# Patient Record
Sex: Male | Born: 1950 | Race: Black or African American | Hispanic: No | Marital: Single | Smoking: Never smoker
Health system: Southern US, Community
[De-identification: ages and names within clinical notes are randomized; demographics above are authoritative.]

## PROBLEM LIST (undated history)

## (undated) DIAGNOSIS — I10 Essential (primary) hypertension: Secondary | ICD-10-CM

## (undated) DIAGNOSIS — E669 Obesity, unspecified: Secondary | ICD-10-CM

## (undated) DIAGNOSIS — I82409 Acute embolism and thrombosis of unspecified deep veins of unspecified lower extremity: Secondary | ICD-10-CM

## (undated) DIAGNOSIS — E785 Hyperlipidemia, unspecified: Secondary | ICD-10-CM

## (undated) DIAGNOSIS — F0390 Unspecified dementia without behavioral disturbance: Secondary | ICD-10-CM

## (undated) DIAGNOSIS — F79 Unspecified intellectual disabilities: Secondary | ICD-10-CM

## (undated) HISTORY — DX: Acute embolism and thrombosis of unspecified deep veins of unspecified lower extremity: I82.409

## (undated) HISTORY — DX: Obesity, unspecified: E66.9

## (undated) HISTORY — DX: Unspecified intellectual disabilities: F79

## (undated) HISTORY — DX: Hyperlipidemia, unspecified: E78.5

## (undated) HISTORY — DX: Essential (primary) hypertension: I10

---

## 2007-07-10 ENCOUNTER — Ambulatory Visit: Payer: Self-pay | Admitting: Physician Assistant

## 2007-07-12 ENCOUNTER — Ambulatory Visit: Payer: Self-pay | Admitting: Cardiology

## 2007-07-17 ENCOUNTER — Encounter: Admission: RE | Admit: 2007-07-17 | Discharge: 2007-07-17 | Payer: Self-pay | Admitting: Internal Medicine

## 2007-07-28 ENCOUNTER — Ambulatory Visit: Payer: Self-pay | Admitting: Cardiology

## 2011-02-23 NOTE — Assessment & Plan Note (Signed)
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  The patient  referred for cardiac evaluation.  The patient was seen by Lorin Picket  recently.  The patient was found to have some ankle edema.  An  echocardiogram was ordered.  The echocardiograph study, however, was  within normal limits and there was no evidence of significant pulmonary  hypertension.  The ejection fraction was 55 to 60%.  The patient reports  to me that he has no substernal chest pain, he has no shortness of  breath, he has no orthopnea or PND.  He states that he is active in the  group home.  He plays occasional basketball and states that he does not  get short of breath.   MEDICATIONS:  1. Carvedilol 6.25 mg p.o. daily.  2. Lisinopril 20 mg p.o. daily.  3. Furosemide 40 mg p.o. daily.  4. Amlodipine 5 mg p.o. daily.  5. Aspirin 81 mg p.o. daily.  6. Iron 150 mg p.o. daily.   PHYSICAL EXAMINATION:  VITAL SIGNS:  Blood pressure is 113/79, heart  rate is 62 bpm, weight is 227 pounds.  NECK EXAM:  Normal carotid upstroke, no carotid bruits.  LUNGS:  Clear breath sounds bilaterally.  HEART:  Regular rate and rhythm, normal S1 and S2.  No murmurs, rubs or  gallops.  ABDOMEN:  Soft.  EXTREMITY EXAM:  No cyanosis or clubbing.  There is edema of the lower  extremities but this appears to be nonpitting.   PROBLEM LIST:  1. Nonpitting edema of the lower extremities, rule out venous      insufficiency.  2. Hypertension, controlled.  3. Normal left ventricular systolic function.   PLAN:  1. I may not change the patient's medical regimen at this point.  I do      not  think he has any major cardiac issues.  2. The patient's lower extremity edema appears to be nonpitting and      does not appear to be cardiac in nature.     Learta Codding, MD,FACC  Electronically Signed    GED/MedQ  DD: 07/28/2007  DT: 07/30/2007  Job #: [id number]   cc:   Prescott Parma

## 2011-02-23 NOTE — Assessment & Plan Note (Signed)
[facility]                          [facility] NOTE   NAME:Codrington, Nazaire                        MRN:          [id number]  DATE:07/10/2007                            DOB:          27-Aug-1951    REFERRING PHYSICIAN:  Prescott Parma   REFERRING PHYSICIAN:  Prescott Parma, M.D.   REASON FOR REFERRAL:  Cardiac evaluation.   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  Unfortunately, we do not know much about  his history.  He was recently staying with his mother and has been  transferred to [facility] in [place], [place].  He is  on multiple cardiac medications and does have peripheral edema.  The  group home is working on trying to get records and further information  on his past medical history.   The patient is a poor historian.  He answers no to most questions.  When questioned about chest pain, he denies.  He denies much shortness  of breath.  He does tell me that he walks quite a bit and also plays  basketball without any limitations.  He denies syncope or near syncope.  He denies PND or orthopnea.   PAST MEDICAL HISTORY:  Essentially unknown.   MEDICATIONS:  1. Carvedilol 6.25 mg daily.  2. Lisinopril 20 mg daily.  3. Furosemide 40 mg daily.  4. Amlodipine 5 mg daily.  5. Back lotion apply p.r.n.  6. Aspirin 81 mg daily.   ALLERGIES:  NO KNOWN DRUG ALLERGIES.   SOCIAL HISTORY:  Patient denies tobacco or alcohol abuse.   FAMILY HISTORY:  Essentially unknown.   REVIEW OF SYSTEMS:  Please see HPI.  Again, review of systems are  difficult to obtain.  He does deny fevers, chills, blood in his stools,  blood in his urine.  He does note that he does urinate quite frequently.  The rest of the review of systems are negative.   PHYSICAL EXAMINATION:  VITAL SIGNS:  Blood pressure 110/72,  pulse 83,  weight was not taken.  GENERAL APPEARANCE:  He is a well-nourished, well-developed man in no  acute distress.  HEENT:  Normal.  NECK:  Without JVD at 45 degrees.  LUNGS:  Clear to auscultation bilaterally without wheezing, rhonchi or  rales.  CARDIOVASCULAR:  Normal S1 and S2, regular rate and rhythm without  murmur.  ABDOMEN:  Soft and nontender with normal active bowel sounds, no  organomegaly.  EXTREMITIES:  There is 1 to 2+ ankle edema bilaterally.  Calves are soft  and nontender.  SKIN:  Warm and dry.  NEUROLOGIC:  He is alert and oriented x3.  Cranial nerves II-XII grossly  intact.  VASCULAR:  Carotid pulses without bruits bilaterally.   Electrocardiogram revealed sinus rhythm with a heart rate of 75, normal  axis, first degree AV block with a PR interval of 210 msec, no ischemic  changes.   IMPRESSION:  The patient is a 60 year old male patient with moderate to  severe mental retardation who has an unknown cardiac history but is on  multiple cardiac medications.  He does have peripheral edema on exam.  His lungs sound fairly clear.  There are no Q-waves on his  electrocardiogram.  At this point in time, records are still pending.   PLAN:  At this point in time we plan to try to obtain records on his  past medical history.  We will also send him over for an echocardiogram  to assess his left ventricular function and to rule out wall motion  abnormalities as well as valvular abnormalities.  Will try to obtain  labs from Dr. Rollen Sox office which were apparently drawn recently.  Will make sure that the patient's carvedilol is taken twice daily at his  group home.  I discussed the above assessment and plan today with Dr.  Andee Lineman who agreed.  I will bring him back in close follow-up with Dr.  Andee Lineman in the next couple of weeks.   At this point in time, I do not believe that the patient would be able  to tolerate stress testing or cardiac catheterization given his  mental  deficits.  He essentially is asymptomatic at this point in time.  We  will await the results of his echocardiogram  to see what his cardiac  status is before we make further decisions.      Tereso Newcomer, PA-C  Electronically Signed      Learta Codding, MD,FACC  Electronically Signed   SW/MedQ  DD: 07/10/2007  DT: 07/10/2007  Job #: [id number]

## 2011-07-09 ENCOUNTER — Ambulatory Visit (INDEPENDENT_AMBULATORY_CARE_PROVIDER_SITE_OTHER): Payer: Medicare Other | Admitting: Cardiovascular Disease

## 2011-07-09 ENCOUNTER — Other Ambulatory Visit: Payer: Self-pay | Admitting: Cardiovascular Disease

## 2011-07-09 ENCOUNTER — Encounter: Payer: Self-pay | Admitting: *Deleted

## 2011-07-09 VITALS — BP 90/72 | HR 94 | Ht 72.0 in | Wt 201.0 lb

## 2011-07-09 DIAGNOSIS — R609 Edema, unspecified: Secondary | ICD-10-CM

## 2011-07-09 DIAGNOSIS — I10 Essential (primary) hypertension: Secondary | ICD-10-CM | POA: Insufficient documentation

## 2011-07-09 DIAGNOSIS — R600 Localized edema: Secondary | ICD-10-CM | POA: Insufficient documentation

## 2011-07-09 NOTE — Progress Notes (Signed)
HPI  This is a 60 year old male who is referred for evaluation of lower extremity edema. The patient had significant mental retardation and dementia. He was seen in the past by Dr. Andee Lineman in 2008 for the same problem. He had an echocardiogram done at that time which showed normal LV systolic function and no evidence of pulmonary hypertension. The patient is not able to provide any history. According to the caregiver, his dementia has actually worsened and he is in the process to be moved to an institution instead of a group home. The patient had a swelling in both legs for many years but worse on the right side. It's continuous and slightly worse at the end of the day. There's reported previous history of right leg DVT without much details. The patient himself denies any chest pain or dyspnea.  No Known Allergies   No current outpatient prescriptions on file prior to visit.     Past Medical History  Diagnosis Date  . Mental retardation   . DVT (deep venous thrombosis)     right leg  . Dyslipidemia   . Obesity   . Hypertension      History reviewed. No pertinent past surgical history.   Family History  Problem Relation Age of Onset  . Hypertension       History   Social History  . Marital Status: Single    Spouse Name: N/A    Number of Children: N/A  . Years of Education: N/A   Occupational History  . Not on file.   Social History Main Topics  . Smoking status: Never Smoker   . Smokeless tobacco: Never Used  . Alcohol Use: Not on file  . Drug Use: Not on file  . Sexually Active: Not on file   Other Topics Concern  . Not on file   Social History Narrative  . No narrative on file     ROS Unable to obtain due to his dementia.  PHYSICAL EXAM   BP 90/72  Pulse 94  Ht 6' (1.829 m)  Wt 201 lb (91.173 kg)  BMI 27.26 kg/m2  SpO2 95%  Constitutional: He is oriented to person, place, and time. He appears well-developed and well-nourished. No distress.  HENT:  No nasal discharge.  Head: Normocephalic and atraumatic.  Eyes: Pupils are equal, round, and reactive to light. Right eye exhibits no discharge. Left eye exhibits no discharge.  Neck: Normal range of motion. Neck supple. No JVD present. No thyromegaly present.  Cardiovascular: Normal rate, regular rhythm, normal heart sounds and intact distal pulses. Exam reveals no gallop and no friction rub.  No murmur heard.  Pulmonary/Chest: Effort normal and breath sounds normal. No stridor. No respiratory distress. He has no wheezes. He has no rales. He exhibits no tenderness.  Abdominal: Soft. Bowel sounds are normal. He exhibits no distension. There is no tenderness. There is no rebound and no guarding.  Musculoskeletal: Normal range of motion. There is +2 edema with pitting and nonpitting component worse on the right side and associated with significant stasis dermatitis. No significant tenderness.  Neurological: He is alert and oriented to person, place, and time. Coordination normal.  Skin: Skin is warm and dry. No rash noted. He is not diaphoretic. No erythema. No pallor.  Psychiatric: He has a normal mood and affect. His behavior is normal. Judgment and thought content normal.      EKG: Normal sinus rhythm with no significant ST or T wave changes.   ASSESSMENT AND PLAN

## 2011-07-09 NOTE — Patient Instructions (Addendum)
Your follow up will be based on your test results.  Your physician recommends that you continue on your current medications as directed. Please refer to the Current Medication list given to you today. Your physician has requested that you have an echocardiogram. Echocardiography is a painless test that uses sound waves to create images of your heart. It provides your doctor with information about the size and shape of your heart and how well your heart's chambers and valves are working. This procedure takes approximately one hour. There are no restrictions for this procedure. Your physician has requested that you have a lower or upper extremity venous duplex. This test is an ultrasound of the veins in the legs or arms. It looks at venous blood flow that carries blood from the heart to the legs or arms. Allow one hour for a Lower Venous exam. Allow thirty minutes for an Upper Venous exam. There are no restrictions or special instructions. Support stockings-Do not wear these until after ultrasound of legs is complete. If the results of your test are normal or stable, you will receive a letter. If they are abnormal, the nurse will contact you by phone.

## 2011-07-09 NOTE — Assessment & Plan Note (Signed)
The patient has bilateral leg edema worse on the right side than the left with significant stasis dermatitis. I suspect chronic venous insufficiency. However, due to his limited mobility I think we need to rule out chronic DVTs. Thus, I recommend bilateral venous ultrasound Doppler for further evaluation. I will also obtain an echocardiogram to evaluate his LV systolic function and pulmonary pressure. If these 2 tests do not show significant abnormalities, I recommend knee-high support stockings with frequent leg elevation during the day. The patient does not report any other cardiac symptoms and there is no indication for ischemic cardiac evaluation.

## 2011-07-09 NOTE — Assessment & Plan Note (Signed)
His blood pressure is somewhat low but he does not report any symptoms.

## 2011-07-22 ENCOUNTER — Other Ambulatory Visit (INDEPENDENT_AMBULATORY_CARE_PROVIDER_SITE_OTHER): Payer: Medicare Other | Admitting: *Deleted

## 2011-07-22 DIAGNOSIS — R609 Edema, unspecified: Secondary | ICD-10-CM

## 2011-07-27 ENCOUNTER — Telehealth: Payer: Self-pay | Admitting: *Deleted

## 2011-07-27 NOTE — Telephone Encounter (Signed)
Message copied by Arlyss Gandy on Tue Jul 27, 2011  3:40 PM ------      Message from: Lorine Bears A      Created: Mon Jul 26, 2011 11:23 AM       echo was fine but he should come for a follow up due to abnormal venous ultrasound doppler

## 2011-07-27 NOTE — Telephone Encounter (Signed)
Emma at [facility] notified and given appt info for f/u of venous study.

## 2011-08-03 ENCOUNTER — Encounter: Payer: Self-pay | Admitting: Cardiovascular Disease

## 2011-08-03 ENCOUNTER — Ambulatory Visit (INDEPENDENT_AMBULATORY_CARE_PROVIDER_SITE_OTHER): Payer: Medicare Other | Admitting: Cardiovascular Disease

## 2011-08-03 VITALS — BP 114/81 | HR 70 | Ht 72.0 in | Wt 200.0 lb

## 2011-08-03 DIAGNOSIS — R609 Edema, unspecified: Secondary | ICD-10-CM

## 2011-08-03 DIAGNOSIS — R600 Localized edema: Secondary | ICD-10-CM

## 2011-08-03 NOTE — Assessment & Plan Note (Signed)
This seems to be do to chronic venous insufficiency as well as partial venous obstruction due to previous DVT. The patient was treated in the past with warfarin. I recommend aspirin 325 mg once daily. I also recommend leg elevation for at least 20 minutes 3 times a day. Due to residual nonobstructive thrombus in the right lower extremity, I don't recommend support stockings. The patient can followup as needed.

## 2011-08-03 NOTE — Patient Instructions (Signed)
   Follow up as needed. Your physician recommends that you continue on your current medications as directed. Please refer to the Current Medication list given to you today. 

## 2011-08-03 NOTE — Progress Notes (Signed)
HPI  This is a 60 year old gentleman who is here today for followup visit. He was seen recently for lower extremity edema which was worse on the right side. The patient had DVT in his right leg in the past and was treated with warfarin. He underwent evaluation with an echocardiogram which showed normal LV systolic function without significant valvular abnormalities and no evidence of pulmonary hypertension. 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 This by itself would not require any specific intervention other than daily aspirin. He had bilateral lower extremity venous ultrasound Doppler which was normal on the left side. On the right side, there was extensive nonocclusive old thrombus in the femoral and popliteal veins. This was consistent with his previous DVT. His lower extremity edema is better today.  No Known Allergies   Current Outpatient Prescriptions on File Prior to Visit  Medication Sig Dispense Refill  . ammonium lactate (LAC-HYDRIN) 12 % lotion Apply 1 application topically as needed.        . ARIPiprazole (ABILIFY) 5 MG tablet Take 5 mg by mouth 3 (three) times daily.        . Cholecalciferol (VITAMIN D3) 1000 UNITS CAPS Take 1 capsule by mouth 2 (two) times daily.        Marland Kitchen donepezil (ARICEPT) 10 MG tablet Take 10 mg by mouth at bedtime.       . ferrous sulfate 325 (65 FE) MG tablet Take 325 mg by mouth daily with breakfast.        . furosemide (LASIX) 20 MG tablet Take 20 mg by mouth daily.        Marland Kitchen gabapentin (NEURONTIN) 100 MG capsule Take 100 mg by mouth at bedtime.        Marland Kitchen lisinopril (PRINIVIL,ZESTRIL) 10 MG tablet Take 10 mg by mouth daily.        . memantine (NAMENDA) 10 MG tablet Take 10 mg by mouth 2 (two) times daily.        . Omega-3 Fatty Acids (FISH OIL) 1200 MG CAPS Take 1 capsule by mouth daily.        . potassium chloride (KLOR-CON) 10 MEQ CR tablet Take 10 mEq by mouth daily.           Past Medical History  Diagnosis Date  . Mental  retardation   . DVT (deep venous thrombosis)     right leg  . Dyslipidemia   . Obesity   . Hypertension      History reviewed. No pertinent past surgical history.   Family History  Problem Relation Age of Onset  . Hypertension       History   Social History  . Marital Status: Single    Spouse Name: N/A    Number of Children: N/A  . Years of Education: N/A   Occupational History  . Not on file.   Social History Main Topics  . Smoking status: Never Smoker   . Smokeless tobacco: Never Used  . Alcohol Use: Not on file  . Drug Use: Not on file  . Sexually Active: Not on file   Other Topics Concern  . Not on file   Social History Narrative  . No narrative on file     PHYSICAL EXAM   BP 114/81  Pulse 70  Ht 6' (1.829 m)  Wt 200 lb (90.719 kg)  BMI 27.12 kg/m2  SpO2 97%  HENT: No nasal discharge.  Head: Normocephalic and atraumatic.  Eyes: Pupils are equal, round, and  reactive to light. Right eye exhibits no discharge. Left eye exhibits no discharge.  Neck: Normal range of motion. Neck supple. No JVD present. No thyromegaly present.  Cardiovascular: Normal rate, regular rhythm, normal heart sounds . Exam reveals no gallop and no friction rub.  No murmur heard.  Pulmonary/Chest: Effort normal and breath sounds normal. No stridor. No respiratory distress. He has no wheezes. He has no rales. He exhibits no tenderness.  Abdominal: Soft. Bowel sounds are normal. He exhibits no distension. There is no tenderness. There is no rebound and no guarding.  Musculoskeletal: Normal range of motion. He exhibits +2 edema mostly nonpitting and worse on the right side and no tenderness.  Neurological: He is alert and oriented to person, place, and time. Coordination normal.  Skin: Skin is warm and dry. Stasis dermatitis in the lower extremities.Marland Kitchen He is not diaphoretic. No erythema. No pallor.         ASSESSMENT AND PLAN

## 2011-12-30 ENCOUNTER — Other Ambulatory Visit: Payer: Self-pay

## 2011-12-30 ENCOUNTER — Emergency Department (HOSPITAL_COMMUNITY)
Admission: EM | Admit: 2011-12-30 | Discharge: 2011-12-31 | Disposition: A | Discharge status: Home / Self Care | Payer: Medicare Other | Attending: Emergency Medicine | Admitting: Emergency Medicine

## 2011-12-30 ENCOUNTER — Emergency Department (HOSPITAL_COMMUNITY): Payer: Medicare Other

## 2011-12-30 ENCOUNTER — Encounter (HOSPITAL_COMMUNITY): Payer: Self-pay | Admitting: Emergency Medicine

## 2011-12-30 DIAGNOSIS — F79 Unspecified intellectual disabilities: Secondary | ICD-10-CM | POA: Insufficient documentation

## 2011-12-30 DIAGNOSIS — I10 Essential (primary) hypertension: Secondary | ICD-10-CM | POA: Insufficient documentation

## 2011-12-30 DIAGNOSIS — R4182 Altered mental status, unspecified: Secondary | ICD-10-CM | POA: Insufficient documentation

## 2011-12-30 DIAGNOSIS — Q619 Cystic kidney disease, unspecified: Secondary | ICD-10-CM | POA: Insufficient documentation

## 2011-12-30 DIAGNOSIS — F603 Borderline personality disorder: Secondary | ICD-10-CM | POA: Insufficient documentation

## 2011-12-30 DIAGNOSIS — D649 Anemia, unspecified: Secondary | ICD-10-CM | POA: Insufficient documentation

## 2011-12-30 DIAGNOSIS — E785 Hyperlipidemia, unspecified: Secondary | ICD-10-CM | POA: Insufficient documentation

## 2011-12-30 DIAGNOSIS — N390 Urinary tract infection, site not specified: Secondary | ICD-10-CM

## 2011-12-30 DIAGNOSIS — Z86718 Personal history of other venous thrombosis and embolism: Secondary | ICD-10-CM | POA: Insufficient documentation

## 2011-12-30 DIAGNOSIS — IMO0002 Reserved for concepts with insufficient information to code with codable children: Secondary | ICD-10-CM | POA: Insufficient documentation

## 2011-12-30 LAB — URINE MICROSCOPIC-ADD ON

## 2011-12-30 LAB — URINALYSIS, ROUTINE W REFLEX MICROSCOPIC
Leukocytes, UA: NEGATIVE
Nitrite: NEGATIVE
Specific Gravity, Urine: 1.025 (ref 1.005–1.030)
pH: 6.5 (ref 5.0–8.0)

## 2011-12-30 LAB — DIFFERENTIAL
Eosinophils Absolute: 0.1 10*3/uL (ref 0.0–0.7)
Eosinophils Relative: 1 % (ref 0–5)
Lymphs Abs: 1.8 10*3/uL (ref 0.7–4.0)

## 2011-12-30 LAB — COMPREHENSIVE METABOLIC PANEL
ALT: 10 U/L (ref 0–53)
BUN: 19 mg/dL (ref 6–23)
Calcium: 9.5 mg/dL (ref 8.4–10.5)
GFR calc Af Amer: 80 mL/min — ABNORMAL LOW (ref >90)
Glucose, Bld: 82 mg/dL (ref 70–99)
Sodium: 135 mEq/L (ref 135–145)
Total Protein: 7.1 g/dL (ref 6.0–8.3)

## 2011-12-30 LAB — CBC
MCH: 29.7 pg (ref 26.0–34.0)
MCV: 89 fL (ref 78.0–100.0)
Platelets: 233 10*3/uL (ref 150–400)
RBC: 3.91 MIL/uL — ABNORMAL LOW (ref 4.22–5.81)
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
## 2011-12-30 NOTE — ED Notes (Signed)
Patient transported to CT 

## 2011-12-30 NOTE — ED Notes (Signed)
BJY:[id number]<FA> Expected date:<BR> Expected time: 6:02 PM<BR> Means of arrival:<BR> Comments:<BR> ROCK6 - 61yoM Psych eval, uncooperative with EMS
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
## 2011-12-31 MED ORDER — CEPHALEXIN 500 MG PO CAPS: 500.0000 mg | ORAL_CAPSULE | Freq: Four times a day (QID) | ORAL | Status: AC

## 2011-12-31 MED ORDER — DEXTROSE 5 % IV SOLN
1.0000 g | Freq: Once | INTRAVENOUS | Status: AC
  Administered 2011-12-31: 1 g via INTRAVENOUS
  Filled 2011-12-31: qty 10

## 2011-12-31 NOTE — Discharge Instructions (Signed)

## 2011-12-31 NOTE — ED Notes (Signed)
Waiting on Rocephin IV to complete before discharging patient.
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
## 2012-01-01 LAB — URINE CULTURE

## 2014-06-18 ENCOUNTER — Emergency Department (HOSPITAL_COMMUNITY): Payer: Medicare Other

## 2014-06-18 ENCOUNTER — Inpatient Hospital Stay (HOSPITAL_COMMUNITY)
Admission: EM | Admit: 2014-06-18 | Discharge: 2014-06-21 | DRG: 391 | Disposition: A | Discharge status: Nursing Home (Includes ICF) | Payer: Medicare Other | Attending: Internal Medicine | Admitting: Internal Medicine

## 2014-06-18 ENCOUNTER — Encounter (HOSPITAL_COMMUNITY): Payer: Self-pay | Admitting: Radiology

## 2014-06-18 DIAGNOSIS — Z79899 Other long term (current) drug therapy: Secondary | ICD-10-CM | POA: Diagnosis not present

## 2014-06-18 DIAGNOSIS — R1110 Vomiting, unspecified: Secondary | ICD-10-CM | POA: Diagnosis present

## 2014-06-18 DIAGNOSIS — F79 Unspecified intellectual disabilities: Secondary | ICD-10-CM | POA: Diagnosis present

## 2014-06-18 DIAGNOSIS — K5289 Other specified noninfective gastroenteritis and colitis: Secondary | ICD-10-CM | POA: Diagnosis present

## 2014-06-18 DIAGNOSIS — F0390 Unspecified dementia without behavioral disturbance: Secondary | ICD-10-CM | POA: Diagnosis present

## 2014-06-18 DIAGNOSIS — R1115 Cyclical vomiting syndrome unrelated to migraine: Secondary | ICD-10-CM

## 2014-06-18 DIAGNOSIS — R109 Unspecified abdominal pain: Secondary | ICD-10-CM | POA: Diagnosis not present

## 2014-06-18 DIAGNOSIS — I10 Essential (primary) hypertension: Secondary | ICD-10-CM | POA: Diagnosis present

## 2014-06-18 DIAGNOSIS — G9340 Encephalopathy, unspecified: Secondary | ICD-10-CM | POA: Diagnosis present

## 2014-06-18 DIAGNOSIS — K529 Noninfective gastroenteritis and colitis, unspecified: Secondary | ICD-10-CM | POA: Diagnosis present

## 2014-06-18 DIAGNOSIS — D638 Anemia in other chronic diseases classified elsewhere: Secondary | ICD-10-CM | POA: Diagnosis present

## 2014-06-18 DIAGNOSIS — E785 Hyperlipidemia, unspecified: Secondary | ICD-10-CM | POA: Diagnosis present

## 2014-06-18 DIAGNOSIS — R112 Nausea with vomiting, unspecified: Secondary | ICD-10-CM

## 2014-06-18 DIAGNOSIS — Z86718 Personal history of other venous thrombosis and embolism: Secondary | ICD-10-CM

## 2014-06-18 DIAGNOSIS — D649 Anemia, unspecified: Secondary | ICD-10-CM

## 2014-06-18 HISTORY — DX: Unspecified dementia without behavioral disturbance: F03.90

## 2014-06-18 HISTORY — DX: Hyperlipidemia, unspecified: E78.5

## 2014-06-18 LAB — COMPREHENSIVE METABOLIC PANEL
ALBUMIN: 3.7 g/dL (ref 3.5–5.2)
ALK PHOS: 69 U/L (ref 39–117)
ALT: 8 U/L (ref 0–53)
AST: 16 U/L (ref 0–37)
Anion gap: 15 (ref 5–15)
BUN: 24 mg/dL — ABNORMAL HIGH (ref 6–23)
CALCIUM: 9.5 mg/dL (ref 8.4–10.5)
CO2: 23 mEq/L (ref 19–32)
Chloride: 100 mEq/L (ref 96–112)
Creatinine, Ser: 1.22 mg/dL (ref 0.50–1.35)
GFR calc non Af Amer: 62 mL/min — ABNORMAL LOW (ref >90)
GFR, EST AFRICAN AMERICAN: 72 mL/min — AB (ref >90)
Glucose, Bld: 81 mg/dL (ref 70–99)
POTASSIUM: 4.7 meq/L (ref 3.7–5.3)
SODIUM: 138 meq/L (ref 137–147)
TOTAL PROTEIN: 7.2 g/dL (ref 6.0–8.3)
Total Bilirubin: 0.3 mg/dL (ref 0.3–1.2)

## 2014-06-18 LAB — CBC WITH DIFFERENTIAL/PLATELET
BASOS ABS: 0 10*3/uL (ref 0.0–0.1)
BASOS PCT: 0 % (ref 0–1)
EOS ABS: 0 10*3/uL (ref 0.0–0.7)
EOS PCT: 1 % (ref 0–5)
HCT: 37.8 % — ABNORMAL LOW (ref 39.0–52.0)
Hemoglobin: 12.3 g/dL — ABNORMAL LOW (ref 13.0–17.0)
Lymphocytes Relative: 26 % (ref 12–46)
Lymphs Abs: 2.1 10*3/uL (ref 0.7–4.0)
MCH: 29.6 pg (ref 26.0–34.0)
MCHC: 32.5 g/dL (ref 30.0–36.0)
MCV: 91.1 fL (ref 78.0–100.0)
Monocytes Absolute: 1 10*3/uL (ref 0.1–1.0)
Monocytes Relative: 12 % (ref 3–12)
NEUTROS PCT: 61 % (ref 43–77)
Neutro Abs: 5.2 10*3/uL (ref 1.7–7.7)
PLATELETS: 148 10*3/uL — AB (ref 150–400)
RBC: 4.15 MIL/uL — AB (ref 4.22–5.81)
RDW: 15.2 % (ref 11.5–15.5)
WBC: 8.3 10*3/uL (ref 4.0–10.5)

## 2014-06-18 LAB — URINALYSIS, ROUTINE W REFLEX MICROSCOPIC
BILIRUBIN URINE: NEGATIVE
Glucose, UA: NEGATIVE mg/dL
Ketones, ur: NEGATIVE mg/dL
Leukocytes, UA: NEGATIVE
NITRITE: NEGATIVE
PH: 5.5 (ref 5.0–8.0)
Protein, ur: NEGATIVE mg/dL
SPECIFIC GRAVITY, URINE: 1.013 (ref 1.005–1.030)
UROBILINOGEN UA: 0.2 mg/dL (ref 0.0–1.0)

## 2014-06-18 LAB — URINE MICROSCOPIC-ADD ON

## 2014-06-18 LAB — TSH: TSH: 1.59 u[IU]/mL (ref 0.350–4.500)

## 2014-06-18 MED ORDER — IOHEXOL 300 MG/ML  SOLN
100.0000 mL | Freq: Once | INTRAMUSCULAR | Status: AC | PRN
Start: 2014-06-18 — End: 2014-06-18
  Administered 2014-06-18: 100 mL via INTRAVENOUS

## 2014-06-18 MED ORDER — SODIUM CHLORIDE 0.9 % IV SOLN
INTRAVENOUS | Status: DC
  Administered 2014-06-18 – 2014-06-20 (×4): via INTRAVENOUS

## 2014-06-18 MED ORDER — ONDANSETRON HCL 4 MG PO TABS: 4.0000 mg | ORAL_TABLET | Freq: Four times a day (QID) | ORAL | Status: DC | PRN

## 2014-06-18 MED ORDER — ACETAMINOPHEN 650 MG RE SUPP: 650.0000 mg | Freq: Four times a day (QID) | RECTAL | Status: DC | PRN

## 2014-06-18 MED ORDER — ONDANSETRON HCL 4 MG/2ML IJ SOLN: 4.0000 mg | Freq: Four times a day (QID) | INTRAMUSCULAR | Status: DC | PRN

## 2014-06-18 MED ORDER — SODIUM CHLORIDE 0.9 % IV SOLN
INTRAVENOUS | Status: AC
  Administered 2014-06-18: 19:00:00 via INTRAVENOUS

## 2014-06-18 MED ORDER — HYDROCERIN EX CREA
1.0000 "application " | TOPICAL_CREAM | Freq: Every day | CUTANEOUS | Status: DC
  Administered 2014-06-19 – 2014-06-21 (×3): 1 via TOPICAL
  Filled 2014-06-18: qty 113

## 2014-06-18 MED ORDER — RISPERIDONE 1 MG PO TABS
1.0000 mg | ORAL_TABLET | Freq: Every day | ORAL | Status: DC
  Administered 2014-06-19 – 2014-06-20 (×2): 1 mg via ORAL
  Filled 2014-06-18 (×4): qty 1

## 2014-06-18 MED ORDER — MEMANTINE HCL 10 MG PO TABS
10.0000 mg | ORAL_TABLET | Freq: Two times a day (BID) | ORAL | Status: DC
  Administered 2014-06-19 – 2014-06-21 (×5): 10 mg via ORAL
  Filled 2014-06-18 (×7): qty 1

## 2014-06-18 MED ORDER — OMEGA-3-ACID ETHYL ESTERS 1 G PO CAPS
1.0000 g | ORAL_CAPSULE | Freq: Every day | ORAL | Status: DC
  Administered 2014-06-19 – 2014-06-21 (×3): 1 g via ORAL
  Filled 2014-06-18 (×4): qty 1

## 2014-06-18 MED ORDER — FUROSEMIDE 40 MG PO TABS
40.0000 mg | ORAL_TABLET | Freq: Every day | ORAL | Status: DC
  Administered 2014-06-19 – 2014-06-21 (×3): 40 mg via ORAL
  Filled 2014-06-18 (×3): qty 1

## 2014-06-18 MED ORDER — DONEPEZIL HCL 10 MG PO TABS
10.0000 mg | ORAL_TABLET | Freq: Every day | ORAL | Status: DC
  Administered 2014-06-19 – 2014-06-20 (×2): 10 mg via ORAL
  Filled 2014-06-18 (×4): qty 1

## 2014-06-18 MED ORDER — LISINOPRIL 10 MG PO TABS
10.0000 mg | ORAL_TABLET | Freq: Every day | ORAL | Status: DC
  Administered 2014-06-19 – 2014-06-21 (×3): 10 mg via ORAL
  Filled 2014-06-18 (×3): qty 1

## 2014-06-18 MED ORDER — VITAMIN D 1000 UNITS PO TABS
1000.0000 [IU] | ORAL_TABLET | Freq: Two times a day (BID) | ORAL | Status: DC
  Administered 2014-06-19 – 2014-06-21 (×5): 1000 [IU] via ORAL
  Filled 2014-06-18 (×7): qty 1

## 2014-06-18 MED ORDER — SODIUM CHLORIDE 0.9 % IV SOLN
INTRAVENOUS | Status: DC
Start: 2014-06-18 — End: 2014-06-18

## 2014-06-18 MED ORDER — GABAPENTIN 100 MG PO CAPS
100.0000 mg | ORAL_CAPSULE | Freq: Every day | ORAL | Status: DC
  Administered 2014-06-19 – 2014-06-20 (×2): 100 mg via ORAL
  Filled 2014-06-18 (×4): qty 1

## 2014-06-18 MED ORDER — LORAZEPAM 0.5 MG PO TABS: 0.5000 mg | ORAL_TABLET | ORAL | Status: DC | PRN

## 2014-06-18 MED ORDER — ONDANSETRON HCL 4 MG/2ML IJ SOLN: 4.0000 mg | Freq: Three times a day (TID) | INTRAMUSCULAR | Status: DC | PRN

## 2014-06-18 MED ORDER — IOHEXOL 300 MG/ML  SOLN
50.0000 mL | Freq: Once | INTRAMUSCULAR | Status: AC | PRN
  Administered 2014-06-18: 50 mL via ORAL

## 2014-06-18 MED ORDER — SODIUM CHLORIDE 0.9 % IV BOLUS (SEPSIS)
1000.0000 mL | Freq: Once | INTRAVENOUS | Status: AC
  Administered 2014-06-18: 1000 mL via INTRAVENOUS

## 2014-06-18 MED ORDER — RISPERIDONE 2 MG PO TABS
2.0000 mg | ORAL_TABLET | Freq: Every day | ORAL | Status: DC
  Administered 2014-06-19 – 2014-06-21 (×3): 2 mg via ORAL
  Filled 2014-06-18 (×3): qty 1

## 2014-06-18 MED ORDER — ONDANSETRON HCL 4 MG/2ML IJ SOLN
4.0000 mg | Freq: Once | INTRAMUSCULAR | Status: AC
  Administered 2014-06-18: 4 mg via INTRAVENOUS
  Filled 2014-06-18: qty 2

## 2014-06-18 MED ORDER — FERROUS SULFATE 325 (65 FE) MG PO TABS
325.0000 mg | ORAL_TABLET | Freq: Every day | ORAL | Status: DC
  Administered 2014-06-20 – 2014-06-21 (×2): 325 mg via ORAL
  Filled 2014-06-18 (×5): qty 1

## 2014-06-18 MED ORDER — ACETAMINOPHEN 325 MG PO TABS: 650.0000 mg | ORAL_TABLET | Freq: Four times a day (QID) | ORAL | Status: DC | PRN

## 2014-06-18 MED ORDER — ARIPIPRAZOLE 15 MG PO TABS
30.0000 mg | ORAL_TABLET | Freq: Every day | ORAL | Status: DC
  Administered 2014-06-19 – 2014-06-21 (×3): 30 mg via ORAL
  Filled 2014-06-18 (×3): qty 2

## 2014-06-18 NOTE — ED Notes (Signed)
Pt denies any complaints or pain, pt resting in bed at this time

## 2014-06-18 NOTE — ED Notes (Signed)
Bed: WA17 Expected date:  Expected time:  Means of arrival:  Comments: ems- elderly, MR, "lethargic" but responding appropriately to ems

## 2014-06-18 NOTE — ED Provider Notes (Signed)
CSN: [id number]     Arrival date & time 06/18/14  1048 History   First MD Initiated Contact with Patient 06/18/14 1105     Chief Complaint  Patient presents with  . sent for eval   . Emesis   Level V caveat for mental retardation  (Consider location/radiation/quality/duration/timing/severity/associated sxs/prior Treatment) HPI Patient was sent to the ED by his nursing facility after having an episode of vomiting this morning. Per them patient is not acting like his usual self. Patient is noted to be drooling. He is verbal however his speech is hard to understand. He basically likes to answer yes or no questions but it's hard to tell his yes from his no. He does indicate he is nauseated and possibly has pain in his abdomen.  PCP Dr Virgina Organ  Past Medical History  Diagnosis Date  . Mental retardation   . DVT (deep venous thrombosis)     right leg  . Dyslipidemia   . Obesity   . Hypertension    No past surgical history on file. Family History  Problem Relation Age of Onset  . Hypertension     History  Substance Use Topics  . Smoking status: Never Smoker   . Smokeless tobacco: Never Used  . Alcohol Use: Not on file  lives in nursing facility  Review of Systems  All other systems reviewed and are negative.     Allergies  Review of patient's allergies indicates no known allergies.  Home Medications   Prior to Admission medications   Medication Sig Start Date End Date Taking? Authorizing Provider  ARIPiprazole (ABILIFY) 30 MG tablet Take 30 mg by mouth daily.   Yes Historical Provider, MD  Cholecalciferol (VITAMIN D3) 1000 UNITS CAPS Take 1 capsule by mouth 2 (two) times daily.     Yes Historical Provider, MD  donepezil (ARICEPT) 10 MG tablet Take 10 mg by mouth at bedtime.    Yes Historical Provider, MD  ferrous sulfate 325 (65 FE) MG tablet Take 325 mg by mouth daily with breakfast.     Yes Historical Provider, MD  furosemide (LASIX) 40 MG tablet Take 40 mg by mouth  daily.   Yes Historical Provider, MD  gabapentin (NEURONTIN) 100 MG capsule Take 100 mg by mouth at bedtime.     Yes Historical Provider, MD  lisinopril (PRINIVIL,ZESTRIL) 10 MG tablet Take 10 mg by mouth daily.     Yes Historical Provider, MD  LORazepam (ATIVAN) 0.5 MG tablet Take 0.5 mg by mouth every 4 (four) hours as needed (for agitation).   Yes Historical Provider, MD  memantine (NAMENDA) 10 MG tablet Take 10 mg by mouth 2 (two) times daily.     Yes Historical Provider, MD  Omega-3 Fatty Acids (FISH OIL) 1200 MG CAPS Take 1 capsule by mouth daily.     Yes Historical Provider, MD  potassium chloride (KLOR-CON) 10 MEQ CR tablet Take 10 mEq by mouth daily.     Yes Historical Provider, MD  risperiDONE (RISPERDAL) 1 MG tablet Take 1 mg by mouth at bedtime.   Yes Historical Provider, MD  risperiDONE (RISPERDAL) 2 MG tablet Take 2 mg by mouth daily with breakfast.   Yes Historical Provider, MD  Skin Protectants, Misc. (EUCERIN) cream Apply 1 application topically daily.   Yes Historical Provider, MD   BP 123/80  Pulse 92  Temp(Src) 98.3 F (36.8 C) (Oral)  Resp 19  SpO2 100%  Vital signs normal   Physical Exam  Nursing note and vitals  reviewed. Constitutional: He appears well-developed and well-nourished.  Non-toxic appearance. He does not appear ill. No distress.  Pt appears mentally slow, his speech is hard to understand  HENT:  Head: Normocephalic and atraumatic.  Right Ear: External ear normal.  Left Ear: External ear normal.  Nose: Nose normal. No mucosal edema or rhinorrhea.  Mouth/Throat: Oropharynx is clear and moist and mucous membranes are normal. No dental abscesses or uvula swelling.  drooling  Eyes: Conjunctivae and EOM are normal. Pupils are equal, round, and reactive to light.  Neck: Normal range of motion and full passive range of motion without pain. Neck supple.  Cardiovascular: Normal rate, regular rhythm and normal heart sounds.  Exam reveals no gallop and no  friction rub.   No murmur heard. Pulmonary/Chest: Effort normal and breath sounds normal. No respiratory distress. He has no wheezes. He has no rhonchi. He has no rales. He exhibits no tenderness and no crepitus.  Abdominal: Soft. Normal appearance and bowel sounds are normal. He exhibits no distension. There is tenderness. There is no rebound and no guarding.  Pt has some tenderness diffusely  Musculoskeletal: Normal range of motion. He exhibits no edema and no tenderness.  Moves all extremities well.   Neurological: He is alert. He has normal strength. No cranial nerve deficit.  Skin: Skin is warm, dry and intact. No rash noted. No erythema. No pallor.  Psychiatric: He has a normal mood and affect. His speech is normal and behavior is normal. His mood appears not anxious.    ED Course  Procedures (including critical care time)  Medications  0.9 %  sodium chloride infusion ( Intravenous New Bag/Given 06/18/14 1331)  sodium chloride 0.9 % bolus 1,000 mL (0 mLs Intravenous Stopped 06/18/14 1330)  ondansetron (ZOFRAN) injection 4 mg (4 mg Intravenous Given 06/18/14 1159)  iohexol (OMNIPAQUE) 300 MG/ML solution 50 mL (50 mLs Oral Contrast Given 06/18/14 1317)  iohexol (OMNIPAQUE) 300 MG/ML solution 100 mL (100 mLs Intravenous Contrast Given 06/18/14 1431)   No family has been in with this patient.   15:45 Dr Johna Sheriff has reviewed his CT scan, he does not feel the patient has a surgical appendicitis. He doesn't need to see the patient unless his condition gets worse.  16:34 Dr Elisabeth Pigeon admit to observation, team 8    Patient presents with changing his usual behavior. Patient has baseline mental retardation and dementia. He has had vomiting and noticeable drooling light in the ED. He appears to have some abdominal pain however it is unclear where he is hurting. Patient has a CT scan that has enlarged but not inflamed appendix. Surgery does not feel like he has appendicitis (normal white count, nonfocal  exam), patient being admitted for observation overnight to make sure his condition does not get worse.  Labs Review   Imaging Review Ct Abdomen Pelvis W Contrast  06/18/2014   CLINICAL DATA:  Vomiting episode this morning. Depressed mental status.  EXAM: CT ABDOMEN AND PELVIS WITH CONTRAST  TECHNIQUE: Multidetector CT imaging of the abdomen and pelvis was performed using the standard protocol following bolus administration of intravenous contrast.  CONTRAST:  50mL OMNIPAQUE IOHEXOL 300 MG/ML SOLN, OMNIPAQUE IOHEXOL 300 MG/ML SOLN  COMPARISON:  12/30/2011  FINDINGS: In the left mid abdomen there are loops of small bowel which show mild wall thickening. Minor stranding is seen in the adjacent mesenteric. There is no wall air or extraluminal air. There is no evidence of obstruction. The remainder of the small bowel is unremarkable.  Colon is unremarkable. The appendix is visualized. It measures mildly thickened, just under 8 mm, but the wall appears normal thickness and areas appendiceal air. This is most likely a normal variant for this patient.  Lung bases show a minimal right effusion and dependent coarse reticular type opacity most consistent with atelectasis, scarring or a combination. Calcified plaque is noted along the right diaphragmatic surface. Heart is normal in size.  Liver shows a stable low-density lesion and dome consistent with a cyst. Liver otherwise unremarkable.  Spleen, pancreas and gallbladder are unremarkable. No bile duct dilation no adrenal masses.  Multiple low-density renal lesions are noted consistent with cysts. There are single nonobstructing stones in the upper poles of each kidney. There is no hydronephrosis. Normal ureters. Bladder is unremarkable.  No pathologically enlarged lymph nodes. There is no ascites. Vena cava filter has its superior margin just below the right renal vein.  Prominent varicosities are noted in the subcutaneous soft tissues of the anterior, inferior  abdominal wall.  There are advanced arthropathic changes of the right hip with significant, but less prominent, arthropathic changes of the left hip. Mild degenerative changes are noted along the visualized spine.  IMPRESSION: 1. As suggested on standard radiographs, there are loops small bowel in the left mid abdomen which showed wall thickening. This is nonspecific consistent with an infectious or inflammatory enteritis. No abscess or extraluminal air. 2. Appendix measures mildly dilated common just under 8 mm, but the appendix is otherwise unremarkable with no wall thickening and with luminal air. Unless there are clinical findings suspicious for appendicitis, this should be considered a normal variant for this patient. 3. Lung base atelectasis and/ scarring. 4. Multiple renal low-density lesions, most likely all cysts. Small nonobstructing stones in each kidney. Small presumed liver cyst. These findings are stable. 5. Other chronic findings as detailed.   Electronically Signed   By: Amie Portland M.D.   On: 06/18/2014 15:04   Dg Abd Acute W/chest  06/18/2014   CLINICAL DATA:  Abdominal pain.  Emesis.  EXAM: ACUTE ABDOMEN SERIES (ABDOMEN 2 VIEW & CHEST 1 VIEW)  COMPARISON:  CT, 12/30/2011.  FINDINGS: There is no significant bowel dilation to suggest obstruction. There are prominent loops of small bowel in the left central abdomen to left upper quadrant with some questionable fold thickening. No free air is seen. There are few nonspecific air-fluid levels in the left abdomen on the decubitus view.  A vena cava filter is stable from the prior CT. Soft tissues are otherwise unremarkable.  Frontal chest radiograph shows no lung consolidation or edema. Cardiac silhouette is normal in size. No mediastinal or hilar masses.  There are degenerative changes noted along the spine with more significant arthropathic changes involving the right hip, with milder changes involving the left hip.  IMPRESSION: 1. No obstruction  or free air. 2. Prominent small bowel in the left central to left upper quadrant region of the abdomen with a few nonspecific air-fluid levels. There is questionable fold thickening of these loops. This may reflect an inflammatory or infectious enteritis. Followup abdomen and pelvis CT with contrast should be considered.   Electronically Signed   By: Amie Portland M.D.   On: 06/18/2014 12:34     EKG Interpretation None      MDM   Final diagnoses:  Nausea and vomiting, vomiting of unspecified type  Abdominal pain, unspecified abdominal location   Plan admission   Devoria Albe, MD, FACEP      Mishawn Didion L  Lynelle Doctor, MD 06/18/14 [phone]

## 2014-06-18 NOTE — H&P (Signed)
[facility] History and Physical  [name] UJW:[id number] DOB: [birth year] DOA: 06/18/2014  Referring physician: ER physician PCP: Colon Branch, MD   Chief Complaint: vomiting   HPI:  63 year old male with past medical history of dementia, hypertension, dyslipidemia who presented to [facility] ED [facility]/18/2014 from SNF with bilious emesis for past 24 hours prior to this admission as reported by SNF staff. In addition, SNF staff reported his mental status was little different, he was not acting himself. No reports of fevers, respiratory distress. Pt is not a good historian due to dementia.  In ED,  Pt was hemodynamically stable. BP was 123/80, HR 92, RR 16, T max 98.3 F. Blood work showed mild anemia of 12.3, platelets of 148 otherwise unremarkable. Ct abdomen showed possible enteritis and possible appendicitis. Surgery has reviewed the CT abdomen and did not feel pt has surgical appendicitis. Abdominal X ray showed no obstruction.   Assessment & Plan    Active Problems:   Vomiting  Unclear etiology at this time. CT abdomen with possible enteritis. No fever and WBC count WNL so would defer treatment with abx for now.  Needs SLP evaluation. Order placed for NPO except for meds but needs bedside swallow eval and if does not pass then will hold off on PO meds.  Supportive care with IV fluids, antiemetics.   Aspiration precaution  Dementia / Acute encephalopathy  Restarted namenda and aricept but may need to be held if unable to swallow  May continue Risperdal and abilify as well if passes SLP Hypertension  Restarted lisinopril and lasix. Normal renal function.  Dyslipidemia  Continue omega 3 supplementation  DVT prophylaxis:   SCD's bilaterally while pt is in hospital   Radiological Exams on Admission:  Ct Abdomen Pelvis W Contrast 06/18/2014  1. As suggested on standard radiographs, there are loops small bowel in the left mid abdomen which showed wall thickening. This is  nonspecific consistent with an infectious or inflammatory enteritis. No abscess or extraluminal air. 2. Appendix measures mildly dilated common just under 8 mm, but the appendix is otherwise unremarkable with no wall thickening and with luminal air. Unless there are clinical findings suspicious for appendicitis, this should be considered a normal variant for this patient. 3. Lung base atelectasis and/ scarring. 4. Multiple renal low-density lesions, most likely all cysts. Small nonobstructing stones in each kidney. Small presumed liver cyst. These findings are stable. 5. Other chronic findings as detailed.   Dg Abd Acute W/chest 06/18/2014   1. No obstruction or free air. 2. Prominent small bowel in the left central to left upper quadrant region of the abdomen with a few nonspecific air-fluid levels. There is questionable fold thickening of these loops. This may reflect an inflammatory or infectious enteritis. Followup abdomen and pelvis CT with contrast should be considered.      Code Status: Full Family Communication: Family not at the bedside  Disposition Plan: Admit for further evaluation  [name] Passey, MD  [facility] Pager [phone]  Review of Systems:  Unable to obtain due to mental status, dementia   Past Medical History  Diagnosis Date  . Mental retardation   . DVT (deep venous thrombosis)     right leg  . Dyslipidemia   . Obesity   . Hypertension    History reviewed. No pertinent past surgical history. Social History:  reports that he has never smoked. He has never used smokeless tobacco. His alcohol and drug histories are not on file.  No Known  Allergies  Family History:  Family History  Problem Relation Age of Onset  . Hypertension       Prior to Admission medications   Medication Sig Start Date End Date Taking? Authorizing Provider  ARIPiprazole (ABILIFY) 30 MG tablet Take 30 mg by mouth daily.   Yes Historical Provider, MD  Cholecalciferol (VITAMIN D3) 1000  UNITS CAPS Take 1 capsule by mouth 2 (two) times daily.     Yes Historical Provider, MD  donepezil (ARICEPT) 10 MG tablet Take 10 mg by mouth at bedtime.    Yes Historical Provider, MD  ferrous sulfate 325 (65 FE) MG tablet Take 325 mg by mouth daily with breakfast.     Yes Historical Provider, MD  furosemide (LASIX) 40 MG tablet Take 40 mg by mouth daily.   Yes Historical Provider, MD  gabapentin (NEURONTIN) 100 MG capsule Take 100 mg by mouth at bedtime.     Yes Historical Provider, MD  lisinopril (PRINIVIL,ZESTRIL) 10 MG tablet Take 10 mg by mouth daily.     Yes Historical Provider, MD  LORazepam (ATIVAN) 0.5 MG tablet Take 0.5 mg by mouth every 4 (four) hours as needed (for agitation).   Yes Historical Provider, MD  memantine (NAMENDA) 10 MG tablet Take 10 mg by mouth 2 (two) times daily.     Yes Historical Provider, MD  Omega-3 Fatty Acids (FISH OIL) 1200 MG CAPS Take 1 capsule by mouth daily.     Yes Historical Provider, MD  potassium chloride (KLOR-CON) 10 MEQ CR tablet Take 10 mEq by mouth daily.     Yes Historical Provider, MD  risperiDONE (RISPERDAL) 1 MG tablet Take 1 mg by mouth at bedtime.   Yes Historical Provider, MD  risperiDONE (RISPERDAL) 2 MG tablet Take 2 mg by mouth daily with breakfast.   Yes Historical Provider, MD  Skin Protectants, Misc. (EUCERIN) cream Apply 1 application topically daily.   Yes Historical Provider, MD   Physical Exam: Filed Vitals:   06/18/14 1049 06/18/14 1050 06/18/14 1330 06/18/14 1627  BP: 123/80  145/95 127/80  Pulse: 92  96 93  Temp: 98.3 F (36.8 C)     TempSrc: Oral     Resp: SpO2: 99% 100% 97% 99%    Physical Exam  Constitutional: Appears in no distress, alert but not able to provide any history  HENT: Normocephalic. No tonsillar erythema or exudates Eyes: Conjunctivae are normal. PERRLA, no scleral icterus.  Neck: Normal ROM. Neck supple. No JVD. No tracheal deviation CVS: RRR, S1/S2 appreciated  Pulmonary: diminished  breath sounds bilaterally, no wheezing  Abdominal: Soft. BS +,  no distension, tenderness, rebound or guarding.  Musculoskeletal: Normal range of motion. No tenderness.  Lymphadenopathy: No lymphadenopathy noted, cervical, inguinal. Neuro: Alert. No focal neurologic deficits  Skin: Skin is warm and dry. No rash noted.   Psychiatric: unable to assess due to poor mental status   Labs on Admission:  Basic Metabolic Panel:  Recent Labs Lab 06/18/14 1153  NA 138  K 4.7  CL 100  CO2 23  GLUCOSE 81  BUN 24*  CREATININE 1.22  CALCIUM 9.5   Liver Function Tests:  Recent Labs Lab 06/18/14 1153  AST 16  ALT 8  ALKPHOS 69  BILITOT 0.3  PROT 7.2  ALBUMIN 3.7   No results found for this basename: LIPASE, AMYLASE,  in the last 168 hours No results found for this basename: AMMONIA,  in the last 168 hours CBC:  Recent  Labs Lab 06/18/14 1153  WBC 8.3  NEUTROABS 5.2  HGB 12.3*  HCT 37.8*  MCV 91.1  PLT 148*   Cardiac Enzymes: No results found for this basename: CKTOTAL, CKMB, CKMBINDEX, TROPONINI,  in the last 168 hours BNP: No components found with this basename: POCBNP,  CBG: No results found for this basename: GLUCAP,  in the last 168 hours  If 7PM-7AM, please contact night-coverage [email or web address] Password [email or web address] 06/18/2014, 5:11 PM

## 2014-06-18 NOTE — ED Notes (Signed)
In and Out done by April and I

## 2014-06-18 NOTE — ED Notes (Signed)
Per ems pt is from [facility], pt had 1 episode of vomiting this morning, yellow bile, staff at facility reports "pt was not acting his baseline". Pt has MR and dementia, and responding appropriately, interacts and follows directions. Denies pain.

## 2014-06-18 NOTE — ED Notes (Signed)
Patient transported to X-ray 

## 2014-06-19 ENCOUNTER — Encounter (HOSPITAL_COMMUNITY): Payer: Self-pay | Admitting: Internal Medicine

## 2014-06-19 DIAGNOSIS — E785 Hyperlipidemia, unspecified: Secondary | ICD-10-CM

## 2014-06-19 DIAGNOSIS — G9340 Encephalopathy, unspecified: Secondary | ICD-10-CM | POA: Diagnosis present

## 2014-06-19 DIAGNOSIS — D649 Anemia, unspecified: Secondary | ICD-10-CM

## 2014-06-19 DIAGNOSIS — I10 Essential (primary) hypertension: Secondary | ICD-10-CM

## 2014-06-19 DIAGNOSIS — F0390 Unspecified dementia without behavioral disturbance: Secondary | ICD-10-CM

## 2014-06-19 DIAGNOSIS — R112 Nausea with vomiting, unspecified: Secondary | ICD-10-CM

## 2014-06-19 HISTORY — DX: Unspecified dementia, unspecified severity, without behavioral disturbance, psychotic disturbance, mood disturbance, and anxiety: F03.90

## 2014-06-19 HISTORY — DX: Hyperlipidemia, unspecified: E78.5

## 2014-06-19 LAB — COMPREHENSIVE METABOLIC PANEL
ALK PHOS: 60 U/L (ref 39–117)
ALT: 7 U/L (ref 0–53)
AST: 12 U/L (ref 0–37)
Albumin: 3.2 g/dL — ABNORMAL LOW (ref 3.5–5.2)
Anion gap: 9 (ref 5–15)
BUN: 18 mg/dL (ref 6–23)
CO2: 27 mEq/L (ref 19–32)
Calcium: 9 mg/dL (ref 8.4–10.5)
Chloride: 102 mEq/L (ref 96–112)
Creatinine, Ser: 1.28 mg/dL (ref 0.50–1.35)
GFR calc non Af Amer: 59 mL/min — ABNORMAL LOW (ref >90)
GFR, EST AFRICAN AMERICAN: 68 mL/min — AB (ref >90)
GLUCOSE: 80 mg/dL (ref 70–99)
POTASSIUM: 4.6 meq/L (ref 3.7–5.3)
Sodium: 138 mEq/L (ref 137–147)
TOTAL PROTEIN: 6.2 g/dL (ref 6.0–8.3)
Total Bilirubin: 0.4 mg/dL (ref 0.3–1.2)

## 2014-06-19 LAB — GLUCOSE, CAPILLARY
Glucose-Capillary: 68 mg/dL — ABNORMAL LOW (ref 70–99)
Glucose-Capillary: 92 mg/dL (ref 70–99)

## 2014-06-19 LAB — CBC
HCT: 30.6 % — ABNORMAL LOW (ref 39.0–52.0)
Hemoglobin: 9.9 g/dL — ABNORMAL LOW (ref 13.0–17.0)
MCH: 29.5 pg (ref 26.0–34.0)
MCHC: 32.4 g/dL (ref 30.0–36.0)
MCV: 91.1 fL (ref 78.0–100.0)
Platelets: 207 10*3/uL (ref 150–400)
RBC: 3.36 MIL/uL — ABNORMAL LOW (ref 4.22–5.81)
RDW: 15.2 % (ref 11.5–15.5)
WBC: 5.7 10*3/uL (ref 4.0–10.5)

## 2014-06-19 LAB — FOLATE

## 2014-06-19 LAB — VITAMIN B12: Vitamin B-12: 376 pg/mL (ref 211–911)

## 2014-06-19 LAB — IRON AND TIBC
Iron: 50 ug/dL (ref 42–135)
Saturation Ratios: 24 % (ref 20–55)
TIBC: 206 ug/dL — ABNORMAL LOW (ref 215–435)
UIBC: 156 ug/dL (ref 125–400)

## 2014-06-19 LAB — FERRITIN: Ferritin: 341 ng/mL — ABNORMAL HIGH (ref 22–322)

## 2014-06-19 MED ORDER — DEXTROSE 50 % IV SOLN
25.0000 mL | Freq: Once | INTRAVENOUS | Status: AC | PRN
  Administered 2014-06-19: 25 mL via INTRAVENOUS
  Filled 2014-06-19: qty 50

## 2014-06-19 NOTE — Progress Notes (Signed)
[facility] PROGRESS NOTE  [name] ZOX:[id number] DOB: [birth year] DOA: 06/18/2014 PCP: Colon Branch, MD  Assessment/Plan: #1 emesis/probable gastroenteritis CT of the abdomen and pelvis with no significant abnormalities however consistent with a possible enteritis. The admitting physician Gen. surgery was consulted to look at films and felt patient did not have appendicitis. Patient with clinical improvement. No further emesis. WIll place on a diet. Supportive care. IV fluids. Follow.  #2 anemia Likely dilutional component. Patient with no overt bleeding. Anemia panel consistent with anemia of chronic disease. Follow H&H. Transfusion threshold hemoglobin less than 7. Will likely need outpatient followup.  #3 dementia/acute encephalopathy Namenda and Aricept have been resumed. Continue respiratory and Abilify.  #4 hypertension Stable. Continue home regimen of lisinopril and Lasix.  #5 hyperlipidemia Continue omega-3.  #6 prophylaxis SCDs for DVT prophylaxis.  Code Status: Full Family Communication: Updated patient no family at bedside. Disposition Plan: Back to her nursing facility when medically stable.   Consultants:  None  Procedures:  CT abdomen and pelvis 06/18/2014  Acute abdominal series 06/18/2014  Antibiotics:  None  HPI/Subjective: Patient denies any further emesis. No complaints.  Objective: Filed Vitals:   06/19/14 0616  BP: 106/76  Pulse: 81  Temp: 97.8 F (36.6 C)  Resp: 16    Intake/Output Summary (Last 24 hours) at 06/19/14 1146 Last data filed at 06/19/14 0703  Gross per 24 hour  Intake      0 ml  Output    725 ml  Net   -725 ml   Filed Weights   06/19/14 0700  Weight: 90 kg (198 lb 6.6 oz)    Exam:   General:  NAD  Cardiovascular: RRR  Respiratory: CTAB in the anterior lung fields  Abdomen: Soft, nontender, nondistended, positive bowel sounds.  Musculoskeletal: No clubbing cyanosis or edema  Data  Reviewed: Basic Metabolic Panel:  Recent Labs Lab 06/18/14 1153 06/19/14 0513  NA 138 138  K 4.7 4.6  CL 100 102  CO2 23 27  GLUCOSE 81 80  BUN 24* 18  CREATININE 1.22 1.28  CALCIUM 9.5 9.0   Liver Function Tests:  Recent Labs Lab 06/18/14 1153 06/19/14 0513  AST 16 12  ALT 8 7  ALKPHOS 69 60  BILITOT 0.3 0.4  PROT 7.2 6.2  ALBUMIN 3.7 3.2*   No results found for this basename: LIPASE, AMYLASE,  in the last 168 hours No results found for this basename: AMMONIA,  in the last 168 hours CBC:  Recent Labs Lab 06/18/14 1153 06/19/14 0513  WBC 8.3 5.7  NEUTROABS 5.2  --   HGB 12.3* 9.9*  HCT 37.8* 30.6*  MCV 91.1 91.1  PLT 148* 207   Cardiac Enzymes: No results found for this basename: CKTOTAL, CKMB, CKMBINDEX, TROPONINI,  in the last 168 hours BNP (last 3 results) No results found for this basename: PROBNP,  in the last 8760 hours CBG:  Recent Labs Lab 06/19/14 0727 06/19/14 0821  GLUCAP 68* 92    No results found for this or any previous visit (from the past 240 hour(s)).   Studies: Ct Abdomen Pelvis W Contrast  06/18/2014   CLINICAL DATA:  Vomiting episode this morning. Depressed mental status.  EXAM: CT ABDOMEN AND PELVIS WITH CONTRAST  TECHNIQUE: Multidetector CT imaging of the abdomen and pelvis was performed using the standard protocol following bolus administration of intravenous contrast.  CONTRAST:  50mL OMNIPAQUE IOHEXOL 300 MG/ML SOLN, OMNIPAQUE IOHEXOL 300 MG/ML SOLN  COMPARISON:  12/30/2011  FINDINGS:  In the left mid abdomen there are loops of small bowel which show mild wall thickening. Minor stranding is seen in the adjacent mesenteric. There is no wall air or extraluminal air. There is no evidence of obstruction. The remainder of the small bowel is unremarkable. Colon is unremarkable. The appendix is visualized. It measures mildly thickened, just under 8 mm, but the wall appears normal thickness and areas appendiceal air. This is most  likely a normal variant for this patient.  Lung bases show a minimal right effusion and dependent coarse reticular type opacity most consistent with atelectasis, scarring or a combination. Calcified plaque is noted along the right diaphragmatic surface. Heart is normal in size.  Liver shows a stable low-density lesion and dome consistent with a cyst. Liver otherwise unremarkable.  Spleen, pancreas and gallbladder are unremarkable. No bile duct dilation no adrenal masses.  Multiple low-density renal lesions are noted consistent with cysts. There are single nonobstructing stones in the upper poles of each kidney. There is no hydronephrosis. Normal ureters. Bladder is unremarkable.  No pathologically enlarged lymph nodes. There is no ascites. Vena cava filter has its superior margin just below the right renal vein.  Prominent varicosities are noted in the subcutaneous soft tissues of the anterior, inferior abdominal wall.  There are advanced arthropathic changes of the right hip with significant, but less prominent, arthropathic changes of the left hip. Mild degenerative changes are noted along the visualized spine.  IMPRESSION: 1. As suggested on standard radiographs, there are loops small bowel in the left mid abdomen which showed wall thickening. This is nonspecific consistent with an infectious or inflammatory enteritis. No abscess or extraluminal air. 2. Appendix measures mildly dilated common just under 8 mm, but the appendix is otherwise unremarkable with no wall thickening and with luminal air. Unless there are clinical findings suspicious for appendicitis, this should be considered a normal variant for this patient. 3. Lung base atelectasis and/ scarring. 4. Multiple renal low-density lesions, most likely all cysts. Small nonobstructing stones in each kidney. Small presumed liver cyst. These findings are stable. 5. Other chronic findings as detailed.   Electronically Signed   By: Amie Portland M.D.   On:  06/18/2014 15:04   Dg Abd Acute W/chest  06/18/2014   CLINICAL DATA:  Abdominal pain.  Emesis.  EXAM: ACUTE ABDOMEN SERIES (ABDOMEN 2 VIEW & CHEST 1 VIEW)  COMPARISON:  CT, 12/30/2011.  FINDINGS: There is no significant bowel dilation to suggest obstruction. There are prominent loops of small bowel in the left central abdomen to left upper quadrant with some questionable fold thickening. No free air is seen. There are few nonspecific air-fluid levels in the left abdomen on the decubitus view.  A vena cava filter is stable from the prior CT. Soft tissues are otherwise unremarkable.  Frontal chest radiograph shows no lung consolidation or edema. Cardiac silhouette is normal in size. No mediastinal or hilar masses.  There are degenerative changes noted along the spine with more significant arthropathic changes involving the right hip, with milder changes involving the left hip.  IMPRESSION: 1. No obstruction or free air. 2. Prominent small bowel in the left central to left upper quadrant region of the abdomen with a few nonspecific air-fluid levels. There is questionable fold thickening of these loops. This may reflect an inflammatory or infectious enteritis. Followup abdomen and pelvis CT with contrast should be considered.   Electronically Signed   By: Amie Portland M.D.   On: 06/18/2014 12:34  Scheduled Meds: . ARIPiprazole  30 mg Oral Daily  . cholecalciferol  1,000 Units Oral BID  . donepezil  10 mg Oral QHS  . ferrous sulfate  325 mg Oral QPC breakfast  . furosemide  40 mg Oral Daily  . gabapentin  100 mg Oral QHS  . hydrocerin  1 application Topical Daily  . lisinopril  10 mg Oral Daily  . memantine  10 mg Oral BID  . omega-3 acid ethyl esters  1 g Oral Daily  . risperiDONE  1 mg Oral QHS  . risperiDONE  2 mg Oral Daily   Continuous Infusions: . sodium chloride 100 mL/hr at 06/19/14 9147    Principal Problem:   Vomiting Active Problems:   Hypertension   Anemia   Dementia   Acute  encephalopathy   Other and unspecified hyperlipidemia    Time spent: 35 mins    [facility] MD [facility] Pager [phone]. If 7PM-7AM, please contact night-coverage at [email or web address], password [facility] 06/19/2014, 11:46 AM  LOS: 1 day

## 2014-06-19 NOTE — Progress Notes (Signed)
Clinical Social Work  Per chart review, PT recommending SNF placement. CSW has attempted to reach guardian several times today but still unable to talk with guardian. CSW will continue to follow.  [place], [place] [phone]

## 2014-06-19 NOTE — Progress Notes (Signed)
Pt CBG 68 this am. 25ml D50 given. CBG rechecked 92. Pt is currently resting comfortably in bed without signs/symptoms of distress.

## 2014-06-19 NOTE — Progress Notes (Signed)
OT Cancellation Note  Patient Details Name: Kenechukwu Eckstein MRN: [id number] DOB: Jan 20, 1951   Cancelled Treatment:    Reason Eval/Treat Not Completed: Other (comment) Noted pt from ALF and returning to ALF. Will defer OT to eval to ALF when pt is in a familiar environment. Thanks,  Alba Cory 06/19/2014, 11:51 AM

## 2014-06-19 NOTE — Evaluation (Signed)
Clinical/Bedside Swallow Evaluation Patient Details  Name: [name] MRN: [id number] Date of Birth: 19-Oct-1950  Today's Date: 06/19/2014 Time: 1005-1038 SLP Time Calculation (min): 33 min  Past Medical History:  Past Medical History  Diagnosis Date  . Mental retardation   . DVT (deep venous thrombosis)     right leg  . Dyslipidemia   . Obesity   . Hypertension    Past Surgical History: History reviewed. No pertinent past surgical history. HPI:  63 year old male admitted 06/18/14 due to vomiting. PMH significant for dementia, mental retardation.   Assessment / Plan / Recommendation Clinical Impression  Oral motor strength and function appear adequate. Pt exhibits no overt s/s aspiration with any consistency tested. Voice quality is clear, and swallow appears timely. Likely baseline function, pt noted to grind his teeth when chewing, and tends to be impulsive with bite size. For this reason, will begin soft diet with chopped meats and supervision to monitor safety and cue for small bites/sips at slow rate. No further ST intervention recommended at this time, however, available if needs arise.     Aspiration Risk  Mild    Diet Recommendation Dysphagia 3 (Mechanical Soft);Thin liquid (chop meats)   Liquid Administration via: Straw Medication Administration: Whole meds with liquid Supervision: Patient able to self feed;Intermittent supervision to cue for compensatory strategies Compensations: Slow rate;Small sips/bites Postural Changes and/or Swallow Maneuvers: Seated upright 90 degrees    Other  Recommendations Oral Care Recommendations: Oral care BID Other Recommendations: Clarify dietary restrictions   Follow Up Recommendations  24 hour supervision/assistance    Frequency and Duration        Pertinent Vitals/Pain VSS, no pain reported    SLP Swallow Goals  n/a   Swallow Study Prior Functional Status   No history of swallowing difficulty. Pt tolerating regular  diet/thin liquids prior to admit.    General Date of Onset: 06/18/14 HPI: 63 year old male admitted 06/18/14 due to vomiting. PMH significant for dementia, mental retardation. Type of Study: Bedside swallow evaluation Previous Swallow Assessment: none found Diet Prior to this Study: NPO Temperature Spikes Noted: No History of Recent Intubation: No Behavior/Cognition: Decreased sustained attention;Alert;Cooperative;Pleasant mood;Distractible Oral Cavity - Dentition: Adequate natural dentition Self-Feeding Abilities: Able to feed self Patient Positioning: Upright in bed Baseline Vocal Quality: Clear Volitional Cough: Cognitively unable to elicit Volitional Swallow: Unable to elicit    Oral/Motor/Sensory Function Overall Oral Motor/Sensory Function: Appears within functional limits for tasks assessed   Ice Chips Ice chips: Not tested   Thin Liquid Thin Liquid: Within functional limits Presentation: Straw    Nectar Thick Nectar Thick Liquid: Not tested   Honey Thick Honey Thick Liquid: Not tested   Puree Puree: Within functional limits Presentation: Self Fed;Spoon   Solid   GO   Pranav Lince B. Murvin [facility], CCC-SLP [phone] Solid: Within functional limits Presentation: Self Fed       Murvin Natal, Ruffin Pyo 06/19/2014,10:38 AM

## 2014-06-19 NOTE — Progress Notes (Signed)
Clinical Social Work Department BRIEF PSYCHOSOCIAL ASSESSMENT 06/19/2014  Patient:  [name], [name]     Account Number:  [id number]     Admit date:  06/18/2014  Clinical Social Worker:  Dennison Bulla  Date/Time:  06/19/2014 10:40 AM  Referred by:  Physician  Date Referred:  06/19/2014 Referred for  ALF Placement   Other Referral:   Interview type:  Other - See comment Other interview type:   ALF    PSYCHOSOCIAL DATA Living Status:  FACILITY Admitted from facility:  [facility] Level of care:  Assisted Living Primary support name:  [name] Primary support relationship to patient:  NONE Degree of support available:   ALF reports that [name] is patient's guardian.    CURRENT CONCERNS Current Concerns  Post-Acute Placement   Other Concerns:    SOCIAL WORK ASSESSMENT / PLAN CSW received referral due to patient being admitted from ALF. CSW reviewed chart and attempted to meet with patient at bedside. Patient unable to participate in assessment due to confusion.    CSW called and spoke with [facility] who confirmed that patient is a resident. ALF is agreeable for patient to return at DC once stable. ALF reports that patient has a guardian and reports [name]'s # is [phone]. CSW attempted to call guardian several times but phone continues to ring and unable to leave a voicemail. CSW will continue to try and contact guardian.    Per chart review, MD has ordered PT/OT evaluations so CSW will continue to follow to assist with DC planning.   Assessment/plan status:  Psychosocial Support/Ongoing Assessment of Needs Other assessment/ plan:   Information/referral to community resources:   Will return to ALF?    PATIENT'S/FAMILY'S RESPONSE TO PLAN OF CARE: Patient unable to participate in assessment. ALF agreeable to help as needed and provided CSW with guardian contact information but CSW unable to reach guardian at this time. ALF agreeable to accept patient at DC.        [place], [place] [phone]

## 2014-06-19 NOTE — Progress Notes (Signed)
Utilization review completed.  

## 2014-06-19 NOTE — Progress Notes (Signed)
INITIAL NUTRITION ASSESSMENT  DOCUMENTATION CODES Per approved criteria  -Not Applicable   INTERVENTION: - Diet advancement per MD (was on regular/thin liquid diet PTA) - RD to continue to monitor   NUTRITION DIAGNOSIS: Inadequate oral intake related to inability to eat as evidenced by NPO.   Goal: Advance diet as tolerated to regular diet  Monitor:  Weights, labs, diet advancement  Reason for Assessment: Malnutrition screening tool, consult for assessment   63 y.o. male  Admitting Dx: Vomiting  ASSESSMENT: Pt with past medical history of dementia, hypertension, dyslipidemia who presented to [facility] ED 06/18/2014 from SNF with bilious emesis for past 24 hours prior to this admission as reported by SNF staff. In addition, SNF staff reported his mental status was little different, he was not acting himself. No reports of fevers, respiratory distress. Pt is not a good historian due to dementia.  -Pt discussed during multidisciplinary rounds.  -Pt alone in room, yelling out for his girlfriend, which nurse tech reports he has been doing most of the morning -Per conversation with [employer], pt was on a regular diet with thin liquids and typically ate >90% of his meals  -Not on any nutritional supplements PTA -[facility] staff report pt didn't eat much for 1 day PTA due to nausea/vomiting   Height: Ht Readings from Last 1 Encounters:  06/18/14  (1.803 m)    Weight: Wt Readings from Last 1 Encounters:  06/19/14 198 lb 6.6 oz (90 kg)    Ideal Body Weight: 172 lbs   % Ideal Body Weight: 115%  Wt Readings from Last 10 Encounters:  06/19/14 198 lb 6.6 oz (90 kg)  08/03/11 200 lb (90.719 kg)  07/09/11 201 lb (91.173 kg)    Usual Body Weight: 198 lbs per staff  % Usual Body Weight: 100%  BMI:  Body mass index is 27.69 kg/(m^2).  Estimated Nutritional Needs: Kcal: 1900-2100 Protein: 90-110g Fluid: 1.9-2.1L/day   Skin: intact   Diet Order: NPO  EDUCATION  NEEDS: -No education needs identified at this time   Intake/Output Summary (Last 24 hours) at 06/19/14 0925 Last data filed at 06/19/14 0703  Gross per 24 hour  Intake      0 ml  Output    725 ml  Net   -725 ml    Last BM: PTA  Labs:   Recent Labs Lab 06/18/14 1153 06/19/14 0513  NA 138 138  K 4.7 4.6  CL 100 102  CO2 23 27  BUN 24* 18  CREATININE 1.22 1.28  CALCIUM 9.5 9.0  GLUCOSE 81 80    CBG (last 3)   Recent Labs  06/19/14 0727 06/19/14 0821  GLUCAP 68* 92    Scheduled Meds: . ARIPiprazole  30 mg Oral Daily  . cholecalciferol  1,000 Units Oral BID  . donepezil  10 mg Oral QHS  . ferrous sulfate  325 mg Oral QPC breakfast  . furosemide  40 mg Oral Daily  . gabapentin  100 mg Oral QHS  . hydrocerin  1 application Topical Daily  . lisinopril  10 mg Oral Daily  . memantine  10 mg Oral BID  . omega-3 acid ethyl esters  1 g Oral Daily  . risperiDONE  1 mg Oral QHS  . risperiDONE  2 mg Oral Daily    Continuous Infusions: . sodium chloride 100 mL/hr at 06/19/14 1610    Past Medical History  Diagnosis Date  . Mental retardation   . DVT (deep venous  thrombosis)     right leg  . Dyslipidemia   . Obesity   . Hypertension     History reviewed. No pertinent past surgical history.  Charlott Rakes MS, RD, LDN [phone] Pager [phone] Weekend/After Hours Pager

## 2014-06-19 NOTE — Evaluation (Signed)
Physical Therapy Evaluation Patient Details Name: [name] MRN: [id number] DOB: 08-31-1951 Today's Date: 06/19/2014   History of Present Illness  63 y.o. male with h/o mental retardation, dementia, HTN admitted from ALF with vomiting.   Clinical Impression  *Pt admitted with vomiting, AMS**. Pt currently with functional limitations due to the deficits listed below (see PT Problem List).  Pt will benefit from skilled PT to increase their independence and safety with mobility to allow discharge to the venue listed below.   Total assist for supine to sit, pt required min A for sitting balance. Pt refused getting OOB. Pt's bed saturated in urine, nurse tech notified of need for linen change. Per [facility], pt was ambulatory prior to admission. He received assistance for bathing/dressing.  PT will follow to maximize safety and independence with mobility.   **    Follow Up Recommendations SNF;Supervision/Assistance - 24 hour    Equipment Recommendations  Rolling walker with 5" wheels;Wheelchair (measurements PT)    Recommendations for Other Services       Precautions / Restrictions Precautions Precautions: Fall Restrictions Weight Bearing Restrictions: No      Mobility  Bed Mobility Overal bed mobility: Needs Assistance Bed Mobility: Supine to Sit     Supine to sit: Total assist     General bed mobility comments: assist to advance BLEs and to raise trunk  Transfers                 General transfer comment: Not assessed, pt requested to lie back down  Ambulation/Gait                Stairs            Wheelchair Mobility    Modified Rankin (Stroke Patients Only)       Balance Overall balance assessment: Needs assistance Sitting-balance support: Bilateral upper extremity supported;Feet supported Sitting balance-Leahy Scale: Poor Sitting balance - Comments: pt sat on EOB x 5 minutes and required intermittent min A for balance, he tended to lean R  and posteriorly Postural control: Right lateral lean;Posterior lean                                   Pertinent Vitals/Pain Pain Assessment: No/denies pain    Home Living Family/patient expects to be discharged to:: Assisted living [facility])                      Prior Function Level of Independence: Needs assistance   Gait / Transfers Assistance Needed: walked without assistive device per ALF  ADL's / Homemaking Assistance Needed: assist at ALF        Hand Dominance        Extremity/Trunk Assessment               Lower Extremity Assessment: LLE deficits/detail;RLE deficits/detail (difficult to assess strength as pt didn't follow directions) RLE Deficits / Details: PROM knee extension -15*, ankle DF 0*, pt keeps BLEs ADDUcted LLE Deficits / Details: PROM knee extension -15*, ankle DF 0*, pt keeps BLEs ADDUcted  Cervical / Trunk Assessment: Kyphotic  Communication   Communication: Expressive difficulties (slurred speech, difficult to understand)  Cognition Arousal/Alertness: Lethargic Behavior During Therapy: Flat affect Overall Cognitive Status: No family/caregiver present to determine baseline cognitive functioning (h/o mental retardation, unclear if he's at baseline)  General Comments      Exercises        Assessment/Plan    PT Assessment Patient needs continued PT services  PT Diagnosis Generalized weakness   PT Problem List Decreased range of motion;Decreased activity tolerance;Decreased balance;Decreased cognition;Decreased mobility;Decreased strength  PT Treatment Interventions Functional mobility training;Therapeutic activities;Patient/family education;Therapeutic exercise;Balance training   PT Goals (Current goals can be found in the Care Plan section) Acute Rehab PT Goals PT Goal Formulation: Patient unable to participate in goal setting Time For Goal Achievement: 07/03/14 Potential to Achieve  Goals: Fair    Frequency Min 3X/week   Barriers to discharge        Co-evaluation               End of Session   Activity Tolerance: Patient limited by lethargy Patient left: in bed;with call bell/phone within reach;with bed alarm set Nurse Communication: Mobility status (bed saturated in urine, condom catheter detached)         Time: 1610-9604 PT Time Calculation (min): 13 min   Charges:   PT Evaluation $Initial PT Evaluation Tier I: 1 Procedure PT Treatments $Therapeutic Activity: 8-22 mins   PT G Codes:          Tamala Ser 06/19/2014, 2:25 PM [phone]

## 2014-06-20 DIAGNOSIS — K529 Noninfective gastroenteritis and colitis, unspecified: Secondary | ICD-10-CM | POA: Diagnosis present

## 2014-06-20 DIAGNOSIS — K5289 Other specified noninfective gastroenteritis and colitis: Principal | ICD-10-CM

## 2014-06-20 DIAGNOSIS — F0390 Unspecified dementia without behavioral disturbance: Secondary | ICD-10-CM

## 2014-06-20 LAB — CBC
HEMATOCRIT: 29.8 % — AB (ref 39.0–52.0)
HEMOGLOBIN: 9.7 g/dL — AB (ref 13.0–17.0)
MCH: 29.6 pg (ref 26.0–34.0)
MCHC: 32.6 g/dL (ref 30.0–36.0)
MCV: 90.9 fL (ref 78.0–100.0)
Platelets: 222 10*3/uL (ref 150–400)
RBC: 3.28 MIL/uL — ABNORMAL LOW (ref 4.22–5.81)
RDW: 15.1 % (ref 11.5–15.5)
WBC: 6.2 10*3/uL (ref 4.0–10.5)

## 2014-06-20 LAB — BASIC METABOLIC PANEL
Anion gap: 8 (ref 5–15)
BUN: 15 mg/dL (ref 6–23)
CHLORIDE: 98 meq/L (ref 96–112)
CO2: 27 meq/L (ref 19–32)
Calcium: 9.2 mg/dL (ref 8.4–10.5)
Creatinine, Ser: 1.23 mg/dL (ref 0.50–1.35)
GFR calc Af Amer: 72 mL/min — ABNORMAL LOW (ref >90)
GFR calc non Af Amer: 62 mL/min — ABNORMAL LOW (ref >90)
Glucose, Bld: 83 mg/dL (ref 70–99)
Potassium: 4.1 mEq/L (ref 3.7–5.3)
Sodium: 133 mEq/L — ABNORMAL LOW (ref 137–147)

## 2014-06-20 LAB — GLUCOSE, CAPILLARY
GLUCOSE-CAPILLARY: 66 mg/dL — AB (ref 70–99)
Glucose-Capillary: 78 mg/dL (ref 70–99)
Glucose-Capillary: 85 mg/dL (ref 70–99)

## 2014-06-20 NOTE — Progress Notes (Signed)
[facility] PROGRESS NOTE  [name] ZOX:[id number] DOB: 1951/06/28 DOA: 06/18/2014 PCP: Colon Branch, MD  Assessment/Plan: #1 emesis/probable gastroenteritis CT of the abdomen and pelvis with no significant abnormalities however consistent with a possible enteritis. The admitting physician Gen. surgery was consulted to look at films and felt patient did not have appendicitis. Patient with clinical improvement. No further emesis. Tolerating current diet. NSL IVF. Follow.  #2 anemia Likely dilutional component. Patient with no overt bleeding. Anemia panel consistent with anemia of chronic disease. Follow H&H. Transfusion threshold hemoglobin less than 7. H/H stable.  #3 dementia/acute encephalopathy Namenda and Aricept have been resumed. Continue respiratory and Abilify.  #4 hypertension Stable. Continue home regimen of lisinopril and Lasix.  #5 hyperlipidemia Continue omega-3.  #6 prophylaxis SCDs for DVT prophylaxis.  Code Status: Full Family Communication: Updated patient no family at bedside. Disposition Plan: Back to her nursing facility tomorrow.   Consultants:  None  Procedures:  CT abdomen and pelvis 06/18/2014  Acute abdominal series 06/18/2014  Antibiotics:  None  HPI/Subjective: Patient denies any further emesis. No complaints.  Objective: Filed Vitals:   06/20/14 0601  BP: 132/89  Pulse: 63  Temp: 98.1 F (36.7 C)  Resp: 20    Intake/Output Summary (Last 24 hours) at 06/20/14 1504 Last data filed at 06/20/14 1000  Gross per 24 hour  Intake    240 ml  Output   2350 ml  Net  -2110 ml   Filed Weights   06/19/14 0700 06/20/14 0601  Weight: 90 kg (198 lb 6.6 oz) 90.8 kg (200 lb 2.8 oz)    Exam:   General:  NAD  Cardiovascular: RRR  Respiratory: CTAB in the anterior lung fields  Abdomen: Soft, nontender, nondistended, positive bowel sounds.  Musculoskeletal: No clubbing cyanosis or edema  Data Reviewed: Basic Metabolic  Panel:  Recent Labs Lab 06/18/14 1153 06/19/14 0513 06/20/14 0515  NA 138 138 133*  K 4.7 4.6 4.1  CL 100 102 98  CO2 GLUCOSE 81 80 83  BUN 24* 18 15  CREATININE 1.22 1.28 1.23  CALCIUM 9.5 9.0 9.2   Liver Function Tests:  Recent Labs Lab 06/18/14 1153 06/19/14 0513  AST 16 12  ALT 8 7  ALKPHOS 69 60  BILITOT 0.3 0.4  PROT 7.2 6.2  ALBUMIN 3.7 3.2*   No results found for this basename: LIPASE, AMYLASE,  in the last 168 hours No results found for this basename: AMMONIA,  in the last 168 hours CBC:  Recent Labs Lab 06/18/14 1153 06/19/14 0513 06/20/14 0515  WBC 8.3 5.7 6.2  NEUTROABS 5.2  --   --   HGB 12.3* 9.9* 9.7*  HCT 37.8* 30.6* 29.8*  MCV 91.1 91.1 90.9  PLT 148* 207 222   Cardiac Enzymes: No results found for this basename: CKTOTAL, CKMB, CKMBINDEX, TROPONINI,  in the last 168 hours BNP (last 3 results) No results found for this basename: PROBNP,  in the last 8760 hours CBG:  Recent Labs Lab 06/19/14 0727 06/19/14 0821 06/20/14 0550 06/20/14 0812 06/20/14 0953  GLUCAP 68* 92 78 66* 85    No results found for this or any previous visit (from the past 240 hour(s)).   Studies: No results found.  Scheduled Meds: . ARIPiprazole  30 mg Oral Daily  . cholecalciferol  1,000 Units Oral BID  . donepezil  10 mg Oral QHS  . ferrous sulfate  325 mg Oral QPC breakfast  . furosemide  40 mg Oral  Daily  . gabapentin  100 mg Oral QHS  . hydrocerin  1 application Topical Daily  . lisinopril  10 mg Oral Daily  . memantine  10 mg Oral BID  . omega-3 acid ethyl esters  1 g Oral Daily  . risperiDONE  1 mg Oral QHS  . risperiDONE  2 mg Oral Daily   Continuous Infusions:    Principal Problem:   Gastroenteritis: Probable Active Problems:   Vomiting   Hypertension   Anemia   Dementia   Acute encephalopathy   Other and unspecified hyperlipidemia    Time spent: 35 mins    [facility] MD [facility] Pager [phone]. If  7PM-7AM, please contact night-coverage at [email or web address], password [facility] 06/20/2014, 3:04 PM  LOS: 2 days

## 2014-06-20 NOTE — Progress Notes (Signed)
Clinical Social Work  CSW continues to try and reach guardian. Phone rings constantly and unable to leave a voicemail. CSW spoke with ALF who reports that is the only number for guardian but that guardian is not very involved and does not visit often. CSW will continue to try and reach guardian.  [place], [place] [phone]

## 2014-06-20 NOTE — Progress Notes (Signed)
Physical Therapy Treatment Patient Details Name: [name] MRN: [id number] DOB: August 15, 1951 Today's Date: 06-Jul-2014    History of Present Illness 63 y.o. male with h/o mental retardation, dementia, HTN admitted from ALF with vomiting.     PT Comments    Pt assisted with bed mobility today and required increased time and cues to process and perform tasks.  Follow Up Recommendations  SNF;Supervision/Assistance - 24 hour     Equipment Recommendations  Rolling walker with 5" wheels;Wheelchair (measurements PT)    Recommendations for Other Services       Precautions / Restrictions Precautions Precautions: Fall    Mobility  Bed Mobility Overal bed mobility: Needs Assistance;+2 for physical assistance Bed Mobility: Rolling;Sidelying to Sit;Sit to Sidelying Rolling: Max assist;+2 for physical assistance Sidelying to sit: Mod assist;+2 for physical assistance;HOB elevated Supine to sit: Mod assist;+2 for physical assistance;HOB elevated     General bed mobility comments: verbal cues for technique, increased time to process and initiate cues however pt attempting to assist, more assist required for lower body today, cues for using bed rails to assist  Transfers                    Ambulation/Gait                 Stairs            Wheelchair Mobility    Modified Rankin (Stroke Patients Only)       Balance Overall balance assessment: Needs assistance Sitting-balance support: Bilateral upper extremity supported;Feet supported Sitting balance-Leahy Scale: Poor Sitting balance - Comments: pt requires UE support to maintain trunk upright                            Cognition Arousal/Alertness: Awake/alert Behavior During Therapy: Flat affect Overall Cognitive Status: No family/caregiver present to determine baseline cognitive functioning (h/o mental retardation, unclear if he's at baseline)                      Exercises       General Comments        Pertinent Vitals/Pain      Home Living                      Prior Function            PT Goals (current goals can now be found in the care plan section) Progress towards PT goals: Progressing toward goals    Frequency  Min 2X/week    PT Plan Frequency needs to be updated    Co-evaluation             End of Session   Activity Tolerance: Patient limited by fatigue Patient left: in bed;with bed alarm set;with call bell/phone within reach     Time: 1319-1345 PT Time Calculation (min): 26 min  Charges:  $Therapeutic Activity: 23-37 mins                    G Codes:      Aylinn Rydberg,KATHrine E 2014/07/06, 2:26 PM Zenovia Jarred, PT, DPT 07/06/14 Pager: [phone]

## 2014-06-20 NOTE — Progress Notes (Signed)
Clinical Social Work  CSW continues to try and reach patient's guardian with no success. Per MD, patient should be ready to DC tomorrow. CSW spoke with ALF re: patient working with PT and his current needs. ALF aware of +2 assist and need for assistance with ADL. Rebecca at ALF reports that patient is at baseline and they can accept back when stable. CSW to fax information to [phone]. CSW will continue to follow and try to get in touch with guardian.  [place], [place] [phone]

## 2014-06-21 LAB — CBC
HCT: 32.7 % — ABNORMAL LOW (ref 39.0–52.0)
HEMOGLOBIN: 10.9 g/dL — AB (ref 13.0–17.0)
MCH: 29.5 pg (ref 26.0–34.0)
MCHC: 33.3 g/dL (ref 30.0–36.0)
MCV: 88.6 fL (ref 78.0–100.0)
Platelets: 228 10*3/uL (ref 150–400)
RBC: 3.69 MIL/uL — ABNORMAL LOW (ref 4.22–5.81)
RDW: 15 % (ref 11.5–15.5)
WBC: 6.4 10*3/uL (ref 4.0–10.5)

## 2014-06-21 LAB — GLUCOSE, CAPILLARY: GLUCOSE-CAPILLARY: 98 mg/dL (ref 70–99)

## 2014-06-21 MED ORDER — LORAZEPAM 0.5 MG PO TABS: 0.5000 mg | ORAL_TABLET | ORAL | Status: DC | PRN

## 2014-06-21 NOTE — Discharge Summary (Signed)
Physician Discharge Summary  Kaysin Brock ZOX:[id number] DOB: 09/14/1951 DOA: 06/18/2014  PCP: Colon Branch, MD  Admit date: 06/18/2014 Discharge date: 06/21/2014  Time spent: 65 minutes  Recommendations for Outpatient Follow-up:  1. Patient is to be discharged to assisted living facility were going to provide patient's PT needs. 2. Followup with M.D. at skilled nursing facility.  Discharge Diagnoses:  Principal Problem:   Gastroenteritis: Probable Active Problems:   Vomiting   Hypertension   Anemia   Dementia   Acute encephalopathy   Other and unspecified hyperlipidemia   Discharge Condition: Stable and improved  Diet recommendation: Dysphagia 3 diet.  Filed Weights   06/19/14 0700 06/20/14 0601  Weight: 90 kg (198 lb 6.6 oz) 90.8 kg (200 lb 2.8 oz)    History of present illness:  63 year old male with past medical history of dementia, hypertension, dyslipidemia who presented to [facility] ED 06/18/2014 from SNF with bilious emesis for past 24 hours prior to this admission as reported by SNF staff. In addition, SNF staff reported his mental status was little different, he was not acting himself. No reports of fevers, respiratory distress. Pt is not a good historian due to dementia.  In ED, Pt was hemodynamically stable. BP was 123/80, HR 92, RR 16, T max 98.3 F. Blood work showed mild anemia of 12.3, platelets of 148 otherwise unremarkable. Ct abdomen showed possible enteritis and possible appendicitis. Surgery has reviewed the CT abdomen and did not feel pt has surgical appendicitis. Abdominal X ray showed no obstruction.    Hospital Course:  #1 emesis/probable gastroenteritis  Patient had presented from the nursing facility with painless emesis about 24 hour period. Patient was admitted placed on the med surge floor. CT of the abdomen and pelvis which was done with no significant abnormalities however consistent with a possible enteritis. The admitting physician stated that Gen. surgery  was consulted to look at films and felt patient did not have appendicitis. Patient was admitted placed on IV fluids started on clear liquids and diet advanced. Patient did not have any further nausea or emesis for the rest of the hospitalization. Patient was tolerating a solid diet with no further emesis or nausea. Patient will be discharged in stable and improved condition.  #2 anemia  Likely dilutional component. Patient with no overt bleeding. Anemia panel consistent with anemia of chronic disease. Hemoglobin remained stable throughout the hospitalization. #3 dementia/acute encephalopathy  Namenda and Aricept, risperdal and abilify were resumed once patient was tolerating oral intake. Patient remained stable.  #4 hypertension  Stable. Continued on home regimen of lisinopril and Lasix.  #5 hyperlipidemia  Continued on home regimen of omega-3.   Procedures: CT abdomen and pelvis 06/18/2014  Acute abdominal series 06/18/2014     Consultations:  None  Discharge Exam: Filed Vitals:   06/21/14 0648  BP: 107/70  Pulse: 86  Temp: 98.2 F (36.8 C)  Resp: 18    General: NAD Cardiovascular: RRR Respiratory: CTAB anterior lung fields.  Discharge Instructions You were cared for by a hospitalist during your hospital stay. If you have any questions about your discharge medications or the care you received while you were in the hospital after you are discharged, you can call the unit and asked to speak with the hospitalist on call if the hospitalist that took care of you is not available. Once you are discharged, your primary care physician will handle any further medical issues. Please note that NO REFILLS for any discharge medications will be authorized  once you are discharged, as it is imperative that you return to your primary care physician (or establish a relationship with a primary care physician if you do not have one) for your aftercare needs so that they can reassess your need for  medications and monitor your lab values.  Discharge Instructions   Diet general    Complete by:  As directed   Dysphagia 3 diet.     Discharge instructions    Complete by:  As directed   Follow up with MD at SNF.     Increase activity slowly    Complete by:  As directed           Current Discharge Medication List    CONTINUE these medications which have CHANGED   Details  LORazepam (ATIVAN) 0.5 MG tablet Take 1 tablet (0.5 mg total) by mouth every 4 (four) hours as needed (for agitation). Qty: 20 tablet, Refills: 0      CONTINUE these medications which have NOT CHANGED   Details  ARIPiprazole (ABILIFY) 30 MG tablet Take 30 mg by mouth daily.    Cholecalciferol (VITAMIN D3) 1000 UNITS CAPS Take 1 capsule by mouth 2 (two) times daily.      donepezil (ARICEPT) 10 MG tablet Take 10 mg by mouth at bedtime.     ferrous sulfate 325 (65 FE) MG tablet Take 325 mg by mouth daily with breakfast.      furosemide (LASIX) 40 MG tablet Take 40 mg by mouth daily.    gabapentin (NEURONTIN) 100 MG capsule Take 100 mg by mouth at bedtime.      lisinopril (PRINIVIL,ZESTRIL) 10 MG tablet Take 10 mg by mouth daily.      memantine (NAMENDA) 10 MG tablet Take 10 mg by mouth 2 (two) times daily.      Omega-3 Fatty Acids (FISH OIL) 1200 MG CAPS Take 1 capsule by mouth daily.      potassium chloride (KLOR-CON) 10 MEQ CR tablet Take 10 mEq by mouth daily.      !! risperiDONE (RISPERDAL) 1 MG tablet Take 1 mg by mouth at bedtime.    !! risperiDONE (RISPERDAL) 2 MG tablet Take 2 mg by mouth daily with breakfast.    Skin Protectants, Misc. (EUCERIN) cream Apply 1 application topically daily.     !! - Potential duplicate medications found. Please discuss with provider.     No Known Allergies Follow-up Information   Please follow up. (f/u with MD at SNF)        The results of significant diagnostics from this hospitalization (including imaging, microbiology, ancillary and laboratory) are  listed below for reference.    Significant Diagnostic Studies: Ct Abdomen Pelvis W Contrast  06/18/2014   CLINICAL DATA:  Vomiting episode this morning. Depressed mental status.  EXAM: CT ABDOMEN AND PELVIS WITH CONTRAST  TECHNIQUE: Multidetector CT imaging of the abdomen and pelvis was performed using the standard protocol following bolus administration of intravenous contrast.  CONTRAST:  50mL OMNIPAQUE IOHEXOL 300 MG/ML SOLN, OMNIPAQUE IOHEXOL 300 MG/ML SOLN  COMPARISON:  12/30/2011  FINDINGS: In the left mid abdomen there are loops of small bowel which show mild wall thickening. Minor stranding is seen in the adjacent mesenteric. There is no wall air or extraluminal air. There is no evidence of obstruction. The remainder of the small bowel is unremarkable. Colon is unremarkable. The appendix is visualized. It measures mildly thickened, just under 8 mm, but the wall appears normal thickness and areas appendiceal air. This  is most likely a normal variant for this patient.  Lung bases show a minimal right effusion and dependent coarse reticular type opacity most consistent with atelectasis, scarring or a combination. Calcified plaque is noted along the right diaphragmatic surface. Heart is normal in size.  Liver shows a stable low-density lesion and dome consistent with a cyst. Liver otherwise unremarkable.  Spleen, pancreas and gallbladder are unremarkable. No bile duct dilation no adrenal masses.  Multiple low-density renal lesions are noted consistent with cysts. There are single nonobstructing stones in the upper poles of each kidney. There is no hydronephrosis. Normal ureters. Bladder is unremarkable.  No pathologically enlarged lymph nodes. There is no ascites. Vena cava filter has its superior margin just below the right renal vein.  Prominent varicosities are noted in the subcutaneous soft tissues of the anterior, inferior abdominal wall.  There are advanced arthropathic changes of the right hip  with significant, but less prominent, arthropathic changes of the left hip. Mild degenerative changes are noted along the visualized spine.  IMPRESSION: 1. As suggested on standard radiographs, there are loops small bowel in the left mid abdomen which showed wall thickening. This is nonspecific consistent with an infectious or inflammatory enteritis. No abscess or extraluminal air. 2. Appendix measures mildly dilated common just under 8 mm, but the appendix is otherwise unremarkable with no wall thickening and with luminal air. Unless there are clinical findings suspicious for appendicitis, this should be considered a normal variant for this patient. 3. Lung base atelectasis and/ scarring. 4. Multiple renal low-density lesions, most likely all cysts. Small nonobstructing stones in each kidney. Small presumed liver cyst. These findings are stable. 5. Other chronic findings as detailed.   Electronically Signed   By: Amie Portland M.D.   On: 06/18/2014 15:04   Dg Abd Acute W/chest  06/18/2014   CLINICAL DATA:  Abdominal pain.  Emesis.  EXAM: ACUTE ABDOMEN SERIES (ABDOMEN 2 VIEW & CHEST 1 VIEW)  COMPARISON:  CT, 12/30/2011.  FINDINGS: There is no significant bowel dilation to suggest obstruction. There are prominent loops of small bowel in the left central abdomen to left upper quadrant with some questionable fold thickening. No free air is seen. There are few nonspecific air-fluid levels in the left abdomen on the decubitus view.  A vena cava filter is stable from the prior CT. Soft tissues are otherwise unremarkable.  Frontal chest radiograph shows no lung consolidation or edema. Cardiac silhouette is normal in size. No mediastinal or hilar masses.  There are degenerative changes noted along the spine with more significant arthropathic changes involving the right hip, with milder changes involving the left hip.  IMPRESSION: 1. No obstruction or free air. 2. Prominent small bowel in the left central to left upper  quadrant region of the abdomen with a few nonspecific air-fluid levels. There is questionable fold thickening of these loops. This may reflect an inflammatory or infectious enteritis. Followup abdomen and pelvis CT with contrast should be considered.   Electronically Signed   By: Amie Portland M.D.   On: 06/18/2014 12:34    Microbiology: No results found for this or any previous visit (from the past 240 hour(s)).   Labs: Basic Metabolic Panel:  Recent Labs Lab 06/18/14 1153 06/19/14 0513 06/20/14 0515  NA 138 138 133*  K 4.7 4.6 4.1  CL 100 102 98  CO2 GLUCOSE 81 80 83  BUN 24* 18 15  CREATININE 1.22 1.28 1.23  CALCIUM 9.5 9.0 9.2  Liver Function Tests:  Recent Labs Lab 06/18/14 1153 06/19/14 0513  AST 16 12  ALT 8 7  ALKPHOS 69 60  BILITOT 0.3 0.4  PROT 7.2 6.2  ALBUMIN 3.7 3.2*   No results found for this basename: LIPASE, AMYLASE,  in the last 168 hours No results found for this basename: AMMONIA,  in the last 168 hours CBC:  Recent Labs Lab 06/18/14 1153 06/19/14 0513 06/20/14 0515 06/21/14 0443  WBC 8.3 5.7 6.2 6.4  NEUTROABS 5.2  --   --   --   HGB 12.3* 9.9* 9.7* 10.9*  HCT 37.8* 30.6* 29.8* 32.7*  MCV 91.1 91.1 90.9 88.6  PLT 148* 207 222 228   Cardiac Enzymes: No results found for this basename: CKTOTAL, CKMB, CKMBINDEX, TROPONINI,  in the last 168 hours BNP: BNP (last 3 results) No results found for this basename: PROBNP,  in the last 8760 hours CBG:  Recent Labs Lab 06/19/14 0821 06/20/14 0550 06/20/14 0812 06/20/14 0953 06/21/14 0723  GLUCAP 92 78 66* 85 98       Signed:  Shakerria Parran MD [facility] 06/21/2014, 11:44 AM

## 2014-06-21 NOTE — Progress Notes (Signed)
Patient discharge to ALF, alert and oriented, transferred by Medical Transportation, discharge package given for delivery to ALF, patient dressed in paper gown, IV discontinued, and hat placed on head, patient in stable condition at this time and ready for discharge, patient unable to sign orders due to illness

## 2014-06-21 NOTE — Progress Notes (Addendum)
Clinical Social Work  CSW faxed DC summary and FL2 to [facility] and spoke with Lurena Joiner who is agreeable to accept today. CSW prepared DC packet with FL2, DC summary and hard scripts included. CSW spoke with director [facility]) re: CSW inability to reach guardian to discuss DC plans. CSW attempted to call guardian 3 times today and sent a text to discuss DC plans. CSW unable to leave message on guardian's number. ALF reports no alternative numbers for guardian and reports they will update her on plans if they are able to reach her.   Patient confused but aware of DC plans and reports he is happy to be going back to ALF.  CSW arranged transportation via [facility] for 1 pm pick up and RN aware. [facility] #: [id number]  CSW is signing off but available if needed.  [place], [place] [phone]

## 2015-05-31 ENCOUNTER — Emergency Department (HOSPITAL_COMMUNITY)
Admission: EM | Admit: 2015-05-31 | Discharge: 2015-05-31 | Disposition: A | Discharge status: Home / Self Care | Payer: Medicare Other | Attending: Emergency Medicine | Admitting: Emergency Medicine

## 2015-05-31 ENCOUNTER — Emergency Department (HOSPITAL_BASED_OUTPATIENT_CLINIC_OR_DEPARTMENT_OTHER)
Admit: 2015-05-31 | Discharge: 2015-05-31 | Disposition: A | Discharge status: Home / Self Care | Payer: Medicare Other | Attending: Emergency Medicine | Admitting: Emergency Medicine

## 2015-05-31 ENCOUNTER — Emergency Department (HOSPITAL_COMMUNITY): Payer: Medicare Other

## 2015-05-31 ENCOUNTER — Encounter (HOSPITAL_COMMUNITY): Payer: Self-pay | Admitting: Emergency Medicine

## 2015-05-31 DIAGNOSIS — R2242 Localized swelling, mass and lump, left lower limb: Secondary | ICD-10-CM | POA: Diagnosis present

## 2015-05-31 DIAGNOSIS — F0390 Unspecified dementia without behavioral disturbance: Secondary | ICD-10-CM | POA: Insufficient documentation

## 2015-05-31 DIAGNOSIS — I82401 Acute embolism and thrombosis of unspecified deep veins of right lower extremity: Secondary | ICD-10-CM | POA: Insufficient documentation

## 2015-05-31 DIAGNOSIS — Z79899 Other long term (current) drug therapy: Secondary | ICD-10-CM | POA: Insufficient documentation

## 2015-05-31 DIAGNOSIS — I10 Essential (primary) hypertension: Secondary | ICD-10-CM | POA: Insufficient documentation

## 2015-05-31 DIAGNOSIS — M7989 Other specified soft tissue disorders: Secondary | ICD-10-CM

## 2015-05-31 DIAGNOSIS — I82402 Acute embolism and thrombosis of unspecified deep veins of left lower extremity: Secondary | ICD-10-CM

## 2015-05-31 DIAGNOSIS — T148 Other injury of unspecified body region: Secondary | ICD-10-CM | POA: Insufficient documentation

## 2015-05-31 DIAGNOSIS — E669 Obesity, unspecified: Secondary | ICD-10-CM | POA: Insufficient documentation

## 2015-05-31 LAB — CBC WITH DIFFERENTIAL/PLATELET
BASOS ABS: 0 10*3/uL (ref 0.0–0.1)
BASOS PCT: 0 % (ref 0–1)
EOS ABS: 0.3 10*3/uL (ref 0.0–0.7)
Eosinophils Relative: 4 % (ref 0–5)
HEMATOCRIT: 33.5 % — AB (ref 39.0–52.0)
HEMOGLOBIN: 10.9 g/dL — AB (ref 13.0–17.0)
Lymphocytes Relative: 28 % (ref 12–46)
Lymphs Abs: 2 10*3/uL (ref 0.7–4.0)
MCH: 29.4 pg (ref 26.0–34.0)
MCHC: 32.5 g/dL (ref 30.0–36.0)
MCV: 90.3 fL (ref 78.0–100.0)
Monocytes Absolute: 0.9 10*3/uL (ref 0.1–1.0)
Monocytes Relative: 12 % (ref 3–12)
NEUTROS ABS: 4.1 10*3/uL (ref 1.7–7.7)
NEUTROS PCT: 56 % (ref 43–77)
Platelets: 202 10*3/uL (ref 150–400)
RBC: 3.71 MIL/uL — AB (ref 4.22–5.81)
RDW: 15.3 % (ref 11.5–15.5)
WBC: 7.2 10*3/uL (ref 4.0–10.5)

## 2015-05-31 LAB — COMPREHENSIVE METABOLIC PANEL
ALBUMIN: 3.7 g/dL (ref 3.5–5.0)
ALK PHOS: 70 U/L (ref 38–126)
ALT: 9 U/L — ABNORMAL LOW (ref 17–63)
ANION GAP: 10 (ref 5–15)
AST: 16 U/L (ref 15–41)
BILIRUBIN TOTAL: 0.5 mg/dL (ref 0.3–1.2)
BUN: 19 mg/dL (ref 6–20)
CALCIUM: 9.5 mg/dL (ref 8.9–10.3)
CO2: 27 mmol/L (ref 22–32)
Chloride: 101 mmol/L (ref 101–111)
Creatinine, Ser: 1.47 mg/dL — ABNORMAL HIGH (ref 0.61–1.24)
GFR calc Af Amer: 57 mL/min — ABNORMAL LOW (ref >60)
GFR calc non Af Amer: 49 mL/min — ABNORMAL LOW (ref >60)
GLUCOSE: 81 mg/dL (ref 65–99)
POTASSIUM: 4.2 mmol/L (ref 3.5–5.1)
SODIUM: 138 mmol/L (ref 135–145)
TOTAL PROTEIN: 7 g/dL (ref 6.5–8.1)

## 2015-05-31 MED ORDER — ENOXAPARIN SODIUM 150 MG/ML ~~LOC~~ SOLN
150.0000 mg | Freq: Once | SUBCUTANEOUS | Status: AC
  Administered 2015-05-31: 150 mg via SUBCUTANEOUS
  Filled 2015-05-31: qty 1

## 2015-05-31 MED ORDER — ENOXAPARIN SODIUM 100 MG/ML ~~LOC~~ SOLN: 150.0000 mg | SUBCUTANEOUS | Status: DC

## 2015-05-31 NOTE — ED Notes (Signed)
[facility] requested for transport 

## 2015-05-31 NOTE — ED Notes (Addendum)
From [facility]; sent for evaluation of right leg swelling greater than left. Skin tear to right leg from rapid swelling. Noticed by staff today. Patient alert and oriented x1,smiles/laughs, advanced dementia and MR (usual posture to lean and drool per facility).

## 2015-05-31 NOTE — Discharge Instructions (Signed)
Take lovenox 150 mg daily.   You likely have a chronic blood clot. There may be a small acute blood clot.   You need to see your doctor next week to discuss long term options.   Use lovenox SQ daily until you talk to your doctor.   Please elevate his legs.   Return to ER if he has worse leg swelling, trouble breathing, shortness of breath, chest pain.

## 2015-05-31 NOTE — Progress Notes (Signed)
*  PRELIMINARY RESULTS* Vascular Ultrasound Right lower extremity venous duplex has been completed.  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  Study from 06/2011 demonstrates chronic thrombosis in these areas as well. Most likely unchanged. Poor visualization of the calf veins.    Farrel Demark, RDMS, RVT  05/31/2015, 3:39 PM

## 2015-05-31 NOTE — ED Notes (Signed)
Patient returned from X-ray. Vascular tech at bedside to do DVT study.

## 2015-05-31 NOTE — ED Provider Notes (Addendum)
CSN: [id number]     Arrival date & time 05/31/15  1256 History   First MD Initiated Contact with Patient 05/31/15 1329     Chief Complaint  Patient presents with  . Leg Swelling     (Consider location/radiation/quality/duration/timing/severity/associated sxs/prior Treatment) The history is provided by the EMS personnel.  [name] is a 64 y.o. male hx of mental retardation, DVT not on anticoagulation, dementia from nursing home, here with right leg swelling. Has chronic bilateral leg swelling but right leg was noted to be more swollen today. Do not know how long the right leg has been more swollen. Nursing home denies any worsening trouble breathing, patient drools at baseline. Patient has no complaints. Also was noted to have an abrasion on the right foot but there is no reported injury.    Level V caveat- mental retardation, dementia  Past Medical History  Diagnosis Date  . Mental retardation   . DVT (deep venous thrombosis)     right leg  . Dyslipidemia   . Obesity   . Hypertension   . Dementia 06/19/2014  . Other and unspecified hyperlipidemia 06/19/2014   History reviewed. No pertinent past surgical history. Family History  Problem Relation Age of Onset  . Hypertension     Social History  Substance Use Topics  . Smoking status: Never Smoker   . Smokeless tobacco: Never Used  . Alcohol Use: No    Review of Systems  Cardiovascular: Positive for leg swelling.  All other systems reviewed and are negative.     Allergies  Review of patient's allergies indicates no known allergies.  Home Medications   Prior to Admission medications   Medication Sig Start Date End Date Taking? Authorizing Provider  ARIPiprazole (ABILIFY) 30 MG tablet Take 30 mg by mouth daily.    Historical Provider, MD  Cholecalciferol (VITAMIN D3) 1000 UNITS CAPS Take 1 capsule by mouth 2 (two) times daily.      Historical Provider, MD  donepezil (ARICEPT) 10 MG tablet Take 10 mg by mouth at  bedtime.     Historical Provider, MD  ferrous sulfate 325 (65 FE) MG tablet Take 325 mg by mouth daily with breakfast.      Historical Provider, MD  furosemide (LASIX) 40 MG tablet Take 40 mg by mouth daily.    Historical Provider, MD  gabapentin (NEURONTIN) 100 MG capsule Take 100 mg by mouth at bedtime.      Historical Provider, MD  lisinopril (PRINIVIL,ZESTRIL) 10 MG tablet Take 10 mg by mouth daily.      Historical Provider, MD  LORazepam (ATIVAN) 0.5 MG tablet Take 1 tablet (0.5 mg total) by mouth every 4 (four) hours as needed (for agitation). 06/21/14   Rodolph Bong, MD  memantine (NAMENDA) 10 MG tablet Take 10 mg by mouth 2 (two) times daily.      Historical Provider, MD  Omega-3 Fatty Acids (FISH OIL) 1200 MG CAPS Take 1 capsule by mouth daily.      Historical Provider, MD  potassium chloride (KLOR-CON) 10 MEQ CR tablet Take 10 mEq by mouth daily.      Historical Provider, MD  risperiDONE (RISPERDAL) 1 MG tablet Take 1 mg by mouth at bedtime.    Historical Provider, MD  risperiDONE (RISPERDAL) 2 MG tablet Take 2 mg by mouth daily with breakfast.    Historical Provider, MD  Skin Protectants, Misc. (EUCERIN) cream Apply 1 application topically daily.    Historical Provider, MD   BP 127/81 mmHg  Pulse 79  Temp(Src) 97.8 F (36.6 C) (Oral)  Resp 13  Ht 5\' 11"  (1.803 m)  SpO2 100% Physical Exam  Constitutional:  Demented   HENT:  Head: Normocephalic.  Mouth/Throat: Oropharynx is clear and moist.  Eyes: Conjunctivae are normal. Pupils are equal, round, and reactive to light.  Neck: Normal range of motion. Neck supple.  Cardiovascular: Normal rate, regular rhythm and normal heart sounds.   Pulmonary/Chest: Effort normal and breath sounds normal. No respiratory distress. He has no wheezes. He has no rales.  Abdominal: Soft. Bowel sounds are normal. He exhibits no distension. There is no tenderness. There is no rebound.  Musculoskeletal:  R leg swollen with minimal calf  tenderness. Skin tear dorsum R foot. No obvious bony deformity   Neurological: He is alert.  Skin: Skin is warm and dry.  Psychiatric:  Unable   Nursing note and vitals reviewed.   ED Course  Procedures (including critical care time) Labs Review Labs Reviewed  CBC WITH DIFFERENTIAL/PLATELET - Abnormal; Notable for the following:    RBC 3.71 (*)    Hemoglobin 10.9 (*)    HCT 33.5 (*)    All other components within normal limits  COMPREHENSIVE METABOLIC PANEL - Abnormal; Notable for the following:    Creatinine, Ser 1.47 (*)    ALT 9 (*)    GFR calc non Af Amer 49 (*)    GFR calc Af Amer 57 (*)    All other components within normal limits    Imaging Review Dg Tibia/fibula Right  05/31/2015   CLINICAL DATA:  Leg swelling, known known injury, initial encounter  EXAM: RIGHT TIBIA AND FIBULA - 2 VIEW  COMPARISON:  None.  FINDINGS: Degenerative changes are noted with some spurring from the patella. No acute fracture or dislocation is noted. Degenerative changes in the tarsal bones are noted as well. No gross soft tissue abnormality is seen.  IMPRESSION: Degenerative change without acute abnormality.   Electronically Signed   By: Alcide Clever M.D.   On: 05/31/2015 15:42   Dg Foot 2 Views Right  05/31/2015   CLINICAL DATA:  64 year old male with a history of leg edema.  EXAM: RIGHT FOOT - 2 VIEW  COMPARISON:  None.  FINDINGS: No acute fracture. Soft tissue swelling at the foot and distal ankle. Skin disruption at the instep of the ankle on the lateral view.  Degenerative changes of the forefoot, hindfoot, and midfoot.  IMPRESSION: Negative for acute bony abnormality.  Soft tissue swelling of the foot and visualized ankle.  Soft tissue defect on the instep of the ankle, amenable to direct inspection.  Signed,  Yvone Neu. Loreta Ave, DO  [facility]  [facility]   Electronically Signed   By: Gilmer Mor D.O.   On: 05/31/2015 15:43   I have personally  reviewed and evaluated these images and lab results as part of my medical decision-making.   EKG Interpretation None      MDM   Final diagnoses:  Leg swelling   [name] is a 64 y.o. male here with R leg swelling. Hx of DVT not on anticoagulation. Will get DVT study. No reported SOB and not tachy or hypoxic and I doubt PE.   4:38 PM xrays unremarkable. WBC nl. No signs of cellulitis. Cr 1.5, baseline 1.2. No signs of PE. US showed likely chronic DVT not sure if there is a small acute DVT. Of note patient was on Coumadin until 2012. Patient was on  full dose aspirin since and now on nothing due to fall risk. Discussed case with hematology Dr. Candise Che. He states that patient's GFR is around 50 and can still get xarelto if the benefit outweighs the risks. Also can do lovenox and have him see PCP for discussion for long term anticoagulation. Discussed with pharmacy at length and given GFR 50, can still get lovenox 1.5 mg/kg daily dose until he follows up with PCP for discussion about anticoagulation.      Richardean Canal, MD 05/31/15 1641  Richardean Canal, MD 05/31/15 [phone]

## 2015-05-31 NOTE — ED Notes (Signed)
Contacted [facility] to tx patient back to [facility]

## 2015-05-31 NOTE — ED Notes (Signed)
Patient transported to X-ray 

## 2015-06-10 ENCOUNTER — Inpatient Hospital Stay (HOSPITAL_COMMUNITY)
Admission: EM | Admit: 2015-06-10 | Discharge: 2015-06-13 | DRG: 300 | Disposition: A | Discharge status: Skilled Nursing Facility | Payer: Medicare Other | Attending: Family Medicine | Admitting: Family Medicine

## 2015-06-10 ENCOUNTER — Encounter (HOSPITAL_COMMUNITY): Payer: Self-pay | Admitting: Emergency Medicine

## 2015-06-10 DIAGNOSIS — I10 Essential (primary) hypertension: Secondary | ICD-10-CM | POA: Diagnosis present

## 2015-06-10 DIAGNOSIS — N179 Acute kidney failure, unspecified: Secondary | ICD-10-CM | POA: Diagnosis present

## 2015-06-10 DIAGNOSIS — F0390 Unspecified dementia without behavioral disturbance: Secondary | ICD-10-CM | POA: Diagnosis present

## 2015-06-10 DIAGNOSIS — Z7901 Long term (current) use of anticoagulants: Secondary | ICD-10-CM

## 2015-06-10 DIAGNOSIS — E785 Hyperlipidemia, unspecified: Secondary | ICD-10-CM | POA: Diagnosis present

## 2015-06-10 DIAGNOSIS — I82409 Acute embolism and thrombosis of unspecified deep veins of unspecified lower extremity: Secondary | ICD-10-CM | POA: Diagnosis present

## 2015-06-10 DIAGNOSIS — R600 Localized edema: Secondary | ICD-10-CM | POA: Diagnosis present

## 2015-06-10 DIAGNOSIS — I82401 Acute embolism and thrombosis of unspecified deep veins of right lower extremity: Secondary | ICD-10-CM | POA: Diagnosis not present

## 2015-06-10 DIAGNOSIS — Z79899 Other long term (current) drug therapy: Secondary | ICD-10-CM

## 2015-06-10 DIAGNOSIS — M7989 Other specified soft tissue disorders: Secondary | ICD-10-CM | POA: Diagnosis present

## 2015-06-10 DIAGNOSIS — N183 Chronic kidney disease, stage 3 unspecified: Secondary | ICD-10-CM

## 2015-06-10 DIAGNOSIS — I129 Hypertensive chronic kidney disease with stage 1 through stage 4 chronic kidney disease, or unspecified chronic kidney disease: Secondary | ICD-10-CM | POA: Diagnosis present

## 2015-06-10 DIAGNOSIS — F79 Unspecified intellectual disabilities: Secondary | ICD-10-CM | POA: Diagnosis present

## 2015-06-10 DIAGNOSIS — Z6830 Body mass index (BMI) 30.0-30.9, adult: Secondary | ICD-10-CM

## 2015-06-10 DIAGNOSIS — K529 Noninfective gastroenteritis and colitis, unspecified: Secondary | ICD-10-CM | POA: Diagnosis present

## 2015-06-10 DIAGNOSIS — Z8249 Family history of ischemic heart disease and other diseases of the circulatory system: Secondary | ICD-10-CM

## 2015-06-10 DIAGNOSIS — E669 Obesity, unspecified: Secondary | ICD-10-CM | POA: Diagnosis present

## 2015-06-10 LAB — CBC WITH DIFFERENTIAL/PLATELET
Basophils Absolute: 0 10*3/uL (ref 0.0–0.1)
Basophils Relative: 0 % (ref 0–1)
EOS ABS: 0.2 10*3/uL (ref 0.0–0.7)
EOS PCT: 3 % (ref 0–5)
HCT: 32 % — ABNORMAL LOW (ref 39.0–52.0)
Hemoglobin: 10.3 g/dL — ABNORMAL LOW (ref 13.0–17.0)
LYMPHS ABS: 1.8 10*3/uL (ref 0.7–4.0)
Lymphocytes Relative: 28 % (ref 12–46)
MCH: 29.5 pg (ref 26.0–34.0)
MCHC: 32.2 g/dL (ref 30.0–36.0)
MCV: 91.7 fL (ref 78.0–100.0)
MONOS PCT: 11 % (ref 3–12)
Monocytes Absolute: 0.7 10*3/uL (ref 0.1–1.0)
Neutro Abs: 3.7 10*3/uL (ref 1.7–7.7)
Neutrophils Relative %: 58 % (ref 43–77)
PLATELETS: 208 10*3/uL (ref 150–400)
RBC: 3.49 MIL/uL — ABNORMAL LOW (ref 4.22–5.81)
RDW: 15.1 % (ref 11.5–15.5)
WBC: 6.4 10*3/uL (ref 4.0–10.5)

## 2015-06-10 LAB — I-STAT CG4 LACTIC ACID, ED: LACTIC ACID, VENOUS: 0.78 mmol/L (ref 0.5–2.0)

## 2015-06-10 MED ORDER — SULFAMETHOXAZOLE-TRIMETHOPRIM 800-160 MG PO TABS
1.0000 | ORAL_TABLET | ORAL | Status: AC
  Administered 2015-06-11: 1 via ORAL
  Filled 2015-06-10: qty 1

## 2015-06-10 NOTE — ED Provider Notes (Signed)
CSN: [id number]     Arrival date & time 06/10/15  2157 History   First MD Initiated Contact with Patient 06/10/15 2214     Chief Complaint  Patient presents with  . Leg Swelling  . Extremity Laceration     (Consider location/radiation/quality/duration/timing/severity/associated sxs/prior Treatment) The history is provided by the patient. No language interpreter was used.  [name] is a 64 year old male with a history of mental retardation, DVT, dyslipidemia, and hypertension who presents by EMS from [facility] facility for increased right leg swelling and right leg laceration. He has chronic bilateral leg edema but was noted to have more swelling today. Patient has no complaints except that he is tired.  Level V caveat - mental retardation and dementia Past Medical History  Diagnosis Date  . Mental retardation   . DVT (deep venous thrombosis)     right leg  . Dyslipidemia   . Obesity   . Hypertension   . Dementia 06/19/2014  . Other and unspecified hyperlipidemia 06/19/2014   History reviewed. No pertinent past surgical history. Family History  Problem Relation Age of Onset  . Hypertension     Social History  Substance Use Topics  . Smoking status: Never Smoker   . Smokeless tobacco: Never Used  . Alcohol Use: No    Review of Systems  Unable to perform ROS Cardiovascular: Positive for leg swelling.      Allergies  Review of patient's allergies indicates no known allergies.  Home Medications   Prior to Admission medications   Medication Sig Start Date End Date Taking? Authorizing Provider  ARIPiprazole (ABILIFY) 30 MG tablet Take 30 mg by mouth daily.   Yes Historical Provider, MD  Cholecalciferol (VITAMIN D3) 1000 UNITS CAPS Take 1 capsule by mouth 2 (two) times daily.     Yes Historical Provider, MD  donepezil (ARICEPT) 10 MG tablet Take 10 mg by mouth at bedtime.    Yes Historical Provider, MD  ferrous sulfate 325 (65 FE) MG tablet Take 325 mg by mouth daily  with breakfast.     Yes Historical Provider, MD  furosemide (LASIX) 40 MG tablet Take 40 mg by mouth daily.   Yes Historical Provider, MD  gabapentin (NEURONTIN) 100 MG capsule Take 100 mg by mouth at bedtime.     Yes Historical Provider, MD  lisinopril (PRINIVIL,ZESTRIL) 10 MG tablet Take 10 mg by mouth daily.     Yes Historical Provider, MD  LORazepam (ATIVAN) 0.5 MG tablet Take 1 tablet (0.5 mg total) by mouth every 4 (four) hours as needed (for agitation). 06/21/14  Yes Rodolph Bong, MD  memantine (NAMENDA) 10 MG tablet Take 10 mg by mouth 2 (two) times daily.     Yes Historical Provider, MD  Omega-3 Fatty Acids (FISH OIL) 1200 MG CAPS Take 1 capsule by mouth daily.     Yes Historical Provider, MD  potassium chloride (KLOR-CON) 10 MEQ CR tablet Take 10 mEq by mouth daily.     Yes Historical Provider, MD  risperiDONE (RISPERDAL) 1 MG tablet Take 1 mg by mouth at bedtime.   Yes Historical Provider, MD  risperiDONE (RISPERDAL) 2 MG tablet Take 2 mg by mouth daily with breakfast.   Yes Historical Provider, MD  Rivaroxaban (XARELTO) 15 MG TABS tablet Take 15 mg by mouth 2 (two) times daily with a meal.   Yes Historical Provider, MD  enoxaparin (LOVENOX) 100 MG/ML injection Inject 1.5 mLs (150 mg total) into the skin daily. Patient not taking: Reported  on 06/10/2015 05/31/15   Richardean Canal, MD   BP 142/78 mmHg  Pulse 88  Temp(Src) 98.1 F (36.7 C) (Oral)  Resp 16  Ht 5\' 10"  (1.778 m)  Wt 212 lb (96.163 kg)  BMI 30.42 kg/m2  SpO2 96% Physical Exam  Constitutional: He appears well-developed and well-nourished. No distress.  HENT:  Head: Normocephalic and atraumatic.  Sleeping on exam but easily arousable.  Eyes: Conjunctivae are normal.  Neck: Normal range of motion. Neck supple.  Cardiovascular: Normal rate, regular rhythm and normal heart sounds.   Pulmonary/Chest: Effort normal and breath sounds normal.  Abdominal: Soft. There is no tenderness.  Musculoskeletal: He exhibits edema.   Significant edema of the right leg compared to the left from the groin to the foot.  There is a 1cm skin tear along the anterior right ankle most likely due to edema. No active bleeding. Good DP pulses bilaterally. The leg is soft to palpation.   Dry, cracked skin of bilateral feet and legs  Neurological: He is alert.  Skin: Skin is warm and dry.  Nursing note and vitals reviewed.   ED Course  Procedures (including critical care time) Labs Review Labs Reviewed  CBC WITH DIFFERENTIAL/PLATELET - Abnormal; Notable for the following:    RBC 3.49 (*)    Hemoglobin 10.3 (*)    HCT 32.0 (*)    All other components within normal limits  BASIC METABOLIC PANEL - Abnormal; Notable for the following:    BUN 23 (*)    Creatinine, Ser 1.44 (*)    GFR calc non Af Amer 51 (*)    GFR calc Af Amer 59 (*)    All other components within normal limits  PROTIME-INR - Abnormal; Notable for the following:    Prothrombin Time 20.0 (*)    INR 1.70 (*)    All other components within normal limits  HEPARIN LEVEL (UNFRACTIONATED) - Abnormal; Notable for the following:    Heparin Unfractionated 1.02 (*)    All other components within normal limits  CBC - Abnormal; Notable for the following:    RBC 3.44 (*)    Hemoglobin 9.8 (*)    HCT 31.1 (*)    All other components within normal limits  BASIC METABOLIC PANEL - Abnormal; Notable for the following:    BUN 21 (*)    Creatinine, Ser 1.33 (*)    GFR calc non Af Amer 56 (*)    All other components within normal limits  APTT - Abnormal; Notable for the following:    aPTT 55 (*)    All other components within normal limits  HEPARIN LEVEL (UNFRACTIONATED) - Abnormal; Notable for the following:    Heparin Unfractionated 0.94 (*)    All other components within normal limits  APTT - Abnormal; Notable for the following:    aPTT 101 (*)    All other components within normal limits  CBC  HEPARIN LEVEL (UNFRACTIONATED)  I-STAT CG4 LACTIC ACID, ED  I-STAT  CG4 LACTIC ACID, ED    Imaging Review No results found. I have personally reviewed and evaluated these lab results as part of my medical decision-making.   EKG Interpretation None      MDM   Final diagnoses:  Acute kidney injury  DVT of leg (deep venous thrombosis), right  Patient presents for right leg edema and skin tear to the right ankle. He had a right femoral and popliteal vein thrombosis diagnosed 10 days ago and was started on xarelto. His  labs show AKI but otherwise not concerning.  He was started on antibiotics heparin.  I spoke to the hospitalist to admit for AKI and DVT.     Catha Gosselin, PA-C 06/12/15 1610  Derwood Kaplan, MD 06/13/15 1714

## 2015-06-10 NOTE — ED Notes (Signed)
PER EMS: pt comes from [facility] facility - staff was changing pt's socks and noticed laceration on R upper foot that began bleeding.  Staff also reports R leg swelling x 1 day.  Per EMS, pt was seen here about a week ago for known DVT in same leg - pt was placed on Xarelto and sent home.  Pt hx heart disease, MR, dementia, on Lasix.  Pt A&O x 1 (pt baseline).  EMS VS: 142/86, HR 104, RR 16, CBG 186.  Pt c/o no pain, states he is "fine."  Bleeding controlled.

## 2015-06-11 ENCOUNTER — Encounter (HOSPITAL_COMMUNITY): Payer: Self-pay | Admitting: General Practice

## 2015-06-11 ENCOUNTER — Inpatient Hospital Stay (HOSPITAL_COMMUNITY): Payer: Medicare Other

## 2015-06-11 DIAGNOSIS — F79 Unspecified intellectual disabilities: Secondary | ICD-10-CM

## 2015-06-11 DIAGNOSIS — Z8249 Family history of ischemic heart disease and other diseases of the circulatory system: Secondary | ICD-10-CM | POA: Diagnosis not present

## 2015-06-11 DIAGNOSIS — I10 Essential (primary) hypertension: Secondary | ICD-10-CM | POA: Diagnosis not present

## 2015-06-11 DIAGNOSIS — N183 Chronic kidney disease, stage 3 unspecified: Secondary | ICD-10-CM

## 2015-06-11 DIAGNOSIS — I82409 Acute embolism and thrombosis of unspecified deep veins of unspecified lower extremity: Secondary | ICD-10-CM | POA: Diagnosis not present

## 2015-06-11 DIAGNOSIS — M7989 Other specified soft tissue disorders: Secondary | ICD-10-CM

## 2015-06-11 DIAGNOSIS — Z6830 Body mass index (BMI) 30.0-30.9, adult: Secondary | ICD-10-CM | POA: Diagnosis not present

## 2015-06-11 DIAGNOSIS — E785 Hyperlipidemia, unspecified: Secondary | ICD-10-CM | POA: Diagnosis present

## 2015-06-11 DIAGNOSIS — Z7901 Long term (current) use of anticoagulants: Secondary | ICD-10-CM | POA: Diagnosis not present

## 2015-06-11 DIAGNOSIS — N179 Acute kidney failure, unspecified: Secondary | ICD-10-CM | POA: Diagnosis present

## 2015-06-11 DIAGNOSIS — I82401 Acute embolism and thrombosis of unspecified deep veins of right lower extremity: Secondary | ICD-10-CM | POA: Diagnosis present

## 2015-06-11 DIAGNOSIS — F0390 Unspecified dementia without behavioral disturbance: Secondary | ICD-10-CM | POA: Diagnosis not present

## 2015-06-11 DIAGNOSIS — I129 Hypertensive chronic kidney disease with stage 1 through stage 4 chronic kidney disease, or unspecified chronic kidney disease: Secondary | ICD-10-CM | POA: Diagnosis present

## 2015-06-11 DIAGNOSIS — R600 Localized edema: Secondary | ICD-10-CM | POA: Diagnosis present

## 2015-06-11 DIAGNOSIS — E669 Obesity, unspecified: Secondary | ICD-10-CM | POA: Diagnosis present

## 2015-06-11 DIAGNOSIS — Z79899 Other long term (current) drug therapy: Secondary | ICD-10-CM | POA: Diagnosis not present

## 2015-06-11 LAB — BASIC METABOLIC PANEL
ANION GAP: 6 (ref 5–15)
ANION GAP: 8 (ref 5–15)
BUN: 21 mg/dL — ABNORMAL HIGH (ref 6–20)
BUN: 23 mg/dL — ABNORMAL HIGH (ref 6–20)
CALCIUM: 9 mg/dL (ref 8.9–10.3)
CALCIUM: 9 mg/dL (ref 8.9–10.3)
CHLORIDE: 102 mmol/L (ref 101–111)
CO2: 24 mmol/L (ref 22–32)
CO2: 28 mmol/L (ref 22–32)
CREATININE: 1.33 mg/dL — AB (ref 0.61–1.24)
CREATININE: 1.44 mg/dL — AB (ref 0.61–1.24)
Chloride: 104 mmol/L (ref 101–111)
GFR calc non Af Amer: 51 mL/min — ABNORMAL LOW (ref >60)
GFR, EST AFRICAN AMERICAN: 59 mL/min — AB (ref >60)
GFR, EST NON AFRICAN AMERICAN: 56 mL/min — AB (ref >60)
Glucose, Bld: 76 mg/dL (ref 65–99)
Glucose, Bld: 82 mg/dL (ref 65–99)
Potassium: 4.2 mmol/L (ref 3.5–5.1)
Potassium: 4.3 mmol/L (ref 3.5–5.1)
SODIUM: 136 mmol/L (ref 135–145)
SODIUM: 136 mmol/L (ref 135–145)

## 2015-06-11 LAB — APTT
APTT: 55 s — AB (ref 24–37)
APTT: 101 s — AB (ref 24–37)

## 2015-06-11 LAB — CBC
HCT: 31.1 % — ABNORMAL LOW (ref 39.0–52.0)
Hemoglobin: 9.8 g/dL — ABNORMAL LOW (ref 13.0–17.0)
MCH: 28.5 pg (ref 26.0–34.0)
MCHC: 31.5 g/dL (ref 30.0–36.0)
MCV: 90.4 fL (ref 78.0–100.0)
Platelets: 234 10*3/uL (ref 150–400)
RBC: 3.44 MIL/uL — ABNORMAL LOW (ref 4.22–5.81)
RDW: 15.3 % (ref 11.5–15.5)
WBC: 5.4 10*3/uL (ref 4.0–10.5)

## 2015-06-11 LAB — HEPARIN LEVEL (UNFRACTIONATED)
HEPARIN UNFRACTIONATED: 1.02 [IU]/mL — AB (ref 0.30–0.70)
HEPARIN UNFRACTIONATED: 0.94 [IU]/mL — AB (ref 0.30–0.70)

## 2015-06-11 LAB — PROTIME-INR
INR: 1.7 — AB (ref 0.00–1.49)
PROTHROMBIN TIME: 20 s — AB (ref 11.6–15.2)

## 2015-06-11 MED ORDER — LORAZEPAM 0.5 MG PO TABS: 0.5000 mg | ORAL_TABLET | ORAL | Status: DC | PRN

## 2015-06-11 MED ORDER — DONEPEZIL HCL 10 MG PO TABS
10.0000 mg | ORAL_TABLET | Freq: Every day | ORAL | Status: DC
  Administered 2015-06-11 – 2015-06-12 (×2): 10 mg via ORAL
  Filled 2015-06-11 (×2): qty 1

## 2015-06-11 MED ORDER — HYDROCODONE-ACETAMINOPHEN 5-325 MG PO TABS: 1.0000 | ORAL_TABLET | ORAL | Status: DC | PRN

## 2015-06-11 MED ORDER — SODIUM CHLORIDE 0.9 % IV SOLN: 250.0000 mL | INTRAVENOUS | Status: DC | PRN

## 2015-06-11 MED ORDER — FUROSEMIDE 40 MG PO TABS
40.0000 mg | ORAL_TABLET | Freq: Every day | ORAL | Status: DC
  Administered 2015-06-11 – 2015-06-13 (×3): 40 mg via ORAL
  Filled 2015-06-11 (×3): qty 1

## 2015-06-11 MED ORDER — SODIUM CHLORIDE 0.9 % IV BOLUS (SEPSIS)
500.0000 mL | Freq: Once | INTRAVENOUS | Status: AC
  Administered 2015-06-11: 500 mL via INTRAVENOUS

## 2015-06-11 MED ORDER — LISINOPRIL 10 MG PO TABS
10.0000 mg | ORAL_TABLET | Freq: Every day | ORAL | Status: DC
  Administered 2015-06-11 – 2015-06-13 (×3): 10 mg via ORAL
  Filled 2015-06-11 (×3): qty 1

## 2015-06-11 MED ORDER — SORBITOL 70 % SOLN: 30.0000 mL | Freq: Every day | Status: DC | PRN

## 2015-06-11 MED ORDER — SODIUM CHLORIDE 0.9 % IJ SOLN: 3.0000 mL | INTRAMUSCULAR | Status: DC | PRN

## 2015-06-11 MED ORDER — GABAPENTIN 100 MG PO CAPS
100.0000 mg | ORAL_CAPSULE | Freq: Every day | ORAL | Status: DC
  Administered 2015-06-11 – 2015-06-12 (×2): 100 mg via ORAL
  Filled 2015-06-11 (×2): qty 1

## 2015-06-11 MED ORDER — MEMANTINE HCL 10 MG PO TABS
10.0000 mg | ORAL_TABLET | Freq: Two times a day (BID) | ORAL | Status: DC
  Administered 2015-06-11 – 2015-06-13 (×5): 10 mg via ORAL
  Filled 2015-06-11 (×8): qty 1

## 2015-06-11 MED ORDER — RISPERIDONE 0.5 MG PO TABS
2.0000 mg | ORAL_TABLET | Freq: Every day | ORAL | Status: DC
  Administered 2015-06-11 – 2015-06-13 (×3): 2 mg via ORAL
  Filled 2015-06-11 (×3): qty 4

## 2015-06-11 MED ORDER — RISPERIDONE 0.5 MG PO TABS
1.0000 mg | ORAL_TABLET | Freq: Every day | ORAL | Status: DC
  Administered 2015-06-11 – 2015-06-12 (×2): 1 mg via ORAL
  Filled 2015-06-11 (×2): qty 2

## 2015-06-11 MED ORDER — POTASSIUM CHLORIDE CRYS ER 20 MEQ PO TBCR
10.0000 meq | EXTENDED_RELEASE_TABLET | Freq: Every day | ORAL | Status: DC
  Administered 2015-06-11 – 2015-06-12 (×2): 10 meq via ORAL
  Filled 2015-06-11 (×4): qty 1

## 2015-06-11 MED ORDER — HEPARIN (PORCINE) IN NACL 100-0.45 UNIT/ML-% IJ SOLN
1350.0000 [IU]/h | INTRAMUSCULAR | Status: DC
  Administered 2015-06-11: 1200 [IU]/h via INTRAVENOUS
  Administered 2015-06-12: 1350 [IU]/h via INTRAVENOUS
  Filled 2015-06-11 (×4): qty 250

## 2015-06-11 MED ORDER — FERROUS SULFATE 325 (65 FE) MG PO TABS
325.0000 mg | ORAL_TABLET | Freq: Every day | ORAL | Status: DC
  Administered 2015-06-11 – 2015-06-13 (×3): 325 mg via ORAL
  Filled 2015-06-11 (×3): qty 1

## 2015-06-11 MED ORDER — SODIUM CHLORIDE 0.9 % IJ SOLN
3.0000 mL | Freq: Two times a day (BID) | INTRAMUSCULAR | Status: DC
  Administered 2015-06-11 – 2015-06-13 (×3): 3 mL via INTRAVENOUS

## 2015-06-11 MED ORDER — ARIPIPRAZOLE 5 MG PO TABS
30.0000 mg | ORAL_TABLET | Freq: Every day | ORAL | Status: DC
  Administered 2015-06-11 – 2015-06-13 (×3): 30 mg via ORAL
  Filled 2015-06-11 (×3): qty 6

## 2015-06-11 MED ORDER — ACETAMINOPHEN 650 MG RE SUPP: 650.0000 mg | Freq: Four times a day (QID) | RECTAL | Status: DC | PRN

## 2015-06-11 MED ORDER — ACETAMINOPHEN 325 MG PO TABS: 650.0000 mg | ORAL_TABLET | Freq: Four times a day (QID) | ORAL | Status: DC | PRN

## 2015-06-11 MED ORDER — VITAMIN D3 25 MCG (1000 UNIT) PO TABS
1000.0000 [IU] | ORAL_TABLET | Freq: Two times a day (BID) | ORAL | Status: DC
Start: 2015-06-11 — End: 2015-06-13
  Administered 2015-06-11 – 2015-06-13 (×5): 1000 [IU] via ORAL
  Filled 2015-06-11 (×11): qty 1

## 2015-06-11 NOTE — Clinical Social Work Note (Signed)
Clinical Social Work Assessment  Patient Details  Name: [name] MRN: [id number] Date of Birth: [birth year]  Date of referral:  06/11/15               Reason for consult:  Facility Placement, Discharge Planning                Permission sought to share information with:  Facility Medical sales representative, Family Supports Permission granted to share information::  Yes, Verbal Permission Granted  Name::     Ernestine  Agency::  [facility]  Relationship::  Patient's sister and Armed forces technical officer Information:  number provided [phone]) currently a non-working number  Housing/Transportation Living arrangements for the past 2 months:  Assisted Living Facility ([facility]) Source of Information:  Facility Patient Interpreter Needed:  None Criminal Activity/Legal Involvement Pertinent to Current Situation/Hospitalization:  No - Comment as needed Significant Relationships:  Siblings, Other(Comment) Engineer, building services) Lives with:  Facility Resident ([facility]) Do you feel safe going back to the place where you live?  Yes Need for family participation in patient care:  Yes (Comment) (Patient with MR, patient's sister is HCPOA.)  Care giving concerns:  Patient with MR, unable to participate in assessment.   Social Worker assessment / plan:  CSW following patient regarding discharge disposition planning. Per chart review, patient admitted from [facility]. CSW contacted facility regarding patient's return. Per ALF admissions liaison, patient has been a resident at [facility] for three years. CSW attempted to contact patient's sister / HCPOA, however, patient's sister phone is currently a non-working number. CSW to continue to follow and assist with discharge planning needs.  Employment status:  Disabled (Comment on whether or not currently receiving Disability) Insurance information:  Medicare PT Recommendations:  Not assessed at this time Information / Referral to community resources:  Other  (Comment Required) (Patient to return to ALF.)  Patient/Family's Response to care:  Pending CSW contact with patient's sister / HCPOA.  Patient/Family's Understanding of and Emotional Response to Diagnosis, Current Treatment, and Prognosis:  Pending CSW contact with patient's sister / HCPOA.  Emotional Assessment Appearance:  Other (Comment Required (CSW spoke with [facility] as patient with MR and patient's sister with non-working number.) Attitude/Demeanor/Rapport:  Other (CSW spoke with [facility] as patient with MR and patient's sister with non-working number.) Affect (typically observed):  Other (CSW spoke with [facility] as patient with MR and patient's sister with non-working number.) Orientation:  Oriented to Self Alcohol / Substance use:  Not Applicable Psych involvement (Current and /or in the community):  No (Comment) (Not appropriate on this admission.)  Discharge Needs  Concerns to be addressed:  No discharge needs identified Readmission within the last 30 days:  No Current discharge risk:  None Barriers to Discharge:  No Barriers Identified   Rod Mae, LCSW 06/11/2015, 3:48 PM [phone]

## 2015-06-11 NOTE — Progress Notes (Signed)
ANTICOAGULATION CONSULT NOTE  Pharmacy Consult for Heparin (holding/?stopping Xarelto) Indication: DVT  No Known Allergies  Labs:  Recent Labs  06/10/15 2346 06/11/15 0520 06/11/15 1030  HGB 10.3* 9.8*  --   HCT 32.0* 31.1*  --   PLT 208 234  --   APTT  --   --  55*  LABPROT 20.0*  --   --   INR 1.70*  --   --   HEPARINUNFRC  --   --  1.02*  CREATININE 1.44* 1.33*  --     Estimated Creatinine Clearance: 67 mL/min (by C-G formula based on Cr of 1.33).   Medical History: Past Medical History  Diagnosis Date  . Mental retardation   . DVT (deep venous thrombosis)     right leg  . Dyslipidemia   . Obesity   . Hypertension   . Dementia 06/19/2014  . Other and unspecified hyperlipidemia 06/19/2014   Assessment: 64 y/o M from [facility] with increased leg swelling and R-upper foot laceration. Pt was recent diagnosed with DVT in leg and sent home on Xarelto 15 mg BID.  Admitted for ? Worsening DVT and placed on heparin.  Initial heparin level = 1.02, PTT = 55 seconds  Goal of Therapy:  Heparin level 0.3-0.7 units/ml Monitor platelets by anticoagulation protocol: Yes  PTT = 66 to 102 seconds   Plan:  Increase heparin to 1400 units / hr 6 hour PTT and heparin level  Follow up long term plan for anti-coag?  Thank  You Okey Regal, PharmD [phone]  06/11/2015,12:51 PM

## 2015-06-11 NOTE — Progress Notes (Signed)
Patient seen and evaluated earlier this AM. Please refer to [facility] H[facility] for details regarding assessment and plan. Currently awaiting Doppler study results. Pending results we'll plan on consulting hematologist for further recommendations regarding anticoagulation.  Patient seen and evaluated Gen. patient in no acute distress alert and awake Cardiovascular normal S1 and S2 Pulmonary no increased work of breathing, no wheezes  Plan on reassessing next a.m.  Tonishia Steffy, [employer]

## 2015-06-11 NOTE — Progress Notes (Signed)
ANTICOAGULATION CONSULT NOTE - Initial Consult  Pharmacy Consult for Heparin (holding/?stopping Xarelto) Indication: DVT  No Known Allergies  Patient Measurements: Height:  (177.8 cm) Weight: 212 lb (96.163 kg) IBW/kg (Calculated) : 73  Vital Signs: Temp: 97.5 F (36.4 C) (08/31 0202) Temp Source: Oral (08/31 0202) BP: 129/76 mmHg (08/31 0202) Pulse Rate: 49 (08/31 0202)  Labs:  Recent Labs  06/10/15 2346  HGB 10.3*  HCT 32.0*  PLT 208  LABPROT 20.0*  INR 1.70*  CREATININE 1.44*    Estimated Creatinine Clearance: 61.9 mL/min (by C-G formula based on Cr of 1.44).   Medical History: Past Medical History  Diagnosis Date  . Mental retardation   . DVT (deep venous thrombosis)     right leg  . Dyslipidemia   . Obesity   . Hypertension   . Dementia 06/19/2014  . Other and unspecified hyperlipidemia 06/19/2014   Assessment: 64 [facility] with increased leg swelling and R-upper foot laceration. Pt was recent diagnosed with DVT in leg and sent home on Xarelto 15 mg BID. Admitting MD thinks leg is getting worse and wants to start heparin now, last dose of Xarelto around 2000 on 8/30. Hgb 10.3. Mild renal dysfunction.   Goal of Therapy:  Heparin level 0.3-0.7 units/ml Monitor platelets by anticoagulation protocol: Yes   Plan:  -Start heparin drip at 1200 units/hr -Baseline labs were ordered, MD wants to start now, will go ahead and use aPTT to dose -1000 aPTT/HL -Daily CBC/aPTT/HL -Monitor for bleeding  Abran Duke 06/11/2015,2:33 AM

## 2015-06-11 NOTE — Progress Notes (Signed)
Preliminary results by tech - Right Lower Ext. Venous Completed. Chronic appearing deep vein thrombosis involving the  right femoral vein, popliteal vein and posterior tibial veins. The peroneal veins were not clearly visualized. These results are unchanged from prior study done on 05/31/15. Results given to patient's nurse. Marilynne Halsted, BS, RDMS, RVT

## 2015-06-11 NOTE — H&P (Signed)
[facility] History and Physical  [name] KDT:[id number] DOB: June 29, 1951 DOA: 06/10/2015  Referring physician: Ottie Glazier PA PCP: Cleda Mccreedy, MD   Chief Complaint: Increased RLE swelling, recently dx'd with DVT  HPI: [name] is a 64 y.o. male with hist of mental retardation, HL, HTN.  He was here on Aug 20th with RLE swelling.  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  Study from 06/2011 showed chronic thrombosis in the same areas as well.  He had been on coumadin, stopped in 2012.  Decision was made w help of hematology to xarelto 15 mg bid. DC'd to living facility.    He returns today from [facility] with increasing swelling in the RLE. Patient has MR does not provide detailed answers to questions. Denies any pain, says he is "fine".  He says he doesn't walk and uses the wheelchair where he lives.   Where does patient live - [place] Can patient participate in ADLs? no  Past Medical History  Past Medical History  Diagnosis Date  . Mental retardation   . DVT (deep venous thrombosis)     right leg  . Dyslipidemia   . Obesity   . Hypertension   . Dementia 06/19/2014  . Other and unspecified hyperlipidemia 06/19/2014   Past Surgical History History reviewed. No pertinent past surgical history. Family History  Family History  Problem Relation Age of Onset  . Hypertension      Social History  reports that he has never smoked. He has never used smokeless tobacco. He reports that he does not drink alcohol or use illicit drugs. Allergies No Known Allergies Home medications Prior to Admission medications   Medication Sig Start Date End Date Taking? Authorizing Provider  ARIPiprazole (ABILIFY) 30 MG tablet Take 30 mg by mouth daily.   Yes Historical Provider, MD  Cholecalciferol (VITAMIN D3) 1000 UNITS CAPS Take 1 capsule by mouth 2 (two) times daily.     Yes Historical Provider, MD  donepezil (ARICEPT) 10 MG tablet Take 10 mg by mouth at  bedtime.    Yes Historical Provider, MD  ferrous sulfate 325 (65 FE) MG tablet Take 325 mg by mouth daily with breakfast.     Yes Historical Provider, MD  furosemide (LASIX) 40 MG tablet Take 40 mg by mouth daily.   Yes Historical Provider, MD  gabapentin (NEURONTIN) 100 MG capsule Take 100 mg by mouth at bedtime.     Yes Historical Provider, MD  lisinopril (PRINIVIL,ZESTRIL) 10 MG tablet Take 10 mg by mouth daily.     Yes Historical Provider, MD  LORazepam (ATIVAN) 0.5 MG tablet Take 1 tablet (0.5 mg total) by mouth every 4 (four) hours as needed (for agitation). 06/21/14  Yes Eugenie Filler, MD  memantine (NAMENDA) 10 MG tablet Take 10 mg by mouth 2 (two) times daily.     Yes Historical Provider, MD  Omega-3 Fatty Acids (FISH OIL) 1200 MG CAPS Take 1 capsule by mouth daily.     Yes Historical Provider, MD  potassium chloride (KLOR-CON) 10 MEQ CR tablet Take 10 mEq by mouth daily.     Yes Historical Provider, MD  risperiDONE (RISPERDAL) 1 MG tablet Take 1 mg by mouth at bedtime.   Yes Historical Provider, MD  risperiDONE (RISPERDAL) 2 MG tablet Take 2 mg by mouth daily with breakfast.   Yes Historical Provider, MD  Rivaroxaban (XARELTO) 15 MG TABS tablet Take 15 mg by mouth 2 (two) times daily with a  meal.   Yes Historical Provider, MD  enoxaparin (LOVENOX) 100 MG/ML injection Inject 1.5 mLs (150 mg total) into the skin daily. Patient not taking: Reported on 06/10/2015 05/31/15   Wandra Arthurs, MD   Liver Function Tests No results for input(s): AST, ALT, ALKPHOS, BILITOT, PROT, ALBUMIN in the last 168 hours. No results for input(s): LIPASE, AMYLASE in the last 168 hours. CBC  Recent Labs Lab 06/10/15 2346  WBC 6.4  NEUTROABS 3.7  HGB 10.3*  HCT 32.0*  MCV 91.7  PLT 943   Basic Metabolic Panel  Recent Labs Lab 06/10/15 2346  NA 136  K 4.2  CL 102  CO2 28  GLUCOSE 82  BUN 23*  CREATININE 1.44*  CALCIUM 9.0     Filed Vitals:   06/10/15 2315 06/10/15 2330 06/11/15 0100  06/11/15 0114  BP: 113/74 116/83 114/83 114/83  Pulse: 103 83 59 62  Temp:      TempSrc:      Resp:    16  Height:      Weight:      SpO2: 98% 98% 100% 100%   Exam: Recurring tic of face/ head, responds to some simple questions with one-word answers No rash, cyanosis or gangrene Sclera anicteric, throat clear Eyelids w exudate Neck no jvd Chest clear bilat RRR no MRG ABd obese, soft ntnd no mass or ascites GU normal  RLE marked edema , mild erythema lower leg, tender to touch LLE 1+ edema, no erythema Neuro grips bilat 4-/5, LE strength minimal 0/5, knows his name and "hospital", not sure year or pres     Na 136  K 4.2  Cl 102  CO2 28  BUn23  Cr 1.44  Ca 9.0, eGFR 51 WBC 6k  Hb 10.3/ Hct 32%  plt 208  INR 1.70  Glu 82  Doppler LE on 05/31/15 > Summary:  Findings consistent with chronic deep vein thrombosis involving  the right femoral vein and right popliteal vein. This is most likely unchanged from previous study 06/2011  Foot xray/ tib-fib xray > no fractures   Assessment: 1. RLE DVT - leg is worse despite Xarelto since 8/20.  Plan is for IV heparin for now, repeat Doppler in am.  2. Mental retardation 3. CKD - creat at baseline ~ 1.5 4. Psych/ dementia - cont meds 5. HTN - cont lisinopril, lasix/ KCl 6. EOL - consider pall care consult for [place] - as above   DVT Prophylaxis IV heparin  Code Status: full  Family Communication: none  Disposition Plan: back to facility when stable    [place] D [facility] Pager [phone]  If 7PM-7AM, please contact night-coverage [email or web address] Password [email or web address] 06/11/2015, 1:57 AM

## 2015-06-11 NOTE — Progress Notes (Signed)
ANTICOAGULATION CONSULT NOTE  Pharmacy Consult for Heparin (holding/?stopping Xarelto) Indication: DVT  No Known Allergies  Labs:  Recent Labs  06/10/15 2346 06/11/15 0520 06/11/15 1030 06/11/15 1847  HGB 10.3* 9.8*  --   --   HCT 32.0* 31.1*  --   --   PLT 208 234  --   --   APTT  --   --  55* 101*  LABPROT 20.0*  --   --   --   INR 1.70*  --   --   --   HEPARINUNFRC  --   --  1.02* 0.94*  CREATININE 1.44* 1.33*  --   --     Estimated Creatinine Clearance: 67 mL/min (by C-G formula based on Cr of 1.33).   Medical History: Past Medical History  Diagnosis Date  . Mental retardation   . DVT (deep venous thrombosis)     right leg  . Dyslipidemia   . Obesity   . Hypertension   . Dementia 06/19/2014  . Other and unspecified hyperlipidemia 06/19/2014   Assessment: 64 [facility] with increased leg swelling and R-upper foot laceration. Pt has recent diagnosed with DVT in leg and sent home on Xarelto 15 mg BID.  Admitted for ? worsening DVT and placed on heparin.  Heparin level = 0.94, trending down as expected as Xarelto clears from system. PTT = 101 seconds on 1400 units/hr  Goal of Therapy:  Heparin level 0.3-0.7 units/ml Monitor platelets by anticoagulation protocol: Yes  PTT = 66 to 102 seconds   Plan:  Decrease heparin to 1350 units / hr since at upper end of goal range Next labs with AM lab draw  Follow up long term plan for anti-coag?  [employer], Pharm.D., BCPS Clinical Pharmacist Pager [phone] 06/11/2015 8:33 PM

## 2015-06-12 DIAGNOSIS — I10 Essential (primary) hypertension: Secondary | ICD-10-CM

## 2015-06-12 DIAGNOSIS — F0390 Unspecified dementia without behavioral disturbance: Secondary | ICD-10-CM

## 2015-06-12 LAB — CBC
HCT: 32 % — ABNORMAL LOW (ref 39.0–52.0)
HEMOGLOBIN: 10.6 g/dL — AB (ref 13.0–17.0)
MCH: 30.1 pg (ref 26.0–34.0)
MCHC: 33.1 g/dL (ref 30.0–36.0)
MCV: 90.9 fL (ref 78.0–100.0)
PLATELETS: 228 10*3/uL (ref 150–400)
RBC: 3.52 MIL/uL — ABNORMAL LOW (ref 4.22–5.81)
RDW: 15.1 % (ref 11.5–15.5)
WBC: 7 10*3/uL (ref 4.0–10.5)

## 2015-06-12 LAB — APTT: aPTT: 100 seconds — ABNORMAL HIGH (ref 24–37)

## 2015-06-12 LAB — HEPARIN LEVEL (UNFRACTIONATED): HEPARIN UNFRACTIONATED: 0.75 [IU]/mL — AB (ref 0.30–0.70)

## 2015-06-12 MED ORDER — RIVAROXABAN 20 MG PO TABS: 20.0000 mg | ORAL_TABLET | Freq: Every day | ORAL | Status: DC

## 2015-06-12 MED ORDER — RIVAROXABAN 15 MG PO TABS
15.0000 mg | ORAL_TABLET | Freq: Two times a day (BID) | ORAL | Status: DC
  Administered 2015-06-12 – 2015-06-13 (×2): 15 mg via ORAL
  Filled 2015-06-12 (×4): qty 1

## 2015-06-12 NOTE — Progress Notes (Signed)
ANTICOAGULATION CONSULT NOTE  Pharmacy Consult for Heparin back to Xarelto Indication: DVT  No Known Allergies  Labs:  Recent Labs  06/10/15 2346 06/11/15 0520 06/11/15 1030 06/11/15 1847 06/12/15 0549  HGB 10.3* 9.8*  --   --  10.6*  HCT 32.0* 31.1*  --   --  32.0*  PLT 208 234  --   --  228  APTT  --   --  55* 101* 100*  LABPROT 20.0*  --   --   --   --   INR 1.70*  --   --   --   --   HEPARINUNFRC  --   --  1.02* 0.94* 0.75*  CREATININE 1.44* 1.33*  --   --   --     Estimated Creatinine Clearance: 67 mL/min (by C-G formula based on Cr of 1.33).   Medical History: Past Medical History  Diagnosis Date  . Mental retardation   . DVT (deep venous thrombosis)     right leg  . Dyslipidemia   . Obesity   . Hypertension   . Dementia 06/19/2014  . Other and unspecified hyperlipidemia 06/19/2014   Assessment: 64 [facility] increased leg swelling and R-upper foot laceration. Pt was recent diagnosed with DVT in leg and sent home on Xarelto 15 mg BID.  Admitted for ? Worsening DVT and placed on heparin.  Now transitioning back to Xarelto -- 15 mg po BID dose for 10 more days then to 20 mg daily  Goal of Therapy:  Appropriate Xarelto dosing   Plan:  Xarelto 15 mg po BID x 10 more days, then to 20 mg po daily  Thank  You Okey Regal, PharmD [phone]  06/12/2015,2:00 PM

## 2015-06-12 NOTE — Progress Notes (Signed)
[facility] PROGRESS NOTE  [name] XLK:[id number] DOB: [birth year] DOA: 06/10/2015 PCP: Colon Branch, MD  Assessment/Plan: Principal Problem:   DVT (deep venous thrombosis) - doppler unchanged. Swelling is expected with chronic dvt. Discussed with oncology who recommended continuing xarelto. - No increased reports of pain from patient.  Active Problems:   Hypertension -on lisinopril and well controlled.    Leg edema - 2ary to clot burden. Unchanged.     Dementia - Continue Aricept    CKD (chronic kidney disease) stage 3, GFR 30-59 ml/min   Mental retardation   Code Status: full Family Communication: none at bedside  Disposition Plan: transition to xarelto from heparin and d/c   Consultants:  None  Procedures:  None  Antibiotics:  None  HPI/Subjective: Pt has no new complaints  Objective: Filed Vitals:   06/12/15 1038  BP: 113/77  Pulse: 87  Temp: 98.1 F (36.7 C)  Resp: 16    Intake/Output Summary (Last 24 hours) at 06/12/15 1330 Last data filed at 06/11/15 1700  Gross per 24 hour  Intake    240 ml  Output      0 ml  Net    240 ml   Filed Weights   06/10/15 2208  Weight: 96.163 kg (212 lb)    Exam:   General:  Pt in nad, alert and awake  Cardiovascular: rrr, no mrg  Respiratory: cta bl, no wheezes  Abdomen: soft, NT, ND  Musculoskeletal: equal tone bl   Data Reviewed: Basic Metabolic Panel:  Recent Labs Lab 06/10/15 2346 06/11/15 0520  NA 136 136  K 4.2 4.3  CL 102 104  CO2 28 24  GLUCOSE 82 76  BUN 23* 21*  CREATININE 1.44* 1.33*  CALCIUM 9.0 9.0   Liver Function Tests: No results for input(s): AST, ALT, ALKPHOS, BILITOT, PROT, ALBUMIN in the last 168 hours. No results for input(s): LIPASE, AMYLASE in the last 168 hours. No results for input(s): AMMONIA in the last 168 hours. CBC:  Recent Labs Lab 06/10/15 2346 06/11/15 0520 06/12/15 0549  WBC 6.4 5.4 7.0  NEUTROABS 3.7  --   --   HGB 10.3* 9.8*  10.6*  HCT 32.0* 31.1* 32.0*  MCV 91.7 90.4 90.9  PLT 208 234 228   Cardiac Enzymes: No results for input(s): CKTOTAL, CKMB, CKMBINDEX, TROPONINI in the last 168 hours. BNP (last 3 results) No results for input(s): BNP in the last 8760 hours.  ProBNP (last 3 results) No results for input(s): PROBNP in the last 8760 hours.  CBG: No results for input(s): GLUCAP in the last 168 hours.  No results found for this or any previous visit (from the past 240 hour(s)).   Studies: No results found.  Scheduled Meds: . ARIPiprazole  30 mg Oral Daily  . cholecalciferol  1,000 Units Oral BID  . donepezil  10 mg Oral QHS  . ferrous sulfate  325 mg Oral Q breakfast  . furosemide  40 mg Oral Daily  . gabapentin  100 mg Oral QHS  . lisinopril  10 mg Oral Daily  . memantine  10 mg Oral BID  . potassium chloride  10 mEq Oral Daily  . risperiDONE  1 mg Oral QHS  . risperiDONE  2 mg Oral Q breakfast  . sodium chloride  3 mL Intravenous Q12H   Continuous Infusions: . heparin 1,350 Units/hr (06/12/15 0004)    Time spent: > 35 minutes    Penny Pia  [facility] Pager [phone]. If 7PM-7AM, please  contact night-coverage at [email or web address], password [facility] 06/12/2015, 1:30 PM  LOS: 1 day

## 2015-06-12 NOTE — Clinical Social Work Note (Signed)
CSW spoke with patient's cousin, [name] [phone]), regarding patient's discharge disposition. Per patient's cousin, she is Guardian for patient and is agreeable to patient returning to [facility] at time of discharge. CSW to continue to follow and assist with discharge planning needs.  Marcelline Deist, [place] - [phone] Clinical Social Work Department Orthopedics [phone]) and Surgical [phone])

## 2015-06-12 NOTE — Progress Notes (Signed)
Utilization review completed. Kattia Selley, RN, BSN. 

## 2015-06-13 MED ORDER — RIVAROXABAN 20 MG PO TABS: 20.0000 mg | ORAL_TABLET | Freq: Every day | ORAL | Status: DC

## 2015-06-13 MED ORDER — LORAZEPAM 0.5 MG PO TABS: 0.5000 mg | ORAL_TABLET | ORAL | Status: DC | PRN

## 2015-06-13 NOTE — Discharge Summary (Addendum)
Physician Discharge Summary  [name] RUE:[id number] DOB: 09/10/1951 DOA: 06/10/2015  PCP: Colon Branch, MD  Admit date: 06/10/2015 Discharge date: 06/13/2015  Time spent: > 35 minutes  Recommendations for Outpatient Follow-up:  1.  Please have pharmacist confirm dates of one to switch from 15 mg of xarelto to the 20 mg dose. These should not be taken at the same time 2. Please consider setting up a follow-up appointment with hematologist for further monitoring of DVT 3. Monitor serum creatinine  Discharge Diagnoses:  Principal Problem:   DVT (deep venous thrombosis) Active Problems:   Hypertension   Leg edema   Dementia   CKD (chronic kidney disease) stage 3, GFR 30-59 ml/min   Mental retardation   DVT of leg (deep venous thrombosis)   Discharge Condition: Stable  Diet recommendation: addendum: Mechanical soft  Filed Weights   06/10/15 2208  Weight: 96.163 kg (212 lb)    History of present illness:  Patient is a 64 year old male with history of mental retardation, hypertension, and dementia with recent diagnosis of DVT who presented with increased right lower extremity swelling  Hospital Course:  Right lower extremity swelling - Patient denies any pain or any changes. Doppler studies report chronic DVT with similar appearance to prior study. As such will discharge back on his xarelto regimen. - Would recommend routine follow-up with either a primary care physician or the hematologist. We'll defer to physician at skilled nursing facility  Otherwise continue home medication regimen  Procedures:  None  Consultations:  Consulted with hematologist over the phone  Discharge Exam: Filed Vitals:   06/13/15 0423  BP: 120/79  Pulse: 90  Temp: 98.8 F (37.1 C)  Resp: 16    General: Awake and alert, In no acute distress Cardiovascular: no cyanosis Respiratory: no increased wob, no wheezes Musculoskeletal: Patient did not allow me to examine his legs, per his  preference  Discharge Instructions   Discharge Instructions    Call MD for:  extreme fatigue    Complete by:  As directed      Call MD for:  redness, tenderness, or signs of infection (pain, swelling, redness, odor or green/yellow discharge around incision site)    Complete by:  As directed      Call MD for:  temperature >100.4    Complete by:  As directed      Diet - low sodium heart healthy    Complete by:  As directed      Discharge instructions    Complete by:  As directed   Xarelto 15 mg po BID x 9 more days, then to 20 mg po daily (please refer to start date on 20 mg prescription and have pharmacist confirm dates prior to administering different xarelto dose)     Increase activity slowly    Complete by:  As directed           Current Discharge Medication List    START taking these medications   Details  !! rivaroxaban (XARELTO) 20 MG TABS tablet Take 1 tablet (20 mg total) by mouth daily with supper. Qty: 30 tablet (Starting 06/22/15)     !! - Potential duplicate medications found. Please discuss with provider.    CONTINUE these medications which have CHANGED   Details  LORazepam (ATIVAN) 0.5 MG tablet Take 1 tablet (0.5 mg total) by mouth every 4 (four) hours as needed (for agitation). Qty: 1 tablet, Refills: 0      CONTINUE these medications which have NOT CHANGED  Details  ARIPiprazole (ABILIFY) 30 MG tablet Take 30 mg by mouth daily.    Cholecalciferol (VITAMIN D3) 1000 UNITS CAPS Take 1 capsule by mouth 2 (two) times daily.      donepezil (ARICEPT) 10 MG tablet Take 10 mg by mouth at bedtime.     ferrous sulfate 325 (65 FE) MG tablet Take 325 mg by mouth daily with breakfast.      furosemide (LASIX) 40 MG tablet Take 40 mg by mouth daily.    gabapentin (NEURONTIN) 100 MG capsule Take 100 mg by mouth at bedtime.      lisinopril (PRINIVIL,ZESTRIL) 10 MG tablet Take 10 mg by mouth daily.      memantine (NAMENDA) 10 MG tablet Take 10 mg by mouth 2 (two)  times daily.      Omega-3 Fatty Acids (FISH OIL) 1200 MG CAPS Take 1 capsule by mouth daily.      potassium chloride (KLOR-CON) 10 MEQ CR tablet Take 10 mEq by mouth daily.      !! risperiDONE (RISPERDAL) 1 MG tablet Take 1 mg by mouth at bedtime.    !! risperiDONE (RISPERDAL) 2 MG tablet Take 2 mg by mouth daily with breakfast.    !! Rivaroxaban (XARELTO) 15 MG TABS tablet Take 15 mg by mouth 2 (two) times daily with a meal.     !! - Potential duplicate medications found. Please discuss with provider.    STOP taking these medications     enoxaparin (LOVENOX) 100 MG/ML injection        No Known Allergies    The results of significant diagnostics from this hospitalization (including imaging, microbiology, ancillary and laboratory) are listed below for reference.    Significant Diagnostic Studies: Dg Tibia/fibula Right  05/31/2015   CLINICAL DATA:  Leg swelling, known known injury, initial encounter  EXAM: RIGHT TIBIA AND FIBULA - 2 VIEW  COMPARISON:  None.  FINDINGS: Degenerative changes are noted with some spurring from the patella. No acute fracture or dislocation is noted. Degenerative changes in the tarsal bones are noted as well. No gross soft tissue abnormality is seen.  IMPRESSION: Degenerative change without acute abnormality.   Electronically Signed   By: Alcide Clever M.D.   On: 05/31/2015 15:42   Dg Foot 2 Views Right  05/31/2015   CLINICAL DATA:  64 year old male with a history of leg edema.  EXAM: RIGHT FOOT - 2 VIEW  COMPARISON:  None.  FINDINGS: No acute fracture. Soft tissue swelling at the foot and distal ankle. Skin disruption at the instep of the ankle on the lateral view.  Degenerative changes of the forefoot, hindfoot, and midfoot.  IMPRESSION: Negative for acute bony abnormality.  Soft tissue swelling of the foot and visualized ankle.  Soft tissue defect on the instep of the ankle, amenable to direct inspection.  Signed,  Yvone Neu. Loreta Ave, DO  [facility]  [facility]   Electronically Signed   By: Gilmer Mor D.O.   On: 05/31/2015 15:43    Microbiology: No results found for this or any previous visit (from the past 240 hour(s)).   Labs: Basic Metabolic Panel:  Recent Labs Lab 06/10/15 2346 06/11/15 0520  NA 136 136  K 4.2 4.3  CL 102 104  CO2 28 24  GLUCOSE 82 76  BUN 23* 21*  CREATININE 1.44* 1.33*  CALCIUM 9.0 9.0   Liver Function Tests: No results for input(s): AST, ALT, ALKPHOS, BILITOT, PROT, ALBUMIN in the last 168  hours. No results for input(s): LIPASE, AMYLASE in the last 168 hours. No results for input(s): AMMONIA in the last 168 hours. CBC:  Recent Labs Lab 06/10/15 2346 06/11/15 0520 06/12/15 0549  WBC 6.4 5.4 7.0  NEUTROABS 3.7  --   --   HGB 10.3* 9.8* 10.6*  HCT 32.0* 31.1* 32.0*  MCV 91.7 90.4 90.9  PLT 208 234 228   Cardiac Enzymes: No results for input(s): CKTOTAL, CKMB, CKMBINDEX, TROPONINI in the last 168 hours. BNP: BNP (last 3 results) No results for input(s): BNP in the last 8760 hours.  ProBNP (last 3 results) No results for input(s): PROBNP in the last 8760 hours.  CBG: No results for input(s): GLUCAP in the last 168 hours.     Signed:  Penny Pia  [facility] 06/13/2015, 10:35 AM

## 2015-06-13 NOTE — Clinical Social Work Note (Addendum)
Patient discharging back to [facility] today, transported by ambulance ([facility]).  Patient's cousin [place] contacted and informed of discharge.   Genelle Bal, MSW, LCSW Licensed Clinical Social Worker Clinical Social Work Department [employer] [phone]

## 2015-06-13 NOTE — Progress Notes (Signed)
[facility] with 3 attendants transporting patient and personal belongings via stretcher to [facility].

## 2015-06-13 NOTE — Progress Notes (Signed)
This RN called [facility] - gave report r/t hospitalization for DVT to RLE - to continue taking Xarelto - staff person states he has been taking prior to hospitalization.  Denies questions r/t patient's care.

## 2015-10-25 ENCOUNTER — Emergency Department (HOSPITAL_COMMUNITY): Payer: Medicare Other

## 2015-10-25 ENCOUNTER — Inpatient Hospital Stay (HOSPITAL_COMMUNITY)
Admission: EM | Admit: 2015-10-25 | Discharge: 2015-10-28 | DRG: 683 | Disposition: A | Discharge status: Nursing Home (Includes ICF) | Payer: Medicare Other | Attending: Internal Medicine | Admitting: Internal Medicine

## 2015-10-25 ENCOUNTER — Encounter (HOSPITAL_COMMUNITY): Payer: Self-pay | Admitting: Emergency Medicine

## 2015-10-25 DIAGNOSIS — N183 Chronic kidney disease, stage 3 unspecified: Secondary | ICD-10-CM | POA: Diagnosis present

## 2015-10-25 DIAGNOSIS — D649 Anemia, unspecified: Secondary | ICD-10-CM | POA: Diagnosis present

## 2015-10-25 DIAGNOSIS — Z993 Dependence on wheelchair: Secondary | ICD-10-CM

## 2015-10-25 DIAGNOSIS — I10 Essential (primary) hypertension: Secondary | ICD-10-CM | POA: Diagnosis present

## 2015-10-25 DIAGNOSIS — E860 Dehydration: Secondary | ICD-10-CM | POA: Diagnosis not present

## 2015-10-25 DIAGNOSIS — J069 Acute upper respiratory infection, unspecified: Secondary | ICD-10-CM | POA: Diagnosis present

## 2015-10-25 DIAGNOSIS — N179 Acute kidney failure, unspecified: Secondary | ICD-10-CM | POA: Diagnosis not present

## 2015-10-25 DIAGNOSIS — I959 Hypotension, unspecified: Secondary | ICD-10-CM | POA: Diagnosis present

## 2015-10-25 DIAGNOSIS — E875 Hyperkalemia: Secondary | ICD-10-CM | POA: Diagnosis present

## 2015-10-25 DIAGNOSIS — Z7902 Long term (current) use of antithrombotics/antiplatelets: Secondary | ICD-10-CM

## 2015-10-25 DIAGNOSIS — I5032 Chronic diastolic (congestive) heart failure: Secondary | ICD-10-CM | POA: Diagnosis present

## 2015-10-25 DIAGNOSIS — F72 Severe intellectual disabilities: Secondary | ICD-10-CM | POA: Diagnosis present

## 2015-10-25 DIAGNOSIS — I440 Atrioventricular block, first degree: Secondary | ICD-10-CM | POA: Diagnosis present

## 2015-10-25 DIAGNOSIS — I130 Hypertensive heart and chronic kidney disease with heart failure and stage 1 through stage 4 chronic kidney disease, or unspecified chronic kidney disease: Secondary | ICD-10-CM | POA: Diagnosis present

## 2015-10-25 DIAGNOSIS — F0390 Unspecified dementia without behavioral disturbance: Secondary | ICD-10-CM | POA: Diagnosis present

## 2015-10-25 DIAGNOSIS — H109 Unspecified conjunctivitis: Secondary | ICD-10-CM | POA: Diagnosis present

## 2015-10-25 DIAGNOSIS — Z8249 Family history of ischemic heart disease and other diseases of the circulatory system: Secondary | ICD-10-CM

## 2015-10-25 DIAGNOSIS — D508 Other iron deficiency anemias: Secondary | ICD-10-CM | POA: Diagnosis not present

## 2015-10-25 DIAGNOSIS — E785 Hyperlipidemia, unspecified: Secondary | ICD-10-CM | POA: Diagnosis present

## 2015-10-25 DIAGNOSIS — Z6835 Body mass index (BMI) 35.0-35.9, adult: Secondary | ICD-10-CM

## 2015-10-25 DIAGNOSIS — E669 Obesity, unspecified: Secondary | ICD-10-CM | POA: Diagnosis present

## 2015-10-25 DIAGNOSIS — I825Z3 Chronic embolism and thrombosis of unspecified deep veins of distal lower extremity, bilateral: Secondary | ICD-10-CM | POA: Diagnosis present

## 2015-10-25 LAB — DIFFERENTIAL
BASOS ABS: 0 10*3/uL (ref 0.0–0.1)
Basophils Relative: 0 %
EOS PCT: 1 %
Eosinophils Absolute: 0.1 10*3/uL (ref 0.0–0.7)
LYMPHS ABS: 2.5 10*3/uL (ref 0.7–4.0)
LYMPHS PCT: 30 %
Monocytes Absolute: 0.8 10*3/uL (ref 0.1–1.0)
Monocytes Relative: 10 %
NEUTROS PCT: 59 %
Neutro Abs: 5 10*3/uL (ref 1.7–7.7)

## 2015-10-25 LAB — CBC
HCT: 35.7 % — ABNORMAL LOW (ref 39.0–52.0)
HEMOGLOBIN: 12.1 g/dL — AB (ref 13.0–17.0)
MCH: 30 pg (ref 26.0–34.0)
MCHC: 33.9 g/dL (ref 30.0–36.0)
MCV: 88.4 fL (ref 78.0–100.0)
Platelets: 230 10*3/uL (ref 150–400)
RBC: 4.04 MIL/uL — AB (ref 4.22–5.81)
RDW: 16 % — ABNORMAL HIGH (ref 11.5–15.5)
WBC: 8.4 10*3/uL (ref 4.0–10.5)

## 2015-10-25 LAB — BASIC METABOLIC PANEL
Anion gap: 10 (ref 5–15)
BUN: 36 mg/dL — ABNORMAL HIGH (ref 6–20)
CALCIUM: 9.3 mg/dL (ref 8.9–10.3)
CHLORIDE: 104 mmol/L (ref 101–111)
CO2: 25 mmol/L (ref 22–32)
CREATININE: 2.07 mg/dL — AB (ref 0.61–1.24)
GFR calc Af Amer: 38 mL/min — ABNORMAL LOW (ref >60)
GFR calc non Af Amer: 33 mL/min — ABNORMAL LOW (ref >60)
GLUCOSE: 83 mg/dL (ref 65–99)
Potassium: 4.8 mmol/L (ref 3.5–5.1)
Sodium: 139 mmol/L (ref 135–145)

## 2015-10-25 LAB — NA AND K (SODIUM & POTASSIUM), RAND UR
POTASSIUM UR: 56 mmol/L
SODIUM UR: 83 mmol/L

## 2015-10-25 LAB — COMPREHENSIVE METABOLIC PANEL
ALBUMIN: 3.5 g/dL (ref 3.5–5.0)
ALT: 31 U/L (ref 17–63)
AST: 46 U/L — AB (ref 15–41)
Alkaline Phosphatase: 88 U/L (ref 38–126)
Anion gap: 9 (ref 5–15)
BILIRUBIN TOTAL: 0.3 mg/dL (ref 0.3–1.2)
BUN: 37 mg/dL — AB (ref 6–20)
CO2: 27 mmol/L (ref 22–32)
Calcium: 9.8 mg/dL (ref 8.9–10.3)
Chloride: 103 mmol/L (ref 101–111)
Creatinine, Ser: 2.35 mg/dL — ABNORMAL HIGH (ref 0.61–1.24)
GFR calc Af Amer: 32 mL/min — ABNORMAL LOW (ref >60)
GFR calc non Af Amer: 28 mL/min — ABNORMAL LOW (ref >60)
GLUCOSE: 90 mg/dL (ref 65–99)
Potassium: 5.5 mmol/L — ABNORMAL HIGH (ref 3.5–5.1)
Sodium: 139 mmol/L (ref 135–145)
TOTAL PROTEIN: 7.7 g/dL (ref 6.5–8.1)

## 2015-10-25 LAB — PROTIME-INR
INR: 1.13 (ref 0.00–1.49)
Prothrombin Time: 14.6 seconds (ref 11.6–15.2)

## 2015-10-25 LAB — URINE MICROSCOPIC-ADD ON
SQUAMOUS EPITHELIAL / LPF: NONE SEEN
WBC UA: NONE SEEN WBC/hpf (ref 0–5)

## 2015-10-25 LAB — URINALYSIS, ROUTINE W REFLEX MICROSCOPIC
BILIRUBIN URINE: NEGATIVE
GLUCOSE, UA: NEGATIVE mg/dL
KETONES UR: NEGATIVE mg/dL
LEUKOCYTES UA: NEGATIVE
NITRITE: NEGATIVE
PROTEIN: NEGATIVE mg/dL
Specific Gravity, Urine: 1.019 (ref 1.005–1.030)
pH: 5 (ref 5.0–8.0)

## 2015-10-25 LAB — BRAIN NATRIURETIC PEPTIDE: B Natriuretic Peptide: 8.3 pg/mL (ref 0.0–100.0)

## 2015-10-25 LAB — APTT: aPTT: 31 seconds (ref 24–37)

## 2015-10-25 LAB — CREATININE, URINE, RANDOM: CREATININE, URINE: 184.25 mg/dL

## 2015-10-25 LAB — TSH: TSH: 2.58 u[IU]/mL (ref 0.350–4.500)

## 2015-10-25 LAB — TROPONIN I: Troponin I: <0.03 ng/mL (ref <0.031)

## 2015-10-25 LAB — PROCALCITONIN: Procalcitonin: <0.1 ng/mL

## 2015-10-25 MED ORDER — RISPERIDONE 1 MG PO TABS
1.0000 mg | ORAL_TABLET | Freq: Every day | ORAL | Status: DC
  Administered 2015-10-25 – 2015-10-27 (×3): 1 mg via ORAL
  Filled 2015-10-25: qty 2
  Filled 2015-10-25 (×4): qty 1

## 2015-10-25 MED ORDER — MEMANTINE HCL 10 MG PO TABS
10.0000 mg | ORAL_TABLET | Freq: Two times a day (BID) | ORAL | Status: DC
  Administered 2015-10-25 – 2015-10-28 (×6): 10 mg via ORAL
  Filled 2015-10-25 (×8): qty 1

## 2015-10-25 MED ORDER — DONEPEZIL HCL 10 MG PO TABS
10.0000 mg | ORAL_TABLET | Freq: Every day | ORAL | Status: DC
  Administered 2015-10-25 – 2015-10-27 (×3): 10 mg via ORAL
  Filled 2015-10-25 (×2): qty 1
  Filled 2015-10-25: qty 2

## 2015-10-25 MED ORDER — ERYTHROMYCIN 5 MG/GM OP OINT
TOPICAL_OINTMENT | Freq: Three times a day (TID) | OPHTHALMIC | Status: DC
  Administered 2015-10-25 – 2015-10-26 (×2): via OPHTHALMIC
  Administered 2015-10-26: 1 via OPHTHALMIC
  Administered 2015-10-26: 06:00:00 via OPHTHALMIC
  Administered 2015-10-27: 1 via OPHTHALMIC
  Administered 2015-10-27 – 2015-10-28 (×3): via OPHTHALMIC
  Filled 2015-10-25: qty 3.5

## 2015-10-25 MED ORDER — ACETAMINOPHEN 325 MG PO TABS: 650.0000 mg | ORAL_TABLET | ORAL | Status: DC | PRN

## 2015-10-25 MED ORDER — VITAMIN D 1000 UNITS PO TABS
1000.0000 [IU] | ORAL_TABLET | Freq: Two times a day (BID) | ORAL | Status: DC
  Administered 2015-10-25 – 2015-10-28 (×6): 1000 [IU] via ORAL
  Filled 2015-10-25 (×6): qty 1

## 2015-10-25 MED ORDER — FERROUS SULFATE 325 (65 FE) MG PO TABS
325.0000 mg | ORAL_TABLET | Freq: Every day | ORAL | Status: DC
  Administered 2015-10-26 – 2015-10-28 (×3): 325 mg via ORAL
  Filled 2015-10-25 (×5): qty 1

## 2015-10-25 MED ORDER — GABAPENTIN 100 MG PO CAPS
100.0000 mg | ORAL_CAPSULE | Freq: Every day | ORAL | Status: DC
  Administered 2015-10-25 – 2015-10-27 (×3): 100 mg via ORAL
  Filled 2015-10-25 (×3): qty 1

## 2015-10-25 MED ORDER — ARIPIPRAZOLE 10 MG PO TABS
30.0000 mg | ORAL_TABLET | Freq: Every day | ORAL | Status: DC
  Administered 2015-10-26 – 2015-10-28 (×3): 30 mg via ORAL
  Filled 2015-10-25 (×4): qty 3

## 2015-10-25 MED ORDER — SODIUM CHLORIDE 0.9 % IV BOLUS (SEPSIS)
1000.0000 mL | Freq: Once | INTRAVENOUS | Status: AC
  Administered 2015-10-25: 1000 mL via INTRAVENOUS

## 2015-10-25 MED ORDER — LORAZEPAM 0.5 MG PO TABS
0.5000 mg | ORAL_TABLET | ORAL | Status: DC | PRN
  Administered 2015-10-27: 0.5 mg via ORAL
  Filled 2015-10-25: qty 1

## 2015-10-25 MED ORDER — ONDANSETRON HCL 4 MG/2ML IJ SOLN: 4.0000 mg | Freq: Four times a day (QID) | INTRAMUSCULAR | Status: DC | PRN

## 2015-10-25 MED ORDER — DEXTROSE-NACL 5-0.45 % IV SOLN
INTRAVENOUS | Status: DC
  Administered 2015-10-25 – 2015-10-26 (×2): via INTRAVENOUS

## 2015-10-25 MED ORDER — RISPERIDONE 2 MG PO TABS
2.0000 mg | ORAL_TABLET | Freq: Every day | ORAL | Status: DC
  Administered 2015-10-26 – 2015-10-28 (×3): 2 mg via ORAL
  Filled 2015-10-25 (×4): qty 1

## 2015-10-25 MED ORDER — VITAMIN D3 25 MCG (1000 UT) PO CAPS: 1.0000 | ORAL_CAPSULE | Freq: Two times a day (BID) | ORAL | Status: DC

## 2015-10-25 NOTE — ED Provider Notes (Signed)
CSN: [id number]     Arrival date & time 10/25/15  1622 History   First MD Initiated Contact with Patient 10/25/15 1627     Chief Complaint  Patient presents with  . Weakness   level V caveat: Severe MR. HPI Patient presents to the emergency room from [facility]. Patient has history of severe MR. At baseline he is incontinent and drools. Staff at the facility noticed that the patient has been slumped over in his wheelchair since 7 AM this morning. EMS was contacted to bring him to the emergency room. Patient arrives at approximately 4:30 PM.  The history is extremely limited. The patient does answer some questions intermittently with yes and no intermittently follows commands but otherwise does not provide any history. Past Medical History  Diagnosis Date  . Mental retardation   . DVT (deep venous thrombosis) (HCC)     right leg  . Dyslipidemia   . Obesity   . Hypertension   . Dementia 06/19/2014  . Other and unspecified hyperlipidemia 06/19/2014   No past surgical history on file. Family History  Problem Relation Age of Onset  . Hypertension     Social History  Substance Use Topics  . Smoking status: Never Smoker   . Smokeless tobacco: Never Used  . Alcohol Use: No    Review of Systems  Unable to perform ROS: Mental status change      Allergies  Review of patient's allergies indicates no known allergies.  Home Medications   Prior to Admission medications   Medication Sig Start Date End Date Taking? Authorizing Provider  ARIPiprazole (ABILIFY) 30 MG tablet Take 30 mg by mouth daily.   Yes Historical Provider, MD  Cholecalciferol (VITAMIN D3) 1000 UNITS CAPS Take 1 capsule by mouth 2 (two) times daily.     Yes Historical Provider, MD  donepezil (ARICEPT) 10 MG tablet Take 10 mg by mouth at bedtime.    Yes Historical Provider, MD  ferrous sulfate 325 (65 FE) MG tablet Take 325 mg by mouth daily with breakfast.     Yes Historical Provider, MD  furosemide  (LASIX) 40 MG tablet Take 40 mg by mouth daily.   Yes Historical Provider, MD  gabapentin (NEURONTIN) 100 MG capsule Take 100 mg by mouth at bedtime.     Yes Historical Provider, MD  lisinopril (PRINIVIL,ZESTRIL) 10 MG tablet Take 10 mg by mouth daily.     Yes Historical Provider, MD  memantine (NAMENDA) 10 MG tablet Take 10 mg by mouth 2 (two) times daily.     Yes Historical Provider, MD  potassium chloride (KLOR-CON) 10 MEQ CR tablet Take 10 mEq by mouth daily.     Yes Historical Provider, MD  risperiDONE (RISPERDAL) 1 MG tablet Take 1 mg by mouth at bedtime. Reported on 10/25/2015   Yes Historical Provider, MD  risperiDONE (RISPERDAL) 2 MG tablet Take 2 mg by mouth daily with breakfast.   Yes Historical Provider, MD  rivaroxaban (XARELTO) 20 MG TABS tablet Take 1 tablet (20 mg total) by mouth daily with supper. 06/22/15  Yes Penny Pia, MD  LORazepam (ATIVAN) 0.5 MG tablet Take 1 tablet (0.5 mg total) by mouth every 4 (four) hours as needed (for agitation). 06/13/15   Penny Pia, MD   BP 113/77 mmHg  Pulse 85  Temp(Src) 97.4 F (36.3 C) (Oral)  Resp 13  SpO2 98% Physical Exam  Constitutional: He appears well-nourished. No distress.  HENT:  Head: Normocephalic and atraumatic.  Right Ear:  External ear normal.  Left Ear: External ear normal.  Mouth/Throat: No oropharyngeal exudate.  Drooling, eyes are open  Eyes: Conjunctivae are normal. Right eye exhibits no discharge. Left eye exhibits no discharge. No scleral icterus.  Neck: Neck supple. No tracheal deviation present.  Cardiovascular: Normal rate, regular rhythm and intact distal pulses.   Pulmonary/Chest: Effort normal and breath sounds normal. No stridor. No respiratory distress. He has no wheezes. He has no rales.  Abdominal: Soft. Bowel sounds are normal. He exhibits no distension. There is no tenderness. There is no rebound and no guarding.  Musculoskeletal: He exhibits edema. He exhibits no tenderness.  Neurological: He is  alert. He has normal strength. No cranial nerve deficit (no facial droop, extraocular movements intact, no slurred speech) or sensory deficit. He exhibits normal muscle tone. He displays no seizure activity. Coordination normal. GCS eye subscore is 4. GCS verbal subscore is 4. GCS motor subscore is 6.  Patient was able to squeeze with both hands when asked, I could not get him to lift either leg off the bed, patient will answer some questions but cannot provide any other history, patient did become agitated initially when he was watching the TV and there was something violent on the television show  Skin: Skin is warm and dry. No rash noted.  Psychiatric: He has a normal mood and affect.  Nursing note and vitals reviewed.   ED Course  Procedures (including critical care time) Labs Review Labs Reviewed  CBC - Abnormal; Notable for the following:    RBC 4.04 (*)    Hemoglobin 12.1 (*)    HCT 35.7 (*)    RDW 16.0 (*)    All other components within normal limits  COMPREHENSIVE METABOLIC PANEL - Abnormal; Notable for the following:    Potassium 5.5 (*)    BUN 37 (*)    Creatinine, Ser 2.35 (*)    AST 46 (*)    GFR calc non Af Amer 28 (*)    GFR calc Af Amer 32 (*)    All other components within normal limits  PROTIME-INR  APTT  DIFFERENTIAL  URINALYSIS, ROUTINE W REFLEX MICROSCOPIC (NOT AT [facility])    Imaging Review Dg Chest 2 View  10/25/2015  CLINICAL DATA:  Slumped over, changes behavior EXAM: CHEST  2 VIEW COMPARISON:  06/18/2014 FINDINGS: There is elevation of the right diaphragm. There is mild bilateral interstitial thickening. There is no focal parenchymal opacity. There is no pleural effusion or pneumothorax. There is cardiomegaly. The osseous structures are unremarkable. IMPRESSION: Cardiomegaly with mild pulmonary vascular congestion. Electronically Signed   By: Elige Ko   On: 10/25/2015 18:36   Ct Head Wo Contrast  10/25/2015  CLINICAL DATA:  Slumped over, change in  behavior EXAM: CT HEAD WITHOUT CONTRAST TECHNIQUE: Contiguous axial images were obtained from the base of the skull through the vertex without intravenous contrast. COMPARISON:  02/10/2011 FINDINGS: There is no evidence of mass effect, midline shift, or extra-axial fluid collections. There is no evidence of a space-occupying lesion or intracranial hemorrhage. There is no evidence of a cortical-based area of acute infarction. The ventricles and sulci are appropriate for the patient's age. The basal cisterns are patent. Visualized portions of the orbits are unremarkable. The visualized portions of the paranasal sinuses and mastoid air cells are unremarkable. The osseous structures are unremarkable. IMPRESSION: Normal CT of the brain without intravenous contrast. Electronically Signed   By: Elige Ko   On: 10/25/2015 18:39   I  have personally reviewed and evaluated these images and lab results as part of my medical decision-making.   EKG Interpretation   Date/Time:  Saturday October 25 2015 16:47:58 EST Ventricular Rate:  69 PR Interval:  232 QRS Duration: 99 QT Interval:  358 QTC Calculation: 383 R Axis:   8 Text Interpretation:  Sinus rhythm Prolonged PR interval ST elevation,  consider inferior injury No significant change since last tracing  Confirmed by Marinna Blane  MD-J, Maegen Wigle ([id number]) on 10/25/2015 5:04:55 PM      MDM   Final diagnoses:  Dehydration  Acute renal failure, unspecified acute renal failure type (HCC)    Hx is very limited but according to reports pt was listless and not active today.  Initial concerns for possible stroke although on my exam, albeit limited by his mental state, does not show any focal deficits.  CT scan without acute findings.  Labs do show that the patient is in acute renal failure with an increase in his creatinine from 1.2 to 2.35.  Potassium is also slightly increased without ECG changes.  Suspect his symptoms may be related to dehydration and acute kidney  injury. Will continue IV fluid bolus and plan on admission for IV fluids and further evaluation.    Linwood DibblesJon Traycen Goyer, MD 10/26/15 [phone]

## 2015-10-25 NOTE — ED Notes (Signed)
Attempted to assist patient with urinal. Pt did not need to void. Patient transported to CT & XRAY

## 2015-10-25 NOTE — ED Notes (Signed)
Attempted in and out cath. Pt tolerated well. No urine return. Brief that patient had on since arrival was dry. MD aware.

## 2015-10-25 NOTE — ED Notes (Signed)
Per EMS- pt [facility]. Pt with a hx of sever MR. Pt normally drools and is incontinent. Pt can become aggressive easily, would not allow EMS to take vitals. Pt has been slumped in his wheelchair since 0700. Originially EMS was called out for pain but facility unable to tell EMS where patient is hurting. Pt currently denies pain and did not want to be transported to ED. CBG 108. Pt currently slumped to the right side.

## 2015-10-25 NOTE — H&P (Signed)
[facility] History and Physical  [name] Bakernest Corpening ZOX:[id number] DOB: 07/21/1951 DOA: 10/25/2015  Referring physician: ED physician PCP: Colon BranchQURESHI, AYYAZ, MD  Specialists:  Dr. Kirke CorinArida [facility])   Chief Complaint: Lethargy   HPI: [name] is a 65 y.o. 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 The [facility] comes [facility] where he is said to be disoriented, incontinent, and drooling at baseline with proclivity for aggressive behavior, but was noted early in the day of admission to be unusually lethargic.  He is essentially wheelchair bound during the days but is typically alert. This morning he was noted to be slumped over in his wheelchair, and while he was arousable, lethargy persisted throughout the day, prompting activation of EMS. History is obtained through discussion with ED personnel and review of ALF chart notes and EMR. Patient was reportedly in his usual state of health in the days leading up to his admission, with no documented fever or change in medications.  In ED, patient was found to be afebrile, saturating well on room air, and initially with borderline tachycardia and hypotension.  Heart rate and blood pressure quickly normalized with IV fluid. Chest x-ray demonstrated cardiomegaly with mild pulmonary vascular congestion and EKG reveals a first-degree AV block but is unchanged from priors. Basic blood work returns with serum creatinine of 2.35, up from apparent baseline of 1.2. A mild normocytic anemia is also noted on initial labs. In and out cath was performed twice in the emergency department with no return of urine. Patient was bolused 2 L of normal saline, remained hemodynamically stable, and was admitted to the hospital for ongoing evaluation and management of  AKI.  Where does patient live?  [facility] Can patient participate in ADLs?   Little         Review of Systems:   Unable to obtain secondary to the patient's clinical condition with chronic, severe cognitive deficits.   Allergy: No Known Allergies  Past Medical History  Diagnosis Date  . Mental retardation   . DVT (deep venous thrombosis) (HCC)     right leg  . Dyslipidemia   . Obesity   . Hypertension   . Dementia 06/19/2014  . Other and unspecified hyperlipidemia 06/19/2014    History reviewed. No pertinent past surgical history.  Social History:  reports that he has never smoked. He has never used smokeless tobacco. He reports that he does not drink alcohol or use illicit drugs.  Family History:  Family History  Problem Relation Age of Onset  . Hypertension       Prior to Admission medications   Medication Sig Start Date End Date Taking? Authorizing Provider  ARIPiprazole (ABILIFY) 30 MG tablet Take 30 mg by mouth daily.   Yes Historical Provider, MD  Cholecalciferol (VITAMIN D3) 1000 UNITS CAPS Take 1 capsule by mouth 2 (two) times daily.     Yes Historical Provider, MD  donepezil (ARICEPT) 10 MG tablet Take 10 mg by mouth at bedtime.    Yes Historical Provider, MD  ferrous sulfate 325 (65 FE) MG tablet Take 325 mg by mouth daily with breakfast.     Yes Historical Provider, MD  furosemide (LASIX) 40 MG tablet Take 40 mg by mouth daily.   Yes Historical  Provider, MD  gabapentin (NEURONTIN) 100 MG capsule Take 100 mg by mouth at bedtime.     Yes Historical Provider, MD  lisinopril (PRINIVIL,ZESTRIL) 10 MG tablet Take 10 mg by mouth daily.     Yes Historical Provider, MD  memantine (NAMENDA) 10 MG tablet Take 10 mg by mouth 2 (two) times daily.     Yes Historical Provider, MD  potassium chloride (KLOR-CON) 10 MEQ CR tablet Take 10 mEq by mouth daily.     Yes Historical Provider, MD  risperiDONE (RISPERDAL) 1 MG tablet Take 1 mg by mouth at bedtime.  Reported on 10/25/2015   Yes Historical Provider, MD  risperiDONE (RISPERDAL) 2 MG tablet Take 2 mg by mouth daily with breakfast.   Yes Historical Provider, MD  rivaroxaban (XARELTO) 20 MG TABS tablet Take 1 tablet (20 mg total) by mouth daily with supper. 06/22/15  Yes Penny Pia, MD  LORazepam (ATIVAN) 0.5 MG tablet Take 1 tablet (0.5 mg total) by mouth every 4 (four) hours as needed (for agitation). 06/13/15   Penny Pia, MD    Physical Exam: Filed Vitals:   10/25/15 1715 10/25/15 1730 10/25/15 1927 10/25/15 1930  BP: 108/86 108/79 113/77 116/74  Pulse: 88 79 85 86  Temp:      TempSrc:      Resp: 13 13    SpO2: 98% 99% 98% 98%   General: Not in acute respiratory distress HEENT:       Eyes: PERRL, EOMI, b/l conjunctival injection, thick white discharge from right eye, b/l eyelashes crusted over        ENT:  Thick, clear nasal discharge bilaterally, tacky oral mucosa, no otorrhea         Neck: No JVD, no bruit, no appreciable mass Heme: No cervical adenopathy, no pallor Cardiac: S1/S2, RRR, No murmurs, No gallops or rubs. Pulm: Good air movement bilaterally. No rales, wheezing, rhonchi or rubs. Abd: Soft, nondistended, nontender, no rebound pain or gaurding, no mass or organomegaly, BS present. Ext: Pretibial pitting edema b/l, Rt >> Lt. 2+DP/PT pulse bilaterally. Musculoskeletal: No gross deformity, no red, hot, swollen joints   Skin: No rashes or wounds on exposed surfaces  Neuro: Alert, makes eye-contact, disoriented, 1-2 word answers to simple questioning, PERRL, EOMI, no facial asymmetry, follows instructions, moving all extremities equally,brachial reflex 2+ bilaterally, patellar reflex 2+ bilaterally. Negative Babinski's sign. No focal findings Psych: Pleasant, cooperative.  Labs on Admission:  Basic Metabolic Panel:  Recent Labs Lab 10/25/15 1744  NA 139  K 5.5*  CL 103  CO2 27  GLUCOSE 90  BUN 37*  CREATININE 2.35*  CALCIUM 9.8   Liver Function  Tests:  Recent Labs Lab 10/25/15 1744  AST 46*  ALT 31  ALKPHOS 88  BILITOT 0.3  PROT 7.7  ALBUMIN 3.5   No results for input(s): LIPASE, AMYLASE in the last 168 hours. No results for input(s): AMMONIA in the last 168 hours. CBC:  Recent Labs Lab 10/25/15 1744  WBC 8.4  NEUTROABS 5.0  HGB 12.1*  HCT 35.7*  MCV 88.4  PLT 230   Cardiac Enzymes: No results for input(s): CKTOTAL, CKMB, CKMBINDEX, TROPONINI in the last 168 hours.  BNP (last 3 results) No results for input(s): BNP in the last 8760 hours.  ProBNP (last 3 results) No results for input(s): PROBNP in the last 8760 hours.  CBG: No results for input(s): GLUCAP in the last 168 hours.  Radiological Exams on Admission: Dg Chest 2 View  10/25/2015  CLINICAL  DATA:  Slumped over, changes behavior EXAM: CHEST  2 VIEW COMPARISON:  06/18/2014 FINDINGS: There is elevation of the right diaphragm. There is mild bilateral interstitial thickening. There is no focal parenchymal opacity. There is no pleural effusion or pneumothorax. There is cardiomegaly. The osseous structures are unremarkable. IMPRESSION: Cardiomegaly with mild pulmonary vascular congestion. Electronically Signed   By: Elige Ko   On: 10/25/2015 18:36   Ct Head Wo Contrast  10/25/2015  CLINICAL DATA:  Slumped over, change in behavior EXAM: CT HEAD WITHOUT CONTRAST TECHNIQUE: Contiguous axial images were obtained from the base of the skull through the vertex without intravenous contrast. COMPARISON:  02/10/2011 FINDINGS: There is no evidence of mass effect, midline shift, or extra-axial fluid collections. There is no evidence of a space-occupying lesion or intracranial hemorrhage. There is no evidence of a cortical-based area of acute infarction. The ventricles and sulci are appropriate for the patient's age. The basal cisterns are patent. Visualized portions of the orbits are unremarkable. The visualized portions of the paranasal sinuses and mastoid air cells are  unremarkable. The osseous structures are unremarkable. IMPRESSION: Normal CT of the brain without intravenous contrast. Electronically Signed   By: Elige Ko   On: 10/25/2015 18:39    EKG: Independently reviewed.  Abnormal findings:   Sinus rhythm, first degree AV block, not significantly changed from prior  Assessment/Plan  1. AKI on CKD 3 - SCr 2.35 on admission, up from baseline of ~1.2 - Suspect prerenal azotemia in setting of acute viral URI and dehydration; checking urine studies and viral panel to support/refute this working diagnosis  - 2 L NS bolused in ED, continuing on IVF with D5-1/2 NS at 100 cc/hr  - Holding Lasix and lisinopril in the setting of AKI suspected secondary to dehydration, will resume as appropriate - Holding Xarelto, will resume once renal fxn stabilizes - Strict I/Os, daily wts  - Avoid nephrotoxins and hypotension as possible    2. Acute viral URI, conjunctivitis  - Rhinorrhea and conjunctivitis on exam  - Allergic rhinitis and conjunctivitis felt to be less likely  - Check viral panel  - Suspect this as cause of pt's dehydration and subsequent AKI  - Afebrile, monitoring   - Supportive care with fluids   3. Normocytic anemia - Hgb 12.1 on admission, up from baseline of 10-11  - Suspect apparent improvement actually reflective of dehydration and hemoconcentration  - Continue iron-supplementation   4. Chronic LE DVT on Xarelto  - There is b/l LE edema, Rt >> Lt, chronic per chart review  - No erythema or tenderness to suggest acute DVT  - No tachycardia, tachypnea, hypoxia, or cough to suggest acute PE  - Holding Xarelto tonight given AKI, will continue as soon as renal fxn stabilizes    5. Hypertension - Initial BP in ED was 89/64, improved with IVF  - Holding home lisinopril at this time given AKI and soft BP  - Resume antihypertensive as needed; hold ACEi until renal fxn stabilizes    6. Chronic diastolic CHF  - TTE (07/22/2011) EF 55-60%  with grade 1 diastolic dysfunction  - Holding Lasix while fluid resuscitating  - Admission CXR has mild vascular congestion, raising concern for precipitating respiratory compromise with IVF resuscitation  - Will continue with IVF as pt clinically dry with elevated BUN and SCr, but follow respiratory status closely  - Resume Lasix prn   7. Hyperkalemia  - Serum K+ 5.5 on admission, likely secondary to AKI   -  No  EKG changes  - Anticipate resolution with IVF, will repeat BMP overnight to ensure not increasing further     DVT ppx:  Xarelto  Code Status: Full code Family Communication: None at bed side.   Disposition Plan: Admit to inpatient   Date of Service 10/25/2015    Briscoe Deutscher, MD [facility] Pager [phone]  If 7PM-7AM, please contact night-coverage [email or web address] Password [facility] 10/25/2015, 8:26 PM

## 2015-10-26 DIAGNOSIS — I825Z3 Chronic embolism and thrombosis of unspecified deep veins of distal lower extremity, bilateral: Secondary | ICD-10-CM | POA: Diagnosis present

## 2015-10-26 DIAGNOSIS — N179 Acute kidney failure, unspecified: Principal | ICD-10-CM

## 2015-10-26 DIAGNOSIS — E860 Dehydration: Secondary | ICD-10-CM | POA: Diagnosis present

## 2015-10-26 DIAGNOSIS — Z993 Dependence on wheelchair: Secondary | ICD-10-CM | POA: Diagnosis not present

## 2015-10-26 DIAGNOSIS — N183 Chronic kidney disease, stage 3 (moderate): Secondary | ICD-10-CM | POA: Diagnosis present

## 2015-10-26 DIAGNOSIS — I82501 Chronic embolism and thrombosis of unspecified deep veins of right lower extremity: Secondary | ICD-10-CM

## 2015-10-26 DIAGNOSIS — E669 Obesity, unspecified: Secondary | ICD-10-CM | POA: Diagnosis present

## 2015-10-26 DIAGNOSIS — Z8249 Family history of ischemic heart disease and other diseases of the circulatory system: Secondary | ICD-10-CM | POA: Diagnosis not present

## 2015-10-26 DIAGNOSIS — I959 Hypotension, unspecified: Secondary | ICD-10-CM | POA: Diagnosis present

## 2015-10-26 DIAGNOSIS — I5032 Chronic diastolic (congestive) heart failure: Secondary | ICD-10-CM | POA: Diagnosis present

## 2015-10-26 DIAGNOSIS — F0390 Unspecified dementia without behavioral disturbance: Secondary | ICD-10-CM | POA: Diagnosis present

## 2015-10-26 DIAGNOSIS — E875 Hyperkalemia: Secondary | ICD-10-CM | POA: Diagnosis present

## 2015-10-26 DIAGNOSIS — J069 Acute upper respiratory infection, unspecified: Secondary | ICD-10-CM

## 2015-10-26 DIAGNOSIS — F72 Severe intellectual disabilities: Secondary | ICD-10-CM | POA: Diagnosis present

## 2015-10-26 DIAGNOSIS — H109 Unspecified conjunctivitis: Secondary | ICD-10-CM | POA: Diagnosis present

## 2015-10-26 DIAGNOSIS — E785 Hyperlipidemia, unspecified: Secondary | ICD-10-CM | POA: Diagnosis present

## 2015-10-26 DIAGNOSIS — I440 Atrioventricular block, first degree: Secondary | ICD-10-CM | POA: Diagnosis present

## 2015-10-26 DIAGNOSIS — Z7902 Long term (current) use of antithrombotics/antiplatelets: Secondary | ICD-10-CM | POA: Diagnosis not present

## 2015-10-26 DIAGNOSIS — D649 Anemia, unspecified: Secondary | ICD-10-CM | POA: Diagnosis present

## 2015-10-26 DIAGNOSIS — Z6835 Body mass index (BMI) 35.0-35.9, adult: Secondary | ICD-10-CM | POA: Diagnosis not present

## 2015-10-26 DIAGNOSIS — I130 Hypertensive heart and chronic kidney disease with heart failure and stage 1 through stage 4 chronic kidney disease, or unspecified chronic kidney disease: Secondary | ICD-10-CM | POA: Diagnosis present

## 2015-10-26 LAB — BASIC METABOLIC PANEL
ANION GAP: 12 (ref 5–15)
BUN: 36 mg/dL — ABNORMAL HIGH (ref 6–20)
CO2: 25 mmol/L (ref 22–32)
Calcium: 9.5 mg/dL (ref 8.9–10.3)
Chloride: 105 mmol/L (ref 101–111)
Creatinine, Ser: 2.06 mg/dL — ABNORMAL HIGH (ref 0.61–1.24)
GFR calc Af Amer: 38 mL/min — ABNORMAL LOW (ref >60)
GFR, EST NON AFRICAN AMERICAN: 33 mL/min — AB (ref >60)
Glucose, Bld: 45 mg/dL — ABNORMAL LOW (ref 65–99)
POTASSIUM: 5.4 mmol/L — AB (ref 3.5–5.1)
SODIUM: 142 mmol/L (ref 135–145)

## 2015-10-26 LAB — TROPONIN I: Troponin I: <0.03 ng/mL (ref <0.031)

## 2015-10-26 MED ORDER — SODIUM CHLORIDE 0.9 % IV SOLN
INTRAVENOUS | Status: DC
  Administered 2015-10-26: 13:00:00 via INTRAVENOUS

## 2015-10-26 MED ORDER — RIVAROXABAN 20 MG PO TABS
20.0000 mg | ORAL_TABLET | Freq: Every day | ORAL | Status: DC
  Administered 2015-10-26 – 2015-10-27 (×2): 20 mg via ORAL
  Filled 2015-10-26 (×2): qty 1

## 2015-10-26 NOTE — Progress Notes (Signed)
ANTICOAGULATION CONSULT NOTE - Initial Consult  Pharmacy Consult for xarelto Indication: chronic DVT  No Known Allergies  Patient Measurements: Height: 5\' 9"  (175.3 cm) Weight: 240 lb 4.8 oz (109 kg) IBW/kg (Calculated) : 70.7   Vital Signs: Temp: 98.6 F (37 C) (01/15 0525) Temp Source: Oral (01/15 0525) BP: 126/81 mmHg (01/15 0525) Pulse Rate: 97 (01/15 0525)  Labs:  Recent Labs  10/25/15 1744 10/25/15 2033 10/26/15 0233 10/26/15 0740  HGB 12.1*  --   --   --   HCT 35.7*  --   --   --   PLT 230  --   --   --   APTT 31  --   --   --   LABPROT 14.6  --   --   --   INR 1.13  --   --   --   CREATININE 2.35* 2.07* 2.06*  --   TROPONINI  --  <0.03 <0.03 <0.03    Estimated Creatinine Clearance: 45.2 mL/min (by C-G formula based on Cr of 2.06).   Medical History: Past Medical History  Diagnosis Date  . Mental retardation   . DVT (deep venous thrombosis) (HCC)     right leg  . Dyslipidemia   . Obesity   . Hypertension   . Dementia 06/19/2014  . Other and unspecified hyperlipidemia 06/19/2014     Assessment: 65 yo M on Xarelto PTA for chronic DVT. Last dose PTA 1/13.  He has elevated creatinine 2nd dehydration, which is improving.  The dose of xarelto for 2ndary prevention of DVT/PE after initial 6 months is 20 qsupper per EINSTEIN trial. The study used TBW for creat cl, so creat cl is 58 ml/min with his creat 2.06 today.   Goal of Therapy:  Prevent VTE/PE   Plan:  Resume home dose of xarelto 20 mg qsupper F/u renal fxn F/u for s/sx bleeding  Herby AbrahamMichelle T. Saralee Bolick, Pharm.D. [phone] 10/26/2015 1:59 PM

## 2015-10-26 NOTE — Progress Notes (Signed)
[facility] PROGRESS NOTE  [name] ZOX:[id number] DOB: 01-29-1951 DOA: 10/25/2015 PCP: Colon Branch, MD  Assessment/Plan: 1. AKI on CKD 3 - SCr 2.35 on admission, up from baseline of ~1.3, 1.4 - Suspect prerenal azotemia in setting of acute viral URI and dehydration and ACE, diuretics - Holding Lasix and lisinopril  - continue IVF today, Strict I/Os, daily wts  - Avoid nephrotoxins  2. Viral URI - Supportive care, IVF   3. Normocytic anemia - Hgb 12.1 on admission, up from baseline of 10-11  - stable  4. 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 Hypertension - Initial BP in ED was 89/64, improved with IVF  - stable, improved now, ACE on hold  6. Chronic diastolic CHF  - TTE (07/22/2011) EF 55-60% with grade 1 diastolic dysfunction  - Holding Lasix, hydrating, clinically no s/s of volume overload, monitor closely  7. Hyperkalemia  - Serum K+ 5.5 on admission, likely secondary to AKI  - No EKG changes   8. 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  DVT ppx: Xarelto  Code Status: FUll COde Family Communication: none at bedside Disposition Plan: back to group home when imrpoved   HPI/Subjective: No events, somnolent but was more awake earlier and ate breakfast  Objective: Filed Vitals:   10/26/15 0454 10/26/15 0525  BP:  126/81  Pulse:  97  Temp: 98.8 F (37.1 C) 98.6 F (37 C)  Resp:  16    Intake/Output Summary (Last 24 hours) at 10/26/15 1250 Last data filed at 10/26/15 0644  Gross per 24 hour  Intake   1080 ml  Output    400 ml  Net    680 ml   Filed Weights   10/26/15 0525  Weight: 109 kg (240 lb 4.8 oz)    Exam:   General:  Awake, dysarthric, no distress, oriented to self only, occasional tics noted  Cardiovascular: S1S2/RRR  Respiratory:  CTAB  Abdomen: soft, Nt, BS present  Musculoskeletal: no edema c/c  Neuro: no localising signs  Data Reviewed: Basic Metabolic Panel:  Recent Labs Lab 10/25/15 1744 10/25/15 2033 10/26/15 0233  NA 139 139 142  K 5.5* 4.8 5.4*  CL 103 104 105  CO2 27 25 25   GLUCOSE 90 83 45*  BUN 37* 36* 36*  CREATININE 2.35* 2.07* 2.06*  CALCIUM 9.8 9.3 9.5   Liver Function Tests:  Recent Labs Lab 10/25/15 1744  AST 46*  ALT 31  ALKPHOS 88  BILITOT 0.3  PROT 7.7  ALBUMIN 3.5   No results for input(s): LIPASE, AMYLASE in the last 168 hours. No results for input(s): AMMONIA in the last 168 hours. CBC:  Recent Labs Lab 10/25/15 1744  WBC 8.4  NEUTROABS 5.0  HGB 12.1*  HCT 35.7*  MCV 88.4  PLT 230   Cardiac Enzymes:  Recent Labs Lab 10/25/15 2033 10/26/15 0233 10/26/15 0740  TROPONINI <0.03 <0.03 <0.03   BNP (last 3 results)  Recent Labs  10/25/15 2033  BNP 8.3    ProBNP (last 3 results) No results for input(s): PROBNP in the last 8760 hours.  CBG: No results for input(s): GLUCAP in the last 168 hours.  Recent Results (from the past 240 hour(s))  Culture, Urine     Status: None (Preliminary result)   Collection Time: 10/25/15  10:33 PM  Result Value Ref Range Status   Specimen Description URINE, RANDOM  Final   Special Requests NONE  Final   Culture NO GROWTH < 12 HOURS  Final   Report Status PENDING  Incomplete     Studies: Dg Chest 2 View  10/25/2015  CLINICAL DATA:  Slumped over, changes behavior EXAM: CHEST  2 VIEW COMPARISON:  06/18/2014 FINDINGS: There is elevation of the right diaphragm. There is mild bilateral interstitial thickening. There is no focal parenchymal opacity. There is no pleural effusion or pneumothorax. There is cardiomegaly. The osseous structures are unremarkable. IMPRESSION: Cardiomegaly with mild pulmonary vascular congestion. Electronically Signed   By: Elige KoHetal  Patel   On: 10/25/2015 18:36   Ct Head Wo Contrast  10/25/2015   CLINICAL DATA:  Slumped over, change in behavior EXAM: CT HEAD WITHOUT CONTRAST TECHNIQUE: Contiguous axial images were obtained from the base of the skull through the vertex without intravenous contrast. COMPARISON:  02/10/2011 FINDINGS: There is no evidence of mass effect, midline shift, or extra-axial fluid collections. There is no evidence of a space-occupying lesion or intracranial hemorrhage. There is no evidence of a cortical-based area of acute infarction. The ventricles and sulci are appropriate for the patient's age. The basal cisterns are patent. Visualized portions of the orbits are unremarkable. The visualized portions of the paranasal sinuses and mastoid air cells are unremarkable. The osseous structures are unremarkable. IMPRESSION: Normal CT of the brain without intravenous contrast. Electronically Signed   By: Elige KoHetal  Patel   On: 10/25/2015 18:39    Scheduled Meds: . ARIPiprazole  30 mg Oral Daily  . cholecalciferol  1,000 Units Oral BID  . donepezil  10 mg Oral QHS  . erythromycin   Right Eye 3 times per day  . ferrous sulfate  325 mg Oral Q breakfast  . gabapentin  100 mg Oral QHS  . memantine  10 mg Oral BID  . risperiDONE  1 mg Oral QHS  . risperiDONE  2 mg Oral Q breakfast   Continuous Infusions: . sodium chloride     Antibiotics Given (last 72 hours)    None      Principal Problem:   Acute kidney injury (HCC) Active Problems:   Hypertension   Anemia   CKD (chronic kidney disease) stage 3, GFR 30-59 ml/min   DVT of leg (deep venous thrombosis) (HCC)   Dehydration   Viral URI   AKI (acute kidney injury) (HCC)   Conjunctivitis    Time spent: 35min    [facility]  LLCJOSEPH,[name]  [facility] Pager [phone]. If 7PM-7AM, please contact night-coverage at [email or web address], password [email or web address] 10/26/2015, 12:50 PM

## 2015-10-27 LAB — CBC
HCT: 32.6 % — ABNORMAL LOW (ref 39.0–52.0)
HEMOGLOBIN: 10.6 g/dL — AB (ref 13.0–17.0)
MCH: 28.9 pg (ref 26.0–34.0)
MCHC: 32.5 g/dL (ref 30.0–36.0)
MCV: 88.8 fL (ref 78.0–100.0)
Platelets: 203 10*3/uL (ref 150–400)
RBC: 3.67 MIL/uL — AB (ref 4.22–5.81)
RDW: 16 % — ABNORMAL HIGH (ref 11.5–15.5)
WBC: 7.6 10*3/uL (ref 4.0–10.5)

## 2015-10-27 LAB — BASIC METABOLIC PANEL
Anion gap: 6 (ref 5–15)
BUN: 22 mg/dL — AB (ref 6–20)
CHLORIDE: 106 mmol/L (ref 101–111)
CO2: 27 mmol/L (ref 22–32)
Calcium: 9.2 mg/dL (ref 8.9–10.3)
Creatinine, Ser: 1.58 mg/dL — ABNORMAL HIGH (ref 0.61–1.24)
GFR calc Af Amer: 52 mL/min — ABNORMAL LOW (ref >60)
GFR calc non Af Amer: 45 mL/min — ABNORMAL LOW (ref >60)
GLUCOSE: 83 mg/dL (ref 65–99)
POTASSIUM: 4.4 mmol/L (ref 3.5–5.1)
SODIUM: 139 mmol/L (ref 135–145)

## 2015-10-27 LAB — URINE CULTURE: Culture: NO GROWTH

## 2015-10-27 LAB — PROCALCITONIN: Procalcitonin: <0.1 ng/mL

## 2015-10-27 LAB — UREA NITROGEN, URINE: Urea Nitrogen, Ur: 910 mg/dL

## 2015-10-27 NOTE — Progress Notes (Signed)
[facility] PROGRESS NOTE  [name] ZOX:[id number] DOB: 05-11-1951 DOA: 10/25/2015 PCP: Colon Branch, MD  Assessment/Plan: 1. AKI on CKD 3 - SCr 2.35 on admission, up from baseline of ~1.3, 1.4 - Suspect prerenal azotemia in setting of acute viral URI and dehydration and ACE, diuretics - Holding Lasix and lisinopril  - much improved, almost at baseline, resume lasix at lower dose at discharge  - Avoid nephrotoxins  2. Viral URI -improved - Supportive care, IVF   3. Normocytic anemia - Hgb 12.1 on admission, up from baseline of 10-11  - stable  4. 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 Hypertension - Initial BP in ED was 89/64, improved with IVF  - stable, improved now, ACE on hold  6. Chronic diastolic CHF  - TTE (07/22/2011) EF 55-60% with grade 1 diastolic dysfunction  - Holding Lasix, stop IVF, resume lasix tomorrow  7. Hyperkalemia  - Serum K+ 5.5 on admission, likely secondary to AKI  - No EKG changes  -resolved  8. 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  DVT ppx: Xarelto  Code Status: FUll COde Family Communication: none at bedside Disposition Plan: back to ALF tomorrow   HPI/Subjective: Doing better, just ate all breakfast  Objective: Filed Vitals:   10/26/15 2215 10/27/15 0555  BP: 118/85 149/94  Pulse: 92 75  Temp: 98.3 F (36.8 C) 97.5 F (36.4 C)  Resp: 18 18    Intake/Output Summary (Last 24 hours) at 10/27/15 1551 Last data filed at 10/27/15 0915  Gross per 24 hour  Intake   1810 ml  Output    750 ml  Net   1060 ml   Filed Weights   10/26/15 0525 10/27/15 0555  Weight: 109 kg (240 lb 4.8 oz) 110.1 kg (242 lb 11.6 oz)    Exam:   General:  Awake, dysarthric, no distress, oriented to self only, occasional tics noted, more lucid  today  Cardiovascular: S1S2/RRR  Respiratory: CTAB  Abdomen: soft, Nt, BS present  Musculoskeletal: no edema c/c  Neuro: no localising signs  Data Reviewed: Basic Metabolic Panel:  Recent Labs Lab 10/25/15 1744 10/25/15 2033 10/26/15 0233 10/27/15 0616  NA 139 139 142 139  K 5.5* 4.8 5.4* 4.4  CL 103 104 105 106  CO2 27 25 25 27   GLUCOSE 90 83 45* 83  BUN 37* 36* 36* 22*  CREATININE 2.35* 2.07* 2.06* 1.58*  CALCIUM 9.8 9.3 9.5 9.2   Liver Function Tests:  Recent Labs Lab 10/25/15 1744  AST 46*  ALT 31  ALKPHOS 88  BILITOT 0.3  PROT 7.7  ALBUMIN 3.5   No results for input(s): LIPASE, AMYLASE in the last 168 hours. No results for input(s): AMMONIA in the last 168 hours. CBC:  Recent Labs Lab 10/25/15 1744 10/27/15 0616  WBC 8.4 7.6  NEUTROABS 5.0  --   HGB 12.1* 10.6*  HCT 35.7* 32.6*  MCV 88.4 88.8  PLT 230 203   Cardiac Enzymes:  Recent Labs Lab 10/25/15 2033 10/26/15 0233 10/26/15 0740  TROPONINI <0.03 <0.03 <0.03   BNP (last 3 results)  Recent Labs  10/25/15 2033  BNP 8.3    ProBNP (last 3 results) No results for input(s): PROBNP in the last 8760 hours.  CBG: No results for input(s): GLUCAP in the last  168 hours.  Recent Results (from the past 240 hour(s))  Culture, Urine     Status: None   Collection Time: 10/25/15 10:33 PM  Result Value Ref Range Status   Specimen Description URINE, RANDOM  Final   Special Requests NONE  Final   Culture NO GROWTH 2 DAYS  Final   Report Status 10/27/2015 FINAL  Final     Studies: Dg Chest 2 View  10/25/2015  CLINICAL DATA:  Slumped over, changes behavior EXAM: CHEST  2 VIEW COMPARISON:  06/18/2014 FINDINGS: There is elevation of the right diaphragm. There is mild bilateral interstitial thickening. There is no focal parenchymal opacity. There is no pleural effusion or pneumothorax. There is cardiomegaly. The osseous structures are unremarkable. IMPRESSION: Cardiomegaly with mild pulmonary  vascular congestion. Electronically Signed   By: Elige KoHetal  Patel   On: 10/25/2015 18:36   Ct Head Wo Contrast  10/25/2015  CLINICAL DATA:  Slumped over, change in behavior EXAM: CT HEAD WITHOUT CONTRAST TECHNIQUE: Contiguous axial images were obtained from the base of the skull through the vertex without intravenous contrast. COMPARISON:  02/10/2011 FINDINGS: There is no evidence of mass effect, midline shift, or extra-axial fluid collections. There is no evidence of a space-occupying lesion or intracranial hemorrhage. There is no evidence of a cortical-based area of acute infarction. The ventricles and sulci are appropriate for the patient's age. The basal cisterns are patent. Visualized portions of the orbits are unremarkable. The visualized portions of the paranasal sinuses and mastoid air cells are unremarkable. The osseous structures are unremarkable. IMPRESSION: Normal CT of the brain without intravenous contrast. Electronically Signed   By: Elige KoHetal  Patel   On: 10/25/2015 18:39    Scheduled Meds: . ARIPiprazole  30 mg Oral Daily  . cholecalciferol  1,000 Units Oral BID  . donepezil  10 mg Oral QHS  . erythromycin   Right Eye 3 times per day  . ferrous sulfate  325 mg Oral Q breakfast  . gabapentin  100 mg Oral QHS  . memantine  10 mg Oral BID  . risperiDONE  1 mg Oral QHS  . risperiDONE  2 mg Oral Q breakfast  . rivaroxaban  20 mg Oral Q supper   Continuous Infusions:   Antibiotics Given (last 72 hours)    None      Principal Problem:   Acute kidney injury (HCC) Active Problems:   Hypertension   Anemia   CKD (chronic kidney disease) stage 3, GFR 30-59 ml/min   DVT of leg (deep venous thrombosis) (HCC)   Dehydration   Viral URI   AKI (acute kidney injury) (HCC)   Conjunctivitis    Time spent: 35min    [facility] IndianaJOSEPH,Carvell Hoeffner  [facility] Pager [phone]. If 7PM-7AM, please contact night-coverage at [email or web address], password [email or web address] 10/27/2015, 3:51 PM  LOS: 1 day

## 2015-10-28 MED ORDER — FUROSEMIDE 20 MG PO TABS: 20.0000 mg | ORAL_TABLET | Freq: Every day | ORAL | Status: DC

## 2015-10-28 NOTE — Discharge Summary (Signed)
Physician Discharge Summary  [name] ZOX:[id number] DOB: 1951/01/01 DOA: 10/25/2015  PCP: Colon Branch, MD  Admit date: 10/25/2015 Discharge date: 10/28/2015  Time spent: 35 minutes  Recommendations for Outpatient Follow-up:  1. PCP Dr.Qureshi in 1 week, Lisinopril stopped and lasix dose decreased 2. Please monitor volume status at FU and monitor Bmet periodically   Discharge Diagnoses:  Principal Problem:   Acute kidney injury (HCC)   Severe mental retardation    Hypertension   Anemia   CKD (chronic kidney disease) stage 3, GFR 30-59 ml/min   DVT of leg (deep venous thrombosis) (HCC)   Dehydration   Viral URI   AKI (acute kidney injury) (HCC)   Conjunctivitis   Discharge Condition: stable  Diet recommendation: heart healthy  Filed Weights   10/26/15 0525 10/27/15 0555 10/28/15 0530  Weight: 109 kg (240 lb 4.8 oz) 110.1 kg (242 lb 11.6 oz) 119.57 kg (263 lb 9.7 oz)    History of present illness:  Chief Complaint: Lethargy  HPI: [name] is a 65 y.o. male with PMH of severe MR, chronic lower extremity DVT on Xarelto, chronic diastolic CHF, and chronic kidney disease stage III who was brought in by EMS from his ALF for evaluation of lethargy first noted at 7 AM on day of admission, from from [facility]F where he is said to be disoriented, incontinent, and drooling at baseline with proclivity for aggressive behavior, but was noted early in the day of admission to be unusually lethargic. He is essentially wheelchair bound during the days but is typically alert. This morning he was noted to be slumped over in his wheelchair, and while he was arousable, lethargy persisted throughout the day, prompting activation of EMS  Hospital Course:  1. AKI on CKD 3 - SCr 2.35 on admission, up from baseline of ~1.3, 1.4 - Suspect prerenal azotemia in setting of acute viral URI and dehydration and ACE, diuretics - Stopped lisinopril, held lasix - much improved, almost at  baseline, resume lasix at lower dose at discharge -  instead of   2. Viral URI -improved - Supportive care, IVF   3. Normocytic anemia - Hgb 12.1 on admission, up from baseline of 10-11  - stable  4. 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 Hypertension - Initial BP in ED was 89/64, improved with IVF  - stable, improved now, ACE stopped  6. Chronic diastolic CHF  - TTE (07/22/2011) EF 55-60% with grade 1 diastolic dysfunction  - compensated, resume lasix today  7. Hyperkalemia  - Serum K+ 5.5 on admission, likely secondary to AKI, ACE - No EKG changes  -resolved, ACE stopped  8. 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 Qty: 30 tablet, Refills: 0      CONTINUE these medications which have NOT CHANGED   Details  ARIPiprazole (ABILIFY) 30 MG tablet Take 30 mg by mouth daily.    Cholecalciferol (VITAMIN D3) 1000 UNITS CAPS Take 1 capsule by mouth 2 (two) times daily.      donepezil (ARICEPT) 10 MG tablet Take 10 mg by mouth at bedtime.     ferrous sulfate 325 (65 FE) MG tablet Take 325 mg by mouth daily with breakfast.      gabapentin (NEURONTIN) 100 MG capsule Take 100 mg by mouth at bedtime.      memantine (NAMENDA) 10 MG  tablet Take 10 mg by mouth 2 (two) times daily.      potassium chloride (KLOR-CON) 10 MEQ CR tablet Take 10 mEq by mouth daily.      !! risperiDONE (RISPERDAL) 1 MG tablet Take 1 mg by mouth at bedtime. Reported on 10/25/2015    !! risperiDONE (RISPERDAL) 2 MG tablet Take 2 mg by mouth daily with breakfast.    rivaroxaban (XARELTO) 20 MG TABS tablet Take 1 tablet (20 mg total) by mouth daily with supper. Qty: 30 tablet    LORazepam (ATIVAN) 0.5 MG tablet Take 1 tablet (0.5 mg total) by mouth every 4 (four) hours as needed (for agitation). Qty: 1 tablet, Refills: 0     !! - Potential duplicate medications found. Please discuss with provider.    STOP taking these medications     lisinopril (PRINIVIL,ZESTRIL) 10 MG tablet        No Known Allergies    The results of significant diagnostics from this hospitalization (including imaging, microbiology, ancillary and laboratory) are listed below for reference.    Significant Diagnostic Studies: Dg Chest 2 View  10/25/2015  CLINICAL DATA:  Slumped over, changes behavior EXAM: CHEST  2 VIEW COMPARISON:  06/18/2014 FINDINGS: There is elevation of the right diaphragm. There is mild bilateral interstitial thickening. There is no focal parenchymal opacity. There is no pleural effusion or pneumothorax. There is cardiomegaly. The osseous structures are unremarkable. IMPRESSION: Cardiomegaly with mild pulmonary vascular congestion. Electronically Signed   By: Elige Ko   On: 10/25/2015 18:36   Ct Head Wo Contrast  10/25/2015  CLINICAL DATA:  Slumped over, change in behavior EXAM: CT HEAD WITHOUT CONTRAST TECHNIQUE: Contiguous axial images were obtained from the base of the skull through the vertex without intravenous contrast. COMPARISON:  02/10/2011 FINDINGS: There is no evidence of mass effect, midline shift, or extra-axial fluid collections. There is no evidence of a space-occupying lesion or intracranial hemorrhage. There is no evidence of a  cortical-based area of acute infarction. The ventricles and sulci are appropriate for the patient's age. The basal cisterns are patent. Visualized portions of the orbits are unremarkable. The visualized portions of the paranasal sinuses and mastoid air cells are unremarkable. The osseous structures are unremarkable. IMPRESSION: Normal CT of the brain without intravenous contrast. Electronically Signed   By: Elige Ko   On: 10/25/2015 18:39    Microbiology: Recent Results (from the past 240 hour(s))  Culture, Urine     Status: None   Collection Time: 10/25/15 10:33 PM  Result Value Ref Range Status   Specimen Description URINE, RANDOM  Final   Special Requests NONE  Final   Culture NO GROWTH 2 DAYS  Final   Report Status 10/27/2015 FINAL  Final  Labs: Basic Metabolic Panel:  Recent Labs Lab 10/25/15 1744 10/25/15 2033 10/26/15 0233 10/27/15 0616  NA 139 139 142 139  K 5.5* 4.8 5.4* 4.4  CL 103 104 105 106  CO2 GLUCOSE 90 83 45* 83  BUN 37* 36* 36* 22*  CREATININE 2.35* 2.07* 2.06* 1.58*  CALCIUM 9.8 9.3 9.5 9.2   Liver Function Tests:  Recent Labs Lab 10/25/15 1744  AST 46*  ALT 31  ALKPHOS 88  BILITOT 0.3  PROT 7.7  ALBUMIN 3.5   No results for input(s): LIPASE, AMYLASE in the last 168 hours. No results for input(s): AMMONIA in the last 168 hours. CBC:  Recent Labs Lab 10/25/15 1744 10/27/15 0616  WBC 8.4 7.6  NEUTROABS 5.0  --   HGB 12.1* 10.6*  HCT 35.7* 32.6*  MCV 88.4 88.8  PLT 230 203   Cardiac Enzymes:  Recent Labs Lab 10/25/15 2033 10/26/15 0233 10/26/15 0740  TROPONINI <0.03 <0.03 <0.03   BNP: BNP (last 3 results)  Recent Labs  10/25/15 2033  BNP 8.3    ProBNP (last 3 results) No results for input(s): PROBNP in the last 8760 hours.  CBG: No results for input(s): GLUCAP in the last 168 hours.     SignedZannie Cove MD.  [facility] 10/28/2015, 10:59 AM

## 2015-10-28 NOTE — Progress Notes (Signed)
CSW has contacted the facility twice to work towards d/c'ing Pt to  Facility today.   CSW is awaiting final confirmation to d/c to facility.     Leron Croak LCSWA  [facility] [phone]

## 2015-10-28 NOTE — NC FL2 (Signed)
[place] MEDICAID FL2 LEVEL OF CARE SCREENING TOOL     IDENTIFICATION  Patient Name: [name] Birthdate: [birth year] Sex: male Admission Date (Current Location): 10/25/2015  [facility] and [place] Number:  Producer, television/film/video and Address:  [facility], [address] [place], [place] [address]      Provider Number: [id number]  Attending Physician Name and Address:  Zannie Cove, MD  Relative Name and Phone Number:       Current Level of Care: Hospital Recommended Level of Care: Assisted Living Facility Prior Approval Number:    Date Approved/Denied:   PASRR Number:    Discharge Plan: Other (Comment) (ALF   [facility] )    Current Diagnoses: Patient Active Problem List   Diagnosis Date Noted  . Dehydration 10/25/2015  . Acute kidney injury (HCC) 10/25/2015  . Viral URI 10/25/2015  . AKI (acute kidney injury) (HCC) 10/25/2015  . Conjunctivitis 10/25/2015  . DVT (deep venous thrombosis) (HCC) 06/11/2015  . CKD (chronic kidney disease) stage 3, GFR 30-59 ml/min 06/11/2015  . Mental retardation 06/11/2015  . DVT of leg (deep venous thrombosis) (HCC)   . Anemia 06/19/2014  . Dementia 06/19/2014  . Other and unspecified hyperlipidemia 06/19/2014  . Leg edema 07/09/2011  . Hypertension     Orientation RESPIRATION BLADDER Height & Weight    Self  Normal Incontinent   263 lbs.  BEHAVIORAL SYMPTOMS/MOOD NEUROLOGICAL BOWEL NUTRITION STATUS      Incontinent Diet (Low sodium heart healthy )  AMBULATORY STATUS COMMUNICATION OF NEEDS Skin   Extensive Assist Verbally Normal                       Personal Care Assistance Level of Assistance  Total care       Total Care Assistance: Maximum assistance   Functional Limitations Info             SPECIAL CARE FACTORS FREQUENCY                       Contractures Contractures Info: Not present    Additional Factors Info                  Current Medications  (10/28/2015):  This is the current hospital active medication list Current Facility-Administered Medications  Medication Dose Route Frequency Provider Last Rate Last Dose  . acetaminophen (TYLENOL) tablet 650 mg  650 mg Oral Q4H PRN Lavone Neri Opyd, MD      . ARIPiprazole (ABILIFY) tablet 30 mg  30 mg Oral Daily Briscoe Deutscher, MD   30 mg at 10/28/15 1034  . cholecalciferol (VITAMIN D) tablet 1,000 Units  1,000 Units Oral BID Briscoe Deutscher, MD   1,000 Units at 10/28/15 1033  . donepezil (ARICEPT) tablet 10 mg  10 mg Oral QHS Briscoe Deutscher, MD   10 mg at 10/27/15 2159  . erythromycin ophthalmic ointment   Right Eye 3 times per day Briscoe Deutscher, MD      . ferrous sulfate tablet 325 mg  325 mg Oral Q breakfast Briscoe Deutscher, MD   325 mg at 10/28/15 1033  . gabapentin (NEURONTIN) capsule 100 mg  100 mg Oral QHS Lavone Neri Opyd, MD   100 mg at 10/27/15 2200  . LORazepam (ATIVAN) tablet 0.5 mg  0.5 mg Oral Q4H PRN Briscoe Deutscher, MD   0.5 mg at 10/27/15 2200  . memantine (NAMENDA)  tablet 10 mg  10 mg Oral BID Briscoe Deutscher, MD   10 mg at 10/28/15 1034  . ondansetron (ZOFRAN) injection 4 mg  4 mg Intravenous Q6H PRN Briscoe Deutscher, MD      . risperiDONE (RISPERDAL) tablet 1 mg  1 mg Oral QHS Briscoe Deutscher, MD   1 mg at 10/27/15 2159  . risperiDONE (RISPERDAL) tablet 2 mg  2 mg Oral Q breakfast Briscoe Deutscher, MD   2 mg at 10/28/15 1033  . rivaroxaban (XARELTO) tablet 20 mg  20 mg Oral Q supper Herby Abraham, RPH   20 mg at 10/27/15 1714     Discharge Medications: Please see discharge summary for a list of discharge medications.  Relevant Imaging Results:  Relevant Lab Results:   Additional Information SSN#   [id number]  Leron Croak

## 2015-10-28 NOTE — Progress Notes (Signed)
Report called to nurse at [facility] care.

## 2015-12-09 ENCOUNTER — Emergency Department (HOSPITAL_COMMUNITY): Payer: Medicare Other

## 2015-12-09 ENCOUNTER — Encounter (HOSPITAL_COMMUNITY): Payer: Self-pay | Admitting: *Deleted

## 2015-12-09 ENCOUNTER — Observation Stay (HOSPITAL_COMMUNITY)
Admission: EM | Admit: 2015-12-09 | Discharge: 2015-12-12 | Disposition: A | Discharge status: Home / Self Care | Payer: Medicare Other | Attending: Internal Medicine | Admitting: Internal Medicine

## 2015-12-09 DIAGNOSIS — E669 Obesity, unspecified: Secondary | ICD-10-CM | POA: Insufficient documentation

## 2015-12-09 DIAGNOSIS — I5032 Chronic diastolic (congestive) heart failure: Secondary | ICD-10-CM | POA: Diagnosis present

## 2015-12-09 DIAGNOSIS — Z6836 Body mass index (BMI) 36.0-36.9, adult: Secondary | ICD-10-CM | POA: Diagnosis not present

## 2015-12-09 DIAGNOSIS — F0390 Unspecified dementia without behavioral disturbance: Secondary | ICD-10-CM | POA: Diagnosis not present

## 2015-12-09 DIAGNOSIS — I129 Hypertensive chronic kidney disease with stage 1 through stage 4 chronic kidney disease, or unspecified chronic kidney disease: Secondary | ICD-10-CM | POA: Diagnosis not present

## 2015-12-09 DIAGNOSIS — Z23 Encounter for immunization: Secondary | ICD-10-CM | POA: Diagnosis not present

## 2015-12-09 DIAGNOSIS — I10 Essential (primary) hypertension: Secondary | ICD-10-CM | POA: Diagnosis not present

## 2015-12-09 DIAGNOSIS — Z7901 Long term (current) use of anticoagulants: Secondary | ICD-10-CM | POA: Insufficient documentation

## 2015-12-09 DIAGNOSIS — I350 Nonrheumatic aortic (valve) stenosis: Secondary | ICD-10-CM | POA: Insufficient documentation

## 2015-12-09 DIAGNOSIS — E785 Hyperlipidemia, unspecified: Secondary | ICD-10-CM | POA: Diagnosis not present

## 2015-12-09 DIAGNOSIS — F79 Unspecified intellectual disabilities: Secondary | ICD-10-CM | POA: Insufficient documentation

## 2015-12-09 DIAGNOSIS — N183 Chronic kidney disease, stage 3 unspecified: Secondary | ICD-10-CM | POA: Diagnosis present

## 2015-12-09 DIAGNOSIS — D649 Anemia, unspecified: Secondary | ICD-10-CM | POA: Diagnosis not present

## 2015-12-09 DIAGNOSIS — R627 Adult failure to thrive: Secondary | ICD-10-CM | POA: Diagnosis not present

## 2015-12-09 DIAGNOSIS — Z86718 Personal history of other venous thrombosis and embolism: Secondary | ICD-10-CM | POA: Diagnosis not present

## 2015-12-09 DIAGNOSIS — X31XXXA Exposure to excessive natural cold, initial encounter: Secondary | ICD-10-CM | POA: Insufficient documentation

## 2015-12-09 DIAGNOSIS — T68XXXA Hypothermia, initial encounter: Secondary | ICD-10-CM | POA: Insufficient documentation

## 2015-12-09 DIAGNOSIS — N289 Disorder of kidney and ureter, unspecified: Secondary | ICD-10-CM

## 2015-12-09 DIAGNOSIS — M7989 Other specified soft tissue disorders: Secondary | ICD-10-CM | POA: Insufficient documentation

## 2015-12-09 LAB — CBC WITH DIFFERENTIAL/PLATELET
BASOS PCT: 0 %
Basophils Absolute: 0 10*3/uL (ref 0.0–0.1)
EOS ABS: 0.1 10*3/uL (ref 0.0–0.7)
EOS PCT: 1 %
HCT: 32.5 % — ABNORMAL LOW (ref 39.0–52.0)
HEMOGLOBIN: 10.9 g/dL — AB (ref 13.0–17.0)
Lymphocytes Relative: 24 %
Lymphs Abs: 1.2 10*3/uL (ref 0.7–4.0)
MCH: 29.9 pg (ref 26.0–34.0)
MCHC: 33.5 g/dL (ref 30.0–36.0)
MCV: 89 fL (ref 78.0–100.0)
Monocytes Absolute: 0.5 10*3/uL (ref 0.1–1.0)
Monocytes Relative: 9 %
NEUTROS PCT: 66 %
Neutro Abs: 3.3 10*3/uL (ref 1.7–7.7)
PLATELETS: 139 10*3/uL — AB (ref 150–400)
RBC: 3.65 MIL/uL — AB (ref 4.22–5.81)
RDW: 16.7 % — ABNORMAL HIGH (ref 11.5–15.5)
WBC: 5.1 10*3/uL (ref 4.0–10.5)

## 2015-12-09 LAB — COMPREHENSIVE METABOLIC PANEL
ALK PHOS: 85 U/L (ref 38–126)
ALT: 51 U/L (ref 17–63)
AST: 59 U/L — AB (ref 15–41)
Albumin: 3.4 g/dL — ABNORMAL LOW (ref 3.5–5.0)
Anion gap: 9 (ref 5–15)
BUN: 26 mg/dL — AB (ref 6–20)
CO2: 26 mmol/L (ref 22–32)
CREATININE: 1.87 mg/dL — AB (ref 0.61–1.24)
Calcium: 9.8 mg/dL (ref 8.9–10.3)
Chloride: 107 mmol/L (ref 101–111)
GFR, EST AFRICAN AMERICAN: 43 mL/min — AB (ref >60)
GFR, EST NON AFRICAN AMERICAN: 37 mL/min — AB (ref >60)
Glucose, Bld: 65 mg/dL (ref 65–99)
Potassium: 5 mmol/L (ref 3.5–5.1)
Sodium: 142 mmol/L (ref 135–145)
Total Bilirubin: 0.4 mg/dL (ref 0.3–1.2)
Total Protein: 7.1 g/dL (ref 6.5–8.1)

## 2015-12-09 LAB — IRON AND TIBC
IRON: 38 ug/dL — AB (ref 45–182)
Saturation Ratios: 14 % — ABNORMAL LOW (ref 17.9–39.5)
TIBC: 273 ug/dL (ref 250–450)
UIBC: 235 ug/dL

## 2015-12-09 LAB — LACTIC ACID, PLASMA
LACTIC ACID, VENOUS: 1 mmol/L (ref 0.5–2.0)
LACTIC ACID, VENOUS: 1.1 mmol/L (ref 0.5–2.0)

## 2015-12-09 LAB — TSH
TSH: 3.847 u[IU]/mL (ref 0.350–4.500)
TSH: 3.529 u[IU]/mL (ref 0.350–4.500)

## 2015-12-09 LAB — FERRITIN: FERRITIN: 252 ng/mL (ref 24–336)

## 2015-12-09 LAB — URINALYSIS, ROUTINE W REFLEX MICROSCOPIC
BILIRUBIN URINE: NEGATIVE
Glucose, UA: NEGATIVE mg/dL
HGB URINE DIPSTICK: NEGATIVE
Ketones, ur: NEGATIVE mg/dL
Leukocytes, UA: NEGATIVE
NITRITE: NEGATIVE
PROTEIN: NEGATIVE mg/dL
SPECIFIC GRAVITY, URINE: 1.014 (ref 1.005–1.030)
pH: 5.5 (ref 5.0–8.0)

## 2015-12-09 LAB — CORTISOL: CORTISOL PLASMA: 18.4 ug/dL

## 2015-12-09 LAB — RETICULOCYTES
RBC.: 3.82 MIL/uL — AB (ref 4.22–5.81)
RETIC COUNT ABSOLUTE: 42 10*3/uL (ref 19.0–186.0)
RETIC CT PCT: 1.1 % (ref 0.4–3.1)

## 2015-12-09 LAB — FOLATE: Folate: 12.2 ng/mL (ref >5.9)

## 2015-12-09 LAB — INFLUENZA PANEL BY PCR (TYPE A & B)
H1N1FLUPCR: NOT DETECTED
Influenza A By PCR: NEGATIVE
Influenza B By PCR: NEGATIVE

## 2015-12-09 LAB — AMMONIA: Ammonia: 32 umol/L (ref 9–35)

## 2015-12-09 LAB — MRSA PCR SCREENING: MRSA BY PCR: NEGATIVE

## 2015-12-09 LAB — VITAMIN B12: Vitamin B-12: 468 pg/mL (ref 180–914)

## 2015-12-09 MED ORDER — DONEPEZIL HCL 10 MG PO TABS
10.0000 mg | ORAL_TABLET | Freq: Every day | ORAL | Status: DC
  Administered 2015-12-09 – 2015-12-11 (×3): 10 mg via ORAL
  Filled 2015-12-09 (×4): qty 1

## 2015-12-09 MED ORDER — VITAMIN D 1000 UNITS PO TABS
1000.0000 [IU] | ORAL_TABLET | Freq: Two times a day (BID) | ORAL | Status: DC
  Administered 2015-12-09 – 2015-12-12 (×6): 1000 [IU] via ORAL
  Filled 2015-12-09 (×6): qty 1

## 2015-12-09 MED ORDER — RISPERIDONE 2 MG PO TABS
2.0000 mg | ORAL_TABLET | Freq: Every day | ORAL | Status: DC
  Administered 2015-12-10 – 2015-12-12 (×3): 2 mg via ORAL
  Filled 2015-12-09 (×5): qty 1

## 2015-12-09 MED ORDER — ARIPIPRAZOLE 10 MG PO TABS
30.0000 mg | ORAL_TABLET | Freq: Every day | ORAL | Status: DC
  Administered 2015-12-10 – 2015-12-12 (×3): 30 mg via ORAL
  Filled 2015-12-09 (×3): qty 3

## 2015-12-09 MED ORDER — FERROUS SULFATE 325 (65 FE) MG PO TABS: 325.0000 mg | ORAL_TABLET | Freq: Every day | ORAL | Status: DC

## 2015-12-09 MED ORDER — RISPERIDONE 1 MG PO TABS
1.0000 mg | ORAL_TABLET | Freq: Every day | ORAL | Status: DC
  Administered 2015-12-09 – 2015-12-11 (×3): 1 mg via ORAL
  Filled 2015-12-09 (×4): qty 1

## 2015-12-09 MED ORDER — OMEGA-3-ACID ETHYL ESTERS 1 G PO CAPS
1.0000 g | ORAL_CAPSULE | Freq: Every day | ORAL | Status: DC
  Administered 2015-12-09 – 2015-12-12 (×4): 1 g via ORAL
  Filled 2015-12-09 (×4): qty 1

## 2015-12-09 MED ORDER — FERROUS SULFATE 325 (65 FE) MG PO TABS
325.0000 mg | ORAL_TABLET | Freq: Two times a day (BID) | ORAL | Status: DC
  Administered 2015-12-09 – 2015-12-12 (×7): 325 mg via ORAL
  Filled 2015-12-09 (×7): qty 1

## 2015-12-09 MED ORDER — MEMANTINE HCL 10 MG PO TABS
10.0000 mg | ORAL_TABLET | Freq: Two times a day (BID) | ORAL | Status: DC
  Administered 2015-12-09 – 2015-12-12 (×6): 10 mg via ORAL
  Filled 2015-12-09 (×9): qty 1

## 2015-12-09 MED ORDER — GABAPENTIN 100 MG PO CAPS
100.0000 mg | ORAL_CAPSULE | Freq: Every day | ORAL | Status: DC
  Administered 2015-12-09 – 2015-12-11 (×3): 100 mg via ORAL
  Filled 2015-12-09 (×3): qty 1

## 2015-12-09 MED ORDER — ACETAMINOPHEN 325 MG PO TABS
650.0000 mg | ORAL_TABLET | Freq: Four times a day (QID) | ORAL | Status: DC | PRN
  Administered 2015-12-10 – 2015-12-12 (×2): 650 mg via ORAL
  Filled 2015-12-09 (×2): qty 2

## 2015-12-09 MED ORDER — LORAZEPAM 0.5 MG PO TABS
0.5000 mg | ORAL_TABLET | ORAL | Status: DC | PRN
  Administered 2015-12-10 – 2015-12-12 (×5): 0.5 mg via ORAL
  Filled 2015-12-09 (×6): qty 1

## 2015-12-09 MED ORDER — RIVAROXABAN 20 MG PO TABS
20.0000 mg | ORAL_TABLET | Freq: Every day | ORAL | Status: DC
  Administered 2015-12-09 – 2015-12-12 (×4): 20 mg via ORAL
  Filled 2015-12-09 (×4): qty 1

## 2015-12-09 MED ORDER — SODIUM CHLORIDE 0.9 % IV SOLN
INTRAVENOUS | Status: DC
Start: 2015-12-09 — End: 2015-12-09
  Administered 2015-12-09: 16:00:00 via INTRAVENOUS

## 2015-12-09 MED ORDER — DOCUSATE SODIUM 100 MG PO CAPS
100.0000 mg | ORAL_CAPSULE | Freq: Two times a day (BID) | ORAL | Status: DC
  Administered 2015-12-09 – 2015-12-12 (×6): 100 mg via ORAL
  Filled 2015-12-09 (×6): qty 1

## 2015-12-09 MED ORDER — ACETAMINOPHEN 650 MG RE SUPP: 650.0000 mg | Freq: Four times a day (QID) | RECTAL | Status: DC | PRN

## 2015-12-09 NOTE — ED Notes (Signed)
Patient transported to CT 

## 2015-12-09 NOTE — ED Notes (Signed)
Pt's dinner tray at bedside 

## 2015-12-09 NOTE — ED Provider Notes (Signed)
CSN: [id number]     Arrival date & time 12/09/15  1020 History   First MD Initiated Contact with Patient 12/09/15 1036     Chief Complaint  Patient presents with  . Failure To Thrive     (Consider location/radiation/quality/duration/timing/severity/associated sxs/prior Treatment) The history is provided by the patient and medical records. No language interpreter was used.   [name] is a 65 y.o. male  with a PMH of MR, dementia, HTN who presents to the Emergency Department from [facility] via EMS. Per nursing staff and EMS report, main concern of facility is that he is no longer doing his usual ADL's such as feeding himself. EMS unable to gain more insight into reason for visit other than he "hasn't been acting right" Patient with no complaints at this time, however level 5 caveat applies due to mental status of patient.    Past Medical History  Diagnosis Date  . Mental retardation   . DVT (deep venous thrombosis) (HCC)     right leg  . Dyslipidemia   . Obesity   . Hypertension   . Dementia 06/19/2014  . Other and unspecified hyperlipidemia 06/19/2014   History reviewed. No pertinent past surgical history. Family History  Problem Relation Age of Onset  . Hypertension     Social History  Substance Use Topics  . Smoking status: Never Smoker   . Smokeless tobacco: Never Used  . Alcohol Use: No    Review of Systems  Unable to perform ROS: Acuity of condition      Allergies  Review of patient's allergies indicates no known allergies.  Home Medications   Prior to Admission medications   Medication Sig Start Date End Date Taking? Authorizing Provider  ARIPiprazole (ABILIFY) 30 MG tablet Take 30 mg by mouth daily.   Yes Historical Provider, MD  Cholecalciferol (VITAMIN D3) 1000 UNITS CAPS Take 1 capsule by mouth 2 (two) times daily.     Yes Historical Provider, MD  donepezil (ARICEPT) 10 MG tablet Take 10 mg by mouth at bedtime.    Yes Historical Provider, MD  ferrous  sulfate 325 (65 FE) MG tablet Take 325 mg by mouth daily with breakfast.     Yes Historical Provider, MD  furosemide (LASIX) 20 MG tablet Take 1 tablet (20 mg total) by mouth daily. 10/28/15  Yes Zannie Cove, MD  gabapentin (NEURONTIN) 100 MG capsule Take 100 mg by mouth at bedtime.     Yes Historical Provider, MD  LORazepam (ATIVAN) 0.5 MG tablet Take 1 tablet (0.5 mg total) by mouth every 4 (four) hours as needed (for agitation). 06/13/15  Yes Penny Pia, MD  memantine (NAMENDA) 10 MG tablet Take 10 mg by mouth 2 (two) times daily.     Yes Historical Provider, MD  Omega-3 Fatty Acids (FISH OIL) 1200 MG CAPS Take 1,200 mg by mouth daily.   Yes Historical Provider, MD  potassium chloride (KLOR-CON) 10 MEQ CR tablet Take 10 mEq by mouth daily.     Yes Historical Provider, MD  risperiDONE (RISPERDAL) 1 MG tablet Take 1 mg by mouth at bedtime. Reported on 10/25/2015   Yes Historical Provider, MD  risperiDONE (RISPERDAL) 2 MG tablet Take 2 mg by mouth daily with breakfast.   Yes Historical Provider, MD  rivaroxaban (XARELTO) 20 MG TABS tablet Take 1 tablet (20 mg total) by mouth daily with supper. 06/22/15  Yes Penny Pia, MD   BP 109/77 mmHg  Pulse 72  Temp(Src) 97.9 F (36.6 C) (Rectal)  SpO2 96% Physical Exam  Constitutional: He appears well-developed and well-nourished.  Alert and in no acute distress  HENT:  Head: Normocephalic and atraumatic.  Cardiovascular: Normal rate, regular rhythm and normal heart sounds.  Exam reveals no gallop and no friction rub.   No murmur heard. Pulmonary/Chest: Effort normal and breath sounds normal. No respiratory distress. He has no wheezes. He has no rales. He exhibits no tenderness.  Abdominal: Soft. Bowel sounds are normal. He exhibits no distension. There is no tenderness.  Musculoskeletal: Normal range of motion.  Neurological:  Alert and oriented to self. Able to follow simple commands. 5/5 muscle strength. CN 2-12 grossly intact.   Psychiatric:  He has a normal mood and affect. His behavior is normal. Judgment and thought content normal.  Nursing note and vitals reviewed.   ED Course  Procedures (including critical care time) Labs Review Labs Reviewed  COMPREHENSIVE METABOLIC PANEL - Abnormal; Notable for the following:    BUN 26 (*)    Creatinine, Ser 1.87 (*)    Albumin 3.4 (*)    AST 59 (*)    GFR calc non Af Amer 37 (*)    GFR calc Af Amer 43 (*)    All other components within normal limits  CBC WITH DIFFERENTIAL/PLATELET - Abnormal; Notable for the following:    RBC 3.65 (*)    Hemoglobin 10.9 (*)    HCT 32.5 (*)    RDW 16.7 (*)    Platelets 139 (*)    All other components within normal limits  URINALYSIS, ROUTINE W REFLEX MICROSCOPIC (NOT AT [facility]) - Abnormal; Notable for the following:    APPearance HAZY (*)    All other components within normal limits  TSH  CORTISOL  VITAMIN B12  FOLATE  IRON AND TIBC  FERRITIN  RETICULOCYTES  TSH  AMMONIA    Imaging Review Dg Chest 2 View  12/09/2015  CLINICAL DATA:  Hypothermia, not acting right, no longer doing self care EXAM: CHEST  2 VIEW COMPARISON:  10/25/2015 FINDINGS: Cardiomediastinal silhouette is stable. There is poor inspiration. Persistent elevation of the right hemidiaphragm. Stable right basilar atelectasis or scarring and chronic blunting of the right costophrenic angle. No segmental infiltrate or pulmonary edema. IMPRESSION: Persistent elevation of the right hemidiaphragm. Stable right basilar atelectasis or scarring and chronic blunting of the right costophrenic angle. No segmental infiltrate or pulmonary edema. Electronically Signed   By: Natasha Mead M.D.   On: 12/09/2015 14:58   Ct Head Wo Contrast  12/09/2015  CLINICAL DATA:  Failure to thrive, unable to obtain further history. EXAM: CT HEAD WITHOUT CONTRAST TECHNIQUE: Contiguous axial images were obtained from the base of the skull through the vertex without intravenous contrast. COMPARISON:  10/25/2015  FINDINGS: There is no evidence of mass effect, midline shift or extra-axial fluid collections. There is no evidence of a space-occupying lesion or intracranial hemorrhage. There is no evidence of a cortical-based area of acute infarction. The ventricles and sulci are appropriate for the patient's age. The basal cisterns are patent. Visualized portions of the orbits are unremarkable. The visualized portions of the paranasal sinuses and mastoid air cells are unremarkable. The osseous structures are unremarkable. IMPRESSION: 1. No acute intracranial pathology. Electronically Signed   By: Elige Ko   On: 12/09/2015 13:16   I have personally reviewed and evaluated these images and lab results as part of my medical decision-making.   EKG Interpretation None      MDM   Final diagnoses:  Hypothermia  Renal insufficiency  Anemia, unspecified anemia type   Asiah Befort presents from [facility] facility for not performing his usual ADL's. Patient has hx of MR and dementia and unable to provide insight into his medical condition or events leading to arrival. Upon arrival, rectal temp was 95.7. Bear hugger provided, and temp improved to 97.5. CT head unremarkable. Labs appear at baseline. UA with no signs of infection. Given hypothermia, hospitalist was consulted who will admit for further observation.   Patient discussed with Dr. Verdie Mosher who agrees with treatment plan.   [facility] Drystan Reader, PA-C 12/09/15 1522  Lavera Guise, MD 12/09/15 [phone]

## 2015-12-09 NOTE — ED Notes (Signed)
Pt placed on bear hugger.  
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
## 2015-12-09 NOTE — ED Notes (Signed)
Pt arrives from [facility] via [place]. Facility reports to EMS that 3 days ago the pt stopped feeding himself and "hasn't been acting right". According to EMS the facility was unable to elaborate and was mainly concerned with the pt no longer doing self care tasks like feeding self. Pt has no complaints upon arrival. Pt has a hx of MR and is difficult to communicate with at baseline per facility.

## 2015-12-09 NOTE — ED Notes (Signed)
Dr. Shelly Flatten at bedside.

## 2015-12-10 ENCOUNTER — Inpatient Hospital Stay (HOSPITAL_COMMUNITY): Payer: Medicare Other

## 2015-12-10 ENCOUNTER — Inpatient Hospital Stay (HOSPITAL_BASED_OUTPATIENT_CLINIC_OR_DEPARTMENT_OTHER): Payer: Medicare Other

## 2015-12-10 DIAGNOSIS — N183 Chronic kidney disease, stage 3 (moderate): Secondary | ICD-10-CM

## 2015-12-10 DIAGNOSIS — T68XXXA Hypothermia, initial encounter: Secondary | ICD-10-CM | POA: Diagnosis not present

## 2015-12-10 DIAGNOSIS — Z86718 Personal history of other venous thrombosis and embolism: Secondary | ICD-10-CM

## 2015-12-10 DIAGNOSIS — F0390 Unspecified dementia without behavioral disturbance: Secondary | ICD-10-CM

## 2015-12-10 DIAGNOSIS — R627 Adult failure to thrive: Secondary | ICD-10-CM | POA: Diagnosis not present

## 2015-12-10 LAB — BASIC METABOLIC PANEL
ANION GAP: 9 (ref 5–15)
BUN: 26 mg/dL — ABNORMAL HIGH (ref 6–20)
CALCIUM: 9.6 mg/dL (ref 8.9–10.3)
CO2: 27 mmol/L (ref 22–32)
Chloride: 105 mmol/L (ref 101–111)
Creatinine, Ser: 2.08 mg/dL — ABNORMAL HIGH (ref 0.61–1.24)
GFR calc non Af Amer: 32 mL/min — ABNORMAL LOW (ref >60)
GFR, EST AFRICAN AMERICAN: 38 mL/min — AB (ref >60)
Glucose, Bld: 81 mg/dL (ref 65–99)
POTASSIUM: 4.9 mmol/L (ref 3.5–5.1)
Sodium: 141 mmol/L (ref 135–145)

## 2015-12-10 LAB — CBC
HCT: 32.9 % — ABNORMAL LOW (ref 39.0–52.0)
HEMOGLOBIN: 10.5 g/dL — AB (ref 13.0–17.0)
MCH: 28.2 pg (ref 26.0–34.0)
MCHC: 31.9 g/dL (ref 30.0–36.0)
MCV: 88.2 fL (ref 78.0–100.0)
Platelets: 148 10*3/uL — ABNORMAL LOW (ref 150–400)
RBC: 3.73 MIL/uL — AB (ref 4.22–5.81)
RDW: 16.6 % — ABNORMAL HIGH (ref 11.5–15.5)
WBC: 6.5 10*3/uL (ref 4.0–10.5)

## 2015-12-10 MED ORDER — PNEUMOCOCCAL VAC POLYVALENT 25 MCG/0.5ML IJ INJ
0.5000 mL | INJECTION | INTRAMUSCULAR | Status: AC
  Administered 2015-12-12: 0.5 mL via INTRAMUSCULAR
  Filled 2015-12-10: qty 0.5

## 2015-12-10 NOTE — Care Management Obs Status (Signed)
MEDICARE OBSERVATION STATUS NOTIFICATION   Patient Details  Name: [name] MRN: [id number] Date of Birth: [birth year]   Medicare Observation Status Notification Given:  Yes  Patient has DX of mental retardation and dementia . Status was observation , inpatient , then observation again. Called [place] , who stated she is guardian . Explained observation / inpatient status , read letter and explained . [name] voiced understanding . Left hard copy at patient's bedside .  Kingsley Plan, RN 12/10/2015, 2:18 PM

## 2015-12-10 NOTE — Progress Notes (Signed)
[facility] PROGRESS NOTE  [name] QQV:[id number] DOB: August 28, 1951 DOA: 12/09/2015 PCP: Colon Branch, MD  HPI/Brief narrative 65 year old male patient with known developmental disability and dementia, history of DVT on Xarelto, hypertension, anemia, chronic kidney disease stage III and dyslipidemia. Patient was sent from [facility] facility with reports that "he was not acting right".  Pt was admitted with concerns of failure to thrive  Assessment/Plan:  FTT in adult/Dementia/ Mental retardation -No obvious infectious or metabolic etiology to patient's presenting symptomatology and suspect he may require a higher level of care that is unable to be provided at the assisted living facility -Admitted to floor -Continue supportive care -PT/OT evaluation requested -Social worker was consulted; suspect patient may need to transition to skilled nursing level -TSH was normal -Ammonia normal   Hypothermia -Nursing staff and ER reported patient appeared to not be appropriately dressed for current cold weather -No obvious infectious etiology presenting symptomatology (chest x-ray negative, urinalysis negative) -Patient was coughing at bedside so as precaution we'll check influenza PCR and respiratory viral panel -Cortisol normal -Temperature now normal   Hypertension/left ventricular diastolic dysfunction grade 1/mild AS and mild AR -Current blood pressure suboptimal -Hold Lasix -Repeat echo   CKD stage 3, GFR 30-59 ml/min -Renal function stable and at baseline   Anemia -Hemoglobin appears to be stable and at baseline of around 10 -Follow CBC   HLD  -Continue omega-3 3 fatty acid   History of DVT  -Remote and has been on Xarelto -Right lower extremity below the knee was significantly edematous -RLE doppler neg for DVT  Code Status: Full Family Communication: Pt in rom Disposition Plan: D/c planning pending PT/OT  eval   Consultants:    Procedures:    Antibiotics: Anti-infectives    None      HPI/Subjective: Non-verbal this AM  Objective: Filed Vitals:   12/09/15 1832 12/09/15 1835 12/09/15 2002 12/10/15 0328  BP: 173/134 66/38 101/69 100/60  Pulse: 107 145 99 84  Temp: 98.2 F (36.8 C)  98 F (36.7 C) 98.4 F (36.9 C)  TempSrc: Oral  Oral Oral  Resp: SpO2: 98% 97% 98% 95%    Intake/Output Summary (Last 24 hours) at 12/10/15 1657 Last data filed at 12/10/15 0258  Gross per 24 hour  Intake 536.67 ml  Output      0 ml  Net 536.67 ml   There were no vitals filed for this visit.  Exam:   General:  Awake, in nad  Cardiovascular: regular, s1, s2  Respiratory: normal resp effort, no wheezing  Abdomen: soft,nondistended  Musculoskeletal: perfused, no clubbing   Data Reviewed: Basic Metabolic Panel:  Recent Labs Lab 12/09/15 1123 12/10/15 0557  NA 142 141  K 5.0 4.9  CL 107 105  CO2 26 27  GLUCOSE 65 81  BUN 26* 26*  CREATININE 1.87* 2.08*  CALCIUM 9.8 9.6   Liver Function Tests:  Recent Labs Lab 12/09/15 1123  AST 59*  ALT 51  ALKPHOS 85  BILITOT 0.4  PROT 7.1  ALBUMIN 3.4*   No results for input(s): LIPASE, AMYLASE in the last 168 hours.  Recent Labs Lab 12/09/15 1718  AMMONIA 32   CBC:  Recent Labs Lab 12/09/15 1123 12/10/15 0557  WBC 5.1 6.5  NEUTROABS 3.3  --   HGB 10.9* 10.5*  HCT 32.5* 32.9*  MCV 89.0 88.2  PLT 139* 148*   Cardiac Enzymes: No results for input(s): CKTOTAL, CKMB, CKMBINDEX, TROPONINI in the  last 168 hours. BNP (last 3 results)  Recent Labs  10/25/15 2033  BNP 8.3    ProBNP (last 3 results) No results for input(s): PROBNP in the last 8760 hours.  CBG: No results for input(s): GLUCAP in the last 168 hours.  Recent Results (from the past 240 hour(s))  Culture, blood (Routine X 2) w Reflex to ID Panel     Status: None (Preliminary result)   Collection Time: 12/09/15  6:00 PM  Result  Value Ref Range Status   Specimen Description BLOOD LEFT ANTECUBITAL  Final   Special Requests BOTTLES DRAWN AEROBIC AND ANAEROBIC 10CC  Final   Culture NO GROWTH < 24 HOURS  Final   Report Status PENDING  Incomplete  Culture, blood (Routine X 2) w Reflex to ID Panel     Status: None (Preliminary result)   Collection Time: 12/09/15  6:08 PM  Result Value Ref Range Status   Specimen Description BLOOD RIGHT HAND  Final   Special Requests BOTTLES DRAWN AEROBIC ONLY 5CC  Final   Culture NO GROWTH < 24 HOURS  Final   Report Status PENDING  Incomplete  MRSA PCR Screening     Status: None   Collection Time: 12/09/15  6:21 PM  Result Value Ref Range Status   MRSA by PCR NEGATIVE NEGATIVE Final    Comment:        The GeneXpert MRSA Assay (FDA approved for NASAL specimens only), is one component of a comprehensive MRSA colonization surveillance program. It is not intended to diagnose MRSA infection nor to guide or monitor treatment for MRSA infections.      Studies: Dg Chest 2 View  12/09/2015  CLINICAL DATA:  Hypothermia, not acting right, no longer doing self care EXAM: CHEST  2 VIEW COMPARISON:  10/25/2015 FINDINGS: Cardiomediastinal silhouette is stable. There is poor inspiration. Persistent elevation of the right hemidiaphragm. Stable right basilar atelectasis or scarring and chronic blunting of the right costophrenic angle. No segmental infiltrate or pulmonary edema. IMPRESSION: Persistent elevation of the right hemidiaphragm. Stable right basilar atelectasis or scarring and chronic blunting of the right costophrenic angle. No segmental infiltrate or pulmonary edema. Electronically Signed   By: Natasha Mead M.D.   On: 12/09/2015 14:58   Ct Head Wo Contrast  12/09/2015  CLINICAL DATA:  Failure to thrive, unable to obtain further history. EXAM: CT HEAD WITHOUT CONTRAST TECHNIQUE: Contiguous axial images were obtained from the base of the skull through the vertex without intravenous  contrast. COMPARISON:  10/25/2015 FINDINGS: There is no evidence of mass effect, midline shift or extra-axial fluid collections. There is no evidence of a space-occupying lesion or intracranial hemorrhage. There is no evidence of a cortical-based area of acute infarction. The ventricles and sulci are appropriate for the patient's age. The basal cisterns are patent. Visualized portions of the orbits are unremarkable. The visualized portions of the paranasal sinuses and mastoid air cells are unremarkable. The osseous structures are unremarkable. IMPRESSION: 1. No acute intracranial pathology. Electronically Signed   By: Elige Ko   On: 12/09/2015 13:16    Scheduled Meds: . ARIPiprazole  30 mg Oral Daily  . cholecalciferol  1,000 Units Oral BID  . docusate sodium  100 mg Oral BID  . donepezil  10 mg Oral QHS  . ferrous sulfate  325 mg Oral BID WC  . gabapentin  100 mg Oral QHS  . memantine  10 mg Oral BID  . omega-3 acid ethyl esters  1 g Oral Daily  . [  START ON 12/11/2015] pneumococcal 23 valent vaccine  0.5 mL Intramuscular Tomorrow-1000  . risperiDONE  1 mg Oral QHS  . risperiDONE  2 mg Oral Q breakfast  . rivaroxaban  20 mg Oral Q supper   Continuous Infusions:   Principal Problem:   FTT (failure to thrive) in adult Active Problems:   Hypertension   Anemia   Dementia   HLD (hyperlipidemia)   CKD (chronic kidney disease) stage 3, GFR 30-59 ml/min   Mental retardation   History of DVT (deep vein thrombosis)   Hypothermia   Left ventricular diastolic dysfunction, NYHA class 1   Mild aortic stenosis   Failure to thrive in adult   Absolute anemia    Joia Doyle K  [facility] Pager [phone]. If 7PM-7AM, please contact night-coverage at [email or web address], password [facility] 12/10/2015, 4:57 PM  LOS: 1 day

## 2015-12-10 NOTE — Evaluation (Signed)
Physical Therapy Evaluation Patient Details Name: [name] MRN: [id number] DOB: 11/12/1950 Today's Date: 12/10/2015   History of Present Illness  [name] is a 65 y.o. male with a Past Medical History of MR, HLD, DVT, Dementia, HTN who presents with hypothermia, and anorexia/FTT  Clinical Impression   Pt admitted with above diagnosis. Pt currently with functional limitations due to the deficits listed below (see PT Problem List).  Pt will benefit from skilled PT to increase their independence and safety with mobility to allow discharge to the venue listed below.       Follow Up Recommendations SNF    Equipment Recommendations  Other (comment) (TBD at next venue of care)    Recommendations for Other Services       Precautions / Restrictions Precautions Precautions: Fall Precaution Comments: Consider gowing up to work with him; noted he has difficulty managing his saliva      Mobility  Bed Mobility Overal bed mobility: Needs Assistance Bed Mobility: Supine to Sit;Sit to Supine     Supine to sit: Max assist Sit to supine: Max assist   General bed mobility comments: Willing to get up; Following one step commands with tactile cueing and increased time  Transfers Overall transfer level: Needs assistance Equipment used:  (Gait belt and bed pad) Transfers: Sit to/from Stand;Lateral/Scoot Transfers Sit to Stand: Total assist        Lateral/Scoot Transfers: Total assist General transfer comment: Indicating he's agreeable to standing; Difficulty sequencing and weight shifting; Able to unweigh hips briefly with knee block and use of gait belt, but did not clear the bed; opted for lateral scoot closer to [facility], knees blocked  Ambulation/Gait                Stairs            Wheelchair Mobility    Modified Rankin (Stroke Patients Only)       Balance Overall balance assessment: Needs assistance Sitting-balance support: Bilateral upper extremity  supported;Feet supported Sitting balance-Leahy Scale: Poor Sitting balance - Comments: tending to L lean Postural control: Left lateral lean;Posterior lean   Standing balance-Leahy Scale: Zero                               Pertinent Vitals/Pain Pain Assessment: No/denies pain    Home Living Family/patient expects to be discharged to:: Skilled nursing facility                      Prior Function Level of Independence: Needs assistance   Gait / Transfers Assistance Needed: Assistance for transfers; Not sure if he was ambulatory here recently; noted in PT note late 2015 he walked without an assistive device at that time (more than a year ago)  ADL's / Homemaking Assistance Needed: Assistance from ALF staff; incontinent, but able to stand with assist for changing        Hand Dominance        Extremity/Trunk Assessment   Upper Extremity Assessment: Generalized weakness;Difficult to assess due to impaired cognition (moved his arms volitionally, but not overhead when asked to during assessment; resistant to AAROM and PROM)           Lower Extremity Assessment: Generalized weakness;Difficult to assess due to impaired cognition (hesitant to fully extend bil LEs, L worse than R)      Cervical / Trunk Assessment: Kyphotic  Communication   Communication: Expressive difficulties  Cognition Arousal/Alertness: Awake/alert Behavior During Therapy: WFL for tasks assessed/performed Overall Cognitive Status: No family/caregiver present to determine baseline cognitive functioning                      General Comments      Exercises        Assessment/Plan    PT Assessment Patient needs continued PT services  PT Diagnosis Generalized weakness   PT Problem List Decreased strength;Decreased range of motion;Decreased activity tolerance  PT Treatment Interventions DME instruction;Gait training;Functional mobility training;Therapeutic  activities;Therapeutic exercise;Balance training;Patient/family education;Cognitive remediation   PT Goals (Current goals can be found in the Care Plan section) Acute Rehab PT Goals Patient Stated Goal: He repeated that he wanted his hat throughout session PT Goal Formulation: Patient unable to participate in goal setting Time For Goal Achievement: 12/24/15 Potential to Achieve Goals: Fair    Frequency Min 2X/week   Barriers to discharge        Co-evaluation               End of Session Equipment Utilized During Treatment: Gait belt Activity Tolerance: Patient tolerated treatment well Patient left: in bed;with call bell/phone within reach;with bed alarm set (bed in chair position) Nurse Communication: Mobility status;Need for lift equipment    Functional Assessment Tool Used: Clinical Judgement Functional Limitation: Mobility: Walking and moving around Mobility: Walking and Moving Around Current Status [phone]): At least 60 percent but less than 80 percent impaired, limited or restricted Mobility: Walking and Moving Around Goal Status [phone]): At least 1 percent but less than 20 percent impaired, limited or restricted    Time: 1308-6578 PT Time Calculation (min) (ACUTE ONLY): 21 min   Charges:   PT Evaluation $PT Eval Moderate Complexity: 1 Procedure     PT G Codes:   PT G-Codes **NOT FOR INPATIENT CLASS** Functional Assessment Tool Used: Clinical Judgement Functional Limitation: Mobility: Walking and moving around Mobility: Walking and Moving Around Current Status ([id number]): At least 60 percent but less than 80 percent impaired, limited or restricted Mobility: Walking and Moving Around Goal Status [phone]): At least 1 percent but less than 20 percent impaired, limited or restricted    Van Clines [facility] 12/10/2015, 4:49 PM  Van Clines, PT  Acute Rehabilitation Services Pager [phone] Office [phone]

## 2015-12-10 NOTE — Progress Notes (Signed)
RN spoke with RN at [facility].  Patient's current need for Total Care is a definite change from baseline.  Baseline includes being able to stand up and feed himself, all of which he is unable to do now.  CJ RN [phone]

## 2015-12-10 NOTE — Progress Notes (Signed)
VASCULAR LAB PRELIMINARY  PRELIMINARY  PRELIMINARY  PRELIMINARY  Right lower extremity venous duplex completed.    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 No baker's cyst.  Exam difficult due to patients condition and movement. Tech had patient nurse to assistance.   Dailen Mcclish, RVT, RDMS 12/10/2015, 1:30 PM

## 2015-12-10 NOTE — Discharge Instructions (Signed)

## 2015-12-11 ENCOUNTER — Observation Stay (HOSPITAL_COMMUNITY): Payer: Medicare Other

## 2015-12-11 ENCOUNTER — Observation Stay (HOSPITAL_BASED_OUTPATIENT_CLINIC_OR_DEPARTMENT_OTHER): Payer: Medicare Other

## 2015-12-11 DIAGNOSIS — E785 Hyperlipidemia, unspecified: Secondary | ICD-10-CM

## 2015-12-11 DIAGNOSIS — I10 Essential (primary) hypertension: Secondary | ICD-10-CM

## 2015-12-11 DIAGNOSIS — T68XXXA Hypothermia, initial encounter: Secondary | ICD-10-CM | POA: Diagnosis not present

## 2015-12-11 DIAGNOSIS — R627 Adult failure to thrive: Secondary | ICD-10-CM | POA: Diagnosis not present

## 2015-12-11 DIAGNOSIS — F0390 Unspecified dementia without behavioral disturbance: Secondary | ICD-10-CM | POA: Diagnosis not present

## 2015-12-11 DIAGNOSIS — F79 Unspecified intellectual disabilities: Secondary | ICD-10-CM

## 2015-12-11 LAB — BASIC METABOLIC PANEL
ANION GAP: 9 (ref 5–15)
BUN: 27 mg/dL — ABNORMAL HIGH (ref 6–20)
CHLORIDE: 105 mmol/L (ref 101–111)
CO2: 26 mmol/L (ref 22–32)
CREATININE: 1.97 mg/dL — AB (ref 0.61–1.24)
Calcium: 9.5 mg/dL (ref 8.9–10.3)
GFR calc non Af Amer: 35 mL/min — ABNORMAL LOW (ref >60)
GFR, EST AFRICAN AMERICAN: 40 mL/min — AB (ref >60)
Glucose, Bld: 78 mg/dL (ref 65–99)
Potassium: 5.1 mmol/L (ref 3.5–5.1)
SODIUM: 140 mmol/L (ref 135–145)

## 2015-12-11 LAB — RESPIRATORY VIRUS PANEL
Adenovirus: NEGATIVE
Influenza A: NEGATIVE
Influenza B: NEGATIVE
METAPNEUMOVIRUS: NEGATIVE
PARAINFLUENZA 2 A: NEGATIVE
PARAINFLUENZA 3 A: NEGATIVE
Parainfluenza 1: NEGATIVE
RESPIRATORY SYNCYTIAL VIRUS A: NEGATIVE
RHINOVIRUS: NEGATIVE
Respiratory Syncytial Virus B: NEGATIVE

## 2015-12-11 NOTE — Evaluation (Signed)
Occupational Therapy Evaluation Patient Details Name: [name] MRN: [id number] DOB: [birth year] Today's Date: 12/11/2015    History of Present Illness [name] is a 65 y.o. male with a Past Medical History of MR, HLD, DVT, Dementia, HTN who presents with hypothermia, and anorexia/FTT   Clinical Impression   Patient presenting with decreased ADL and functional mobility independence secondary to above. Patient supervision to mod I for sit to/from stands and overall max assist for ADLs PTA, according to chart. Patient currently functioning at an overall min for grooming tasks and total for rest of ADL, including self-feeding. Patient will benefit from acute OT to increase overall independence in the areas of ADLs, functional mobility, and overall safety in order to safely discharge to venue listed below.     Follow Up Recommendations  SNF;Supervision/Assistance - 24 hour    Equipment Recommendations  Other (comment) (TBD next venue of care)    Recommendations for Other Services  None at this time   Precautions / Restrictions Precautions Precautions: Fall Precaution Comments: Consider gowning up to work with him; noted he has difficulty managing his saliva Restrictions Weight Bearing Restrictions: No    Mobility Bed Mobility - See PT eval for more information General bed mobility comments: Pt resisting therapist, unable to asses bed mobility   Transfers - See PT eval for more information  General transfer comment: Pt resisting therapist, unable to asses bed transfers    Balance See PT eval for more information      ADL Overall ADL's : Needs assistance/impaired General ADL Comments: Limited evaluation secondary to patient resisting therapist when therapist attempted to have him sit EOB. Bed level evaluation completed. Pt overall min assist for grooming in supine and overall total assist for remainder of ADL.     Vision Vision Assessment?: Vision impaired- to be further  tested in functional context Additional Comments: Pt with difficulty seeing toothbrush to put toothpaste onto brush. Difficult to fully asses secondary to patient's decreased cognition. Will continue to further assess.           Pertinent Vitals/Pain Pain Assessment: No/denies pain Pain Score: 0-No pain Faces Pain Scale: No hurt     Hand Dominance Right (pt tended to use R hand when reaching for items)   Extremity/Trunk Assessment Upper Extremity Assessment Upper Extremity Assessment: Generalized weakness;Difficult to assess due to impaired cognition   Lower Extremity Assessment Lower Extremity Assessment: Defer to PT evaluation   Cervical / Trunk Assessment Cervical / Trunk Assessment: Kyphotic   Communication Communication Communication: Expressive difficulties   Cognition Arousal/Alertness: Awake/alert Behavior During Therapy: WFL for tasks assessed/performed Overall Cognitive Status: No family/caregiver present to determine baseline cognitive functioning             Home Living Family/patient expects to be discharged to:: Skilled nursing facility   Prior Functioning/Environment Level of Independence: Needs assistance  Gait / Transfers Assistance Needed: Assistance for transfers; Not sure if he was ambulatory here recently; noted in PT note late 2015 he walked without an assistive device at that time (more than a year ago) ADL's / Homemaking Assistance Needed: Assistance from ALF staff; incontinent, but able to stand with assist for changing        OT Diagnosis: Generalized weakness;Cognitive deficits   OT Problem List: Decreased strength;Decreased range of motion;Decreased activity tolerance;Impaired balance (sitting and/or standing);Decreased knowledge of use of DME or AE;Decreased safety awareness;Decreased knowledge of precautions;Impaired vision/perception;Decreased cognition;Decreased coordination   OT Treatment/Interventions: Self-care/ADL training;Therapeutic  exercise;Energy conservation;DME and/or AE instruction;Therapeutic  activities;Patient/family education;Balance training    OT Goals(Current goals can be found in the care plan section) Acute Rehab OT Goals Patient Stated Goal: He repeated that he wanted his hat throughout session OT Goal Formulation: Patient unable to participate in goal setting Time For Goal Achievement: 12/25/15 Potential to Achieve Goals: Fair ADL Goals Pt Will Perform Eating: with min assist;sitting (EOB) Pt Will Perform Grooming: with min assist;sitting (EOB) Pt Will Perform Upper Body Bathing: with min assist;sitting (EOB) Pt Will Perform Upper Body Dressing: with min assist;sitting (EOB) Pt Will Transfer to Toilet: with mod assist;stand pivot transfer;bedside commode  OT Frequency: Min 2X/week   Barriers to D/C: Decreased caregiver support   End of Session Activity Tolerance: Treatment limited secondary to agitation (when trying to engage pt in bed mobility, pt became somewhat agitated and resisted therapist) Patient left: in bed;with call bell/phone within reach;with bed alarm set   Time: 1610-9604 OT Time Calculation (min): 15 min Charges:  OT General Charges $OT Visit: 1 Procedure OT Evaluation $OT Eval Moderate Complexity: 1 Procedure G-Codes: OT G-codes **NOT FOR INPATIENT CLASS** Functional Limitation: Self care Self Care Current Status (V4098): At least 80 percent but less than 100 percent impaired, limited or restricted Self Care Goal Status (J1914): At least 20 percent but less than 40 percent impaired, limited or restricted  Edwin Cap , MS, OTR/L, CLT Pager: [phone]  12/11/2015, 2:41 PM

## 2015-12-11 NOTE — Progress Notes (Signed)
  Echocardiogram 2D Echocardiogram has been performed.  Leta Jungling M 12/11/2015, 12:32 PM

## 2015-12-11 NOTE — Progress Notes (Signed)
[facility] PROGRESS NOTE  Trayven Lumadue [id number]:[id number] DOB: 14-Nov-1950 DOA: 12/09/2015 PCP: Colon Branch, MD  HPI/Brief narrative 65 year old male patient with known developmental disability and dementia, history of DVT on Xarelto, hypertension, anemia, chronic kidney disease stage III and dyslipidemia. Patient was sent from [facility] facility with reports that "he was not acting right".  Pt was admitted with concerns of failure to thrive  Assessment/Plan:  FTT in adult/Dementia/ Mental retardation -No obvious infectious or metabolic etiology to patient's presenting symptomatology and suspect he may require a higher level of care that is unable to be provided at the assisted living facility -Patient was admitted to floor -TSH was normal -Ammonia normal  -Pt clinically improved with supportive care.  - Suspect patient would ultimately benefit from SNF level care   Hypothermia -Nursing staff and ER reported patient appeared to not be appropriately dressed for current cold weather -No obvious infectious etiology presenting symptomatology (chest x-ray negative, urinalysis negative) -Flu test neg -Cortisol normal -body temperature has since normalized with supportive care only   Hypertension/left ventricular diastolic dysfunction grade 1/mild AS and mild AR -Current blood pressure suboptimal -Held Lasix -Repeat echo done 3/2. Results pending   CKD stage 3, GFR 30-59 ml/min -Renal function stable and at baseline   Anemia -Hemoglobin appears to be stable and at baseline of around 10   HLD  -Continue omega-3 3 fatty acid   History of DVT  -Remote and has been on Xarelto -Right lower extremity below the knee was significantly edematous -RLE doppler neg for DVT  Code Status: Full Family Communication: Pt in rom Disposition Plan: D/c planning pending PT/OT eval   Consultants:    Procedures:    Antibiotics: Anti-infectives    None       HPI/Subjective: Reports feeling well  Objective: Filed Vitals:   12/10/15 2225 12/11/15 0000 12/11/15 0649 12/11/15 1400  BP: 103/71  102/62 112/68  Pulse: 88  62 66  Temp: 99.6 F (37.6 C) 98.7 F (37.1 C) 97.5 F (36.4 C) 98 F (36.7 C)  TempSrc: Axillary Oral Oral Oral  Resp: SpO2: 95%  93% 96%    Intake/Output Summary (Last 24 hours) at 12/11/15 1954 Last data filed at 12/11/15 0649  Gross per 24 hour  Intake    240 ml  Output      0 ml  Net    240 ml   There were no vitals filed for this visit.  Exam:   General:  Awake, in nad, laying in bed  Cardiovascular: regular, s1, s2  Respiratory: normal resp effort, no wheezing  Abdomen: soft,nondistended  Musculoskeletal: perfused, no clubbing, no cyanosis  Data Reviewed: Basic Metabolic Panel:  Recent Labs Lab 12/09/15 1123 12/10/15 0557 12/11/15 1249  NA 142 141 140  K 5.0 4.9 5.1  CL 107 105 105  CO2 GLUCOSE 65 81 78  BUN 26* 26* 27*  CREATININE 1.87* 2.08* 1.97*  CALCIUM 9.8 9.6 9.5   Liver Function Tests:  Recent Labs Lab 12/09/15 1123  AST 59*  ALT 51  ALKPHOS 85  BILITOT 0.4  PROT 7.1  ALBUMIN 3.4*   No results for input(s): LIPASE, AMYLASE in the last 168 hours.  Recent Labs Lab 12/09/15 1718  AMMONIA 32   CBC:  Recent Labs Lab 12/09/15 1123 12/10/15 0557  WBC 5.1 6.5  NEUTROABS 3.3  --   HGB 10.9* 10.5*  HCT 32.5* 32.9*  MCV 89.0 88.2  PLT 139* 148*   Cardiac Enzymes: No results for input(s): CKTOTAL, CKMB, CKMBINDEX, TROPONINI in the last 168 hours. BNP (last 3 results)  Recent Labs  10/25/15 2033  BNP 8.3    ProBNP (last 3 results) No results for input(s): PROBNP in the last 8760 hours.  CBG: No results for input(s): GLUCAP in the last 168 hours.  Recent Results (from the past 240 hour(s))  Culture, blood (Routine X 2) w Reflex to ID Panel     Status: None (Preliminary result)   Collection Time: 12/09/15  6:00 PM  Result  Value Ref Range Status   Specimen Description BLOOD LEFT ANTECUBITAL  Final   Special Requests BOTTLES DRAWN AEROBIC AND ANAEROBIC 10CC  Final   Culture NO GROWTH 2 DAYS  Final   Report Status PENDING  Incomplete  Culture, blood (Routine X 2) w Reflex to ID Panel     Status: None (Preliminary result)   Collection Time: 12/09/15  6:08 PM  Result Value Ref Range Status   Specimen Description BLOOD RIGHT HAND  Final   Special Requests BOTTLES DRAWN AEROBIC ONLY 5CC  Final   Culture NO GROWTH 2 DAYS  Final   Report Status PENDING  Incomplete  MRSA PCR Screening     Status: None   Collection Time: 12/09/15  6:21 PM  Result Value Ref Range Status   MRSA by PCR NEGATIVE NEGATIVE Final    Comment:        The GeneXpert MRSA Assay (FDA approved for NASAL specimens only), is one component of a comprehensive MRSA colonization surveillance program. It is not intended to diagnose MRSA infection nor to guide or monitor treatment for MRSA infections.      Studies: No results found.  Scheduled Meds: . ARIPiprazole  30 mg Oral Daily  . cholecalciferol  1,000 Units Oral BID  . docusate sodium  100 mg Oral BID  . donepezil  10 mg Oral QHS  . ferrous sulfate  325 mg Oral BID WC  . gabapentin  100 mg Oral QHS  . memantine  10 mg Oral BID  . omega-3 acid ethyl esters  1 g Oral Daily  . pneumococcal 23 valent vaccine  0.5 mL Intramuscular Tomorrow-1000  . risperiDONE  1 mg Oral QHS  . risperiDONE  2 mg Oral Q breakfast  . rivaroxaban  20 mg Oral Q supper   Continuous Infusions:   Principal Problem:   FTT (failure to thrive) in adult Active Problems:   Hypertension   Anemia   Dementia   HLD (hyperlipidemia)   CKD (chronic kidney disease) stage 3, GFR 30-59 ml/min   Mental retardation   History of DVT (deep vein thrombosis)   Hypothermia   Left ventricular diastolic dysfunction, NYHA class 1   Mild aortic stenosis   Failure to thrive in adult   Absolute anemia    Suad Autrey  K  [facility] Pager [phone]. If 7PM-7AM, please contact night-coverage at [email or web address], password [facility] 12/11/2015, 7:54 PM  LOS: 2 days

## 2015-12-11 NOTE — Discharge Summary (Addendum)
Physician Discharge Summary  [name] ZOX:[id number] DOB: 19-Dec-1950 DOA: 12/09/2015  PCP: Colon Branch, MD  Admit date: 12/09/2015 Discharge date: 12/12/2015  Time spent: 20 minutes  Recommendations for Outpatient Follow-up:  1. Follow up with PCP in 1-2 weeks 2. Recommend repeat renal panel in 1 week, focus on renal function and electrolytes   Discharge Diagnoses:  Principal Problem:   FTT (failure to thrive) in adult Active Problems:   Hypertension   Anemia   Dementia   HLD (hyperlipidemia)   CKD (chronic kidney disease) stage 3, GFR 30-59 ml/min   Mental retardation   History of DVT (deep vein thrombosis)   Hypothermia   Left ventricular diastolic dysfunction, NYHA class 1   Mild aortic stenosis   Failure to thrive in adult   Absolute anemia   Discharge Condition: Improved  Diet recommendation: Heart healthy  There were no vitals filed for this visit.  History of present illness:  Please review dictated H and P from 2/28 for details. Briefly, 65 year old male patient with known developmental disability and dementia, history of DVT on Xarelto, hypertension, anemia, chronic kidney disease stage III and dyslipidemia. Patient was sent from [facility] facility with reports that "he was not acting right". Pt was admitted with concerns of failure to thrive  Hospital Course:   FTT in adult/Dementia/ Mental retardation -No obvious infectious or metabolic etiology to patient's presenting symptomatology and suspect he may require a higher level of care that is unable to be provided at the assisted living facility -Patient was admitted to floor -TSH was normal -Ammonia normal  -Pt clinically improved with supportive care.  -  Suspect patient would ultimately benefit from SNF level care   Hypothermia -Nursing staff and ER reported patient appeared to not be appropriately dressed for current cold weather -No obvious infectious etiology presenting  symptomatology (chest x-ray negative, urinalysis negative) -Flu test neg -Cortisol normal -body temperature has since normalized with supportive care only   Hypertension/left ventricular diastolic dysfunction grade 1/mild AS and mild AR -Current blood pressure suboptimal -Held Lasix -Repeat echo on 3/2 with normal EF and unremarkable.   CKD stage 3, GFR 30-59 ml/min -Renal function stable and at baseline   Anemia -Hemoglobin appears to be stable and at baseline of around 10   HLD  -Continue omega-3 3 fatty acid   History of DVT  -Remote and has been on Xarelto -Right lower extremity below the knee was significantly edematous -RLE doppler neg for DVT  Discharge Exam: Filed Vitals:   12/11/15 0649 12/11/15 1400 12/11/15 2056 12/12/15 0405  BP: 102/62 112/68 104/69 101/67  Pulse: 62 66 106 92  Temp: 97.5 F (36.4 C) 98 F (36.7 C) 98.5 F (36.9 C) 97.6 F (36.4 C)  TempSrc: Oral Oral Oral Oral  Resp: SpO2: 93% 96% 97% 97%    General: Awake, in nad Cardiovascular: regular, s1, s2 Respiratory: normal resp effort, no wheezing  Discharge Instructions     Medication List    STOP taking these medications        potassium chloride 10 MEQ CR tablet  Commonly known as:  KLOR-CON      TAKE these medications        ARIPiprazole 30 MG tablet  Commonly known as:  ABILIFY  Take 30 mg by mouth daily.     donepezil 10 MG tablet  Commonly known as:  ARICEPT  Take 10 mg by mouth at bedtime.  ferrous sulfate 325 (65 FE) MG tablet  Take 325 mg by mouth daily with breakfast.     Fish Oil 1200 MG Caps  Take 1,200 mg by mouth daily.     furosemide 20 MG tablet  Commonly known as:  LASIX  Take 1 tablet (20 mg total) by mouth daily.     gabapentin 100 MG capsule  Commonly known as:  NEURONTIN  Take 100 mg by mouth at bedtime.     LORazepam 0.5 MG tablet  Commonly known as:  ATIVAN  Take 1 tablet (0.5 mg total) by mouth every 4 (four) hours as  needed (for agitation).     memantine 10 MG tablet  Commonly known as:  NAMENDA  Take 10 mg by mouth 2 (two) times daily.     risperiDONE 2 MG tablet  Commonly known as:  RISPERDAL  Take 2 mg by mouth daily with breakfast.     risperiDONE 1 MG tablet  Commonly known as:  RISPERDAL  Take 1 mg by mouth at bedtime. Reported on 10/25/2015     rivaroxaban 20 MG Tabs tablet  Commonly known as:  XARELTO  Take 1 tablet (20 mg total) by mouth daily with supper.     Vitamin D3 1000 units Caps  Take 1 capsule by mouth 2 (two) times daily.       No Known Allergies Follow-up Information    Follow up with Colon Branch, MD. Schedule an appointment as soon as possible for a visit in 1 week.   Specialty:  Internal Medicine   Why:  Hospital follow up   Contact information:   [address] [place] [address] [phone]        The results of significant diagnostics from this hospitalization (including imaging, microbiology, ancillary and laboratory) are listed below for reference.    Significant Diagnostic Studies: Dg Chest 2 View  12/09/2015  CLINICAL DATA:  Hypothermia, not acting right, no longer doing self care EXAM: CHEST  2 VIEW COMPARISON:  10/25/2015 FINDINGS: Cardiomediastinal silhouette is stable. There is poor inspiration. Persistent elevation of the right hemidiaphragm. Stable right basilar atelectasis or scarring and chronic blunting of the right costophrenic angle. No segmental infiltrate or pulmonary edema. IMPRESSION: Persistent elevation of the right hemidiaphragm. Stable right basilar atelectasis or scarring and chronic blunting of the right costophrenic angle. No segmental infiltrate or pulmonary edema. Electronically Signed   By: Natasha Mead M.D.   On: 12/09/2015 14:58   Ct Head Wo Contrast  12/09/2015  CLINICAL DATA:  Failure to thrive, unable to obtain further history. EXAM: CT HEAD WITHOUT CONTRAST TECHNIQUE: Contiguous axial images were obtained from the base  of the skull through the vertex without intravenous contrast. COMPARISON:  10/25/2015 FINDINGS: There is no evidence of mass effect, midline shift or extra-axial fluid collections. There is no evidence of a space-occupying lesion or intracranial hemorrhage. There is no evidence of a cortical-based area of acute infarction. The ventricles and sulci are appropriate for the patient's age. The basal cisterns are patent. Visualized portions of the orbits are unremarkable. The visualized portions of the paranasal sinuses and mastoid air cells are unremarkable. The osseous structures are unremarkable. IMPRESSION: 1. No acute intracranial pathology. Electronically Signed   By: Elige Ko   On: 12/09/2015 13:16    Microbiology: Recent Results (from the past 240 hour(s))  Culture, blood (Routine X 2) w Reflex to ID Panel     Status: None (Preliminary result)   Collection Time:  12/09/15  6:00 PM  Result Value Ref Range Status   Specimen Description BLOOD LEFT ANTECUBITAL  Final   Special Requests BOTTLES DRAWN AEROBIC AND ANAEROBIC 10CC  Final   Culture NO GROWTH 3 DAYS  Final   Report Status PENDING  Incomplete  Culture, blood (Routine X 2) w Reflex to ID Panel     Status: None (Preliminary result)   Collection Time: 12/09/15  6:08 PM  Result Value Ref Range Status   Specimen Description BLOOD RIGHT HAND  Final   Special Requests BOTTLES DRAWN AEROBIC ONLY 5CC  Final   Culture NO GROWTH 3 DAYS  Final   Report Status PENDING  Incomplete  Respiratory virus panel     Status: None   Collection Time: 12/09/15  6:21 PM  Result Value Ref Range Status   Respiratory Syncytial Virus A Negative Negative Final   Respiratory Syncytial Virus B Negative Negative Final   Influenza A Negative Negative Final   Influenza B Negative Negative Final   Parainfluenza 1 Negative Negative Final   Parainfluenza 2 Negative Negative Final   Parainfluenza 3 Negative Negative Final   Metapneumovirus Negative Negative Final    Rhinovirus Negative Negative Final   Adenovirus Negative Negative Final    Comment: (NOTE) Performed At: [facility] [address] [place], [place] [id number] Mila Homer MD VH:[id number]   MRSA PCR Screening     Status: None   Collection Time: 12/09/15  6:21 PM  Result Value Ref Range Status   MRSA by PCR NEGATIVE NEGATIVE Final    Comment:        The GeneXpert MRSA Assay (FDA approved for NASAL specimens only), is one component of a comprehensive MRSA colonization surveillance program. It is not intended to diagnose MRSA infection nor to guide or monitor treatment for MRSA infections.      Labs: Basic Metabolic Panel:  Recent Labs Lab 12/09/15 1123 12/10/15 0557 12/11/15 1249 12/12/15 0824  NA 142 141 140 140  K 5.0 4.9 5.1 4.6  CL 107 105 105 103  CO2 GLUCOSE 65 81 78 83  BUN 26* 26* 27* 28*  CREATININE 1.87* 2.08* 1.97* 1.81*  CALCIUM 9.8 9.6 9.5 9.5   Liver Function Tests:  Recent Labs Lab 12/09/15 1123  AST 59*  ALT 51  ALKPHOS 85  BILITOT 0.4  PROT 7.1  ALBUMIN 3.4*   No results for input(s): LIPASE, AMYLASE in the last 168 hours.  Recent Labs Lab 12/09/15 1718  AMMONIA 32   CBC:  Recent Labs Lab 12/09/15 1123 12/10/15 0557 12/12/15 0824  WBC 5.1 6.5 6.8  NEUTROABS 3.3  --   --   HGB 10.9* 10.5* 11.0*  HCT 32.5* 32.9* 33.2*  MCV 89.0 88.2 89.0  PLT 139* 148* 138*   Cardiac Enzymes: No results for input(s): CKTOTAL, CKMB, CKMBINDEX, TROPONINI in the last 168 hours. BNP: BNP (last 3 results)  Recent Labs  10/25/15 2033  BNP 8.3    ProBNP (last 3 results) No results for input(s): PROBNP in the last 8760 hours.  CBG: No results for input(s): GLUCAP in the last 168 hours.   Signed:  Likisha Alles, Scheryl Marten  [facility] 12/12/2015, 12:53 PM

## 2015-12-11 NOTE — NC FL2 (Addendum)
[place] MEDICAID FL2 LEVEL OF CARE SCREENING TOOL     IDENTIFICATION  Patient Name: [name] Birthdate: 1951/10/06 Sex: male Admission Date (Current Location): 12/09/2015  [place] and [place] Number:  Haynes Bast [id number] O Facility and Address:  [facility], [address], [place], [place] [address]      Provider Number: [id number]  Attending Physician Name and Address:  Jerald Kief, MD  Relative Name and Phone Number:       Current Level of Care: Hospital Recommended Level of Care: Assisted Living Facility Prior Approval Number:    Date Approved/Denied:   PASRR Number:    Discharge Plan: Other (Comment) ([facility])    Current Diagnoses: Patient Active Problem List   Diagnosis Date Noted  . Hypothermia 12/09/2015  . FTT (failure to thrive) in adult 12/09/2015  . Left ventricular diastolic dysfunction, NYHA class 1 12/09/2015  . Mild aortic stenosis 12/09/2015  . Failure to thrive in adult 12/09/2015  . Absolute anemia   . Dehydration 10/25/2015  . Acute kidney injury (HCC) 10/25/2015  . Viral URI 10/25/2015  . AKI (acute kidney injury) (HCC) 10/25/2015  . Conjunctivitis 10/25/2015  . DVT (deep venous thrombosis) (HCC) 06/11/2015  . CKD (chronic kidney disease) stage 3, GFR 30-59 ml/min 06/11/2015  . Mental retardation 06/11/2015  . History of DVT (deep vein thrombosis)   . Anemia 06/19/2014  . Dementia 06/19/2014  . HLD (hyperlipidemia) 06/19/2014  . Leg edema 07/09/2011  . Hypertension     Orientation RESPIRATION BLADDER Height & Weight      (Patient disoriented X4)  Normal Incontinent Weight:   Height:     BEHAVIORAL SYMPTOMS/MOOD NEUROLOGICAL BOWEL NUTRITION STATUS  Wanderer, Other (Comment) (Patient is agitated at times)   Incontinent Diet (Heart Healthy diet)  AMBULATORY STATUS COMMUNICATION OF NEEDS Skin   Extensive Assist (Patient unable to ambulate with physical therapist) Verbally  Normal                       Personal Care Assistance Level of Assistance  Bathing, Feeding, Dressing Bathing Assistance: Maximum assistance Feeding assistance: Independent Dressing Assistance: Maximum assistance     Functional Limitations Info  Sight, Hearing, Speech Sight Info: Adequate Hearing Info: Adequate Speech Info: Impaired (Speech slurred)    SPECIAL CARE FACTORS FREQUENCY  PT (By licensed PT)     PT Frequency: Evaluation 3/1 - a minimum of 2X per week evaluated              Contractures Contractures Info: Not present    Additional Factors Info  Code Status, Allergies Code Status Info: Full code Allergies Info: No known allergies           Current Medications (12/11/2015):  This is the current hospital active medication list Current Facility-Administered Medications  Medication Dose Route Frequency Provider Last Rate Last Dose  . acetaminophen (TYLENOL) tablet 650 mg  650 mg Oral Q6H PRN Russella Dar, NP   650 mg at 12/10/15 2228   Or  . acetaminophen (TYLENOL) suppository 650 mg  650 mg Rectal Q6H PRN Russella Dar, NP      . ARIPiprazole (ABILIFY) tablet 30 mg  30 mg Oral Daily Russella Dar, NP   30 mg at 12/11/15 0904  . cholecalciferol (VITAMIN D) tablet 1,000 Units  1,000 Units Oral BID Russella Dar, NP   1,000 Units at 12/11/15 0904  . docusate sodium (COLACE) capsule 100 mg  100 mg Oral BID Russella Dar, NP   100 mg at 12/11/15 1610  . donepezil (ARICEPT) tablet 10 mg  10 mg Oral QHS Russella Dar, NP   10 mg at 12/10/15 2219  . ferrous sulfate tablet 325 mg  325 mg Oral BID WC Russella Dar, NP   325 mg at 12/11/15 0904  . gabapentin (NEURONTIN) capsule 100 mg  100 mg Oral QHS Russella Dar, NP   100 mg at 12/10/15 2219  . LORazepam (ATIVAN) tablet 0.5 mg  0.5 mg Oral Q4H PRN Russella Dar, NP   0.5 mg at 12/11/15 0904  . memantine (NAMENDA) tablet 10 mg  10 mg Oral BID Russella Dar, NP   10 mg at 12/11/15 0904  .  omega-3 acid ethyl esters (LOVAZA) capsule 1 g  1 g Oral Daily Russella Dar, NP   1 g at 12/11/15 0904  . pneumococcal 23 valent vaccine (PNU-IMMUNE) injection 0.5 mL  0.5 mL Intramuscular Tomorrow-1000 Jerald Kief, MD      . risperiDONE (RISPERDAL) tablet 1 mg  1 mg Oral QHS Russella Dar, NP   1 mg at 12/10/15 2220  . risperiDONE (RISPERDAL) tablet 2 mg  2 mg Oral Q breakfast Russella Dar, NP   2 mg at 12/11/15 1121  . rivaroxaban (XARELTO) tablet 20 mg  20 mg Oral Q supper Russella Dar, NP   20 mg at 12/10/15 1607     Discharge Medications: Please see discharge summary for a list of discharge medications.  Relevant Imaging Results:  Relevant Lab Results:   Additional Information SS# [id number]. Patient is Observation status and not eligible for SNF stay under under Medicare guidelines.  MEDICATIONS: Stop taking potassium chloride 10 MEQ.  Take: Aripiprazole 30 MG tablet; Donepezil 10 MG tablet, Ferrous sulfate 325 MG tablet, Fish Oil 1200 MG Caps, Furosemide 20 MG tablet, Gabapentin 100 MG tablet, Lorazepam 0.5 MG tablet, Memantine 10 MG tablet, Risperidone 2 MG tablet; Risperidone 1 MG tablet, Rivaroxaban 20 MG Tabs tablet, Vitamin D3 1000 units Caps.  Cristobal Goldmann, LCSW

## 2015-12-11 NOTE — Clinical Social Work Note (Signed)
Clinical Social Work Assessment  Patient Details  Name: [name] MRN: [id number] Date of Birth: 31-May-1951  Date of referral:  12/11/15               Reason for consult:  Facility Placement                Permission sought to share information with:  Other (CSW talked with patient's guardian Aerik Polan) Permission granted to share information::  No (Patient did not give consent, however he has a guardian)  Name::     Johnsie Cancel  Agency::     Relationship::  [facility] and legal guardian  Contact Information:  [phone]  Housing/Transportation Living arrangements for the past 2 months:  Assisted Living Facility ([facility]) Source of Information:  Other (Comment Required), Facility (CSW talked with guardian and staff at skilled facility) Patient Interpreter Needed:  None Criminal Activity/Legal Involvement Pertinent to Current Situation/Hospitalization:  No - Comment as needed Significant Relationships:  Other Family Members (Cousin Ernestine) Lives with:  Facility Resident Do you feel safe going back to the place where you live?  Yes (Cousin is comfortable with patient returning to [facility]) Need for family participation in patient care:  Yes (Comment)  Care giving concerns:  CSW talked with patient's cousin and guardian and she expressed no concerns regarding patient returning to [facility].    Social Worker assessment / plan:  CSW talked with Ms. Providence Lanius (legal guardian) by phone regarding discharge plans for patient. Explained to Ms. Providence Lanius that patient is Observation status and Medicare A&B and therefore insurance will not pay for ST rehab. This was discussed and Ms. Providence Lanius indicated that patient will need to return to ALF as he cannot afford private pay at a skilled facility for rehab. Also discussed patient receiving HH therapy at the assisted living facility.  In conversation with Lurena Joiner at [facility], CSW informed that they will accept patient back and patient was  already receiving [facility] services through [facility].    Employment status:  Disabled (Comment on whether or not currently receiving Disability) Insurance information:  Medicare, Medicaid [place] PT Recommendations:  Skilled Nursing Facility Information / Referral to community resources:  Other (Comment Required) (Resource information not needed or requested at this time.)  Patient/Family's Response to care:  Cousin expressed no concerns regarding patient's care during hospitalization.  Patient/Family's Understanding of and Emotional Response to Diagnosis, Current Treatment, and Prognosis:  Not discussed.  Emotional Assessment Appearance:  Other (Comment Required (CSW did not visit with patient) Attitude/Demeanor/Rapport:  Other (Per chart, patient is agitated at times) Affect (typically observed):  Unable to Assess Orientation:   (Per chart, patient disoriented X4) Alcohol / Substance use:  Never Used Psych involvement (Current and /or in the community):  No (Comment)  Discharge Needs  Concerns to be addressed:  Discharge Planning Concerns Readmission within the last 30 days:  No Current discharge risk:  None Barriers to Discharge:  Continued Medical Work up   [employer] Lazaro Arms, LCSW 12/11/2015, 1:32 PM

## 2015-12-11 NOTE — Care Management Note (Signed)
Case Management Note  Patient Details  Name: Kycen Spalla MRN: [id number] Date of Birth: 1950-11-28  Subjective/Objective:                    Action/Plan:  Spoke with Lurena Joiner at [facility] , patient was active with [facility] for home health . Received orders for [facility], PT, OT and aide . Called Abby with [facility]  Expected Discharge Date:                  Expected Discharge Plan:  Assisted Living / Rest Home  In-House Referral:  Clinical Social Work  Discharge planning Services  CM Consult  Post Acute Care Choice:  NA Choice offered to:  NA  DME Arranged:  N/A DME Agency:  NA  HH Arranged:  RN, PT, OT, Nurse's Aide HH Agency:  [facility]  Status of Service:  Completed, signed off  Medicare Important Message Given:    Date Medicare IM Given:    Medicare IM give by:    Date Additional Medicare IM Given:    Additional Medicare Important Message give by:     If discussed at Long Length of Stay Meetings, dates discussed:    Additional Comments:  Kingsley Plan, RN 12/11/2015, 3:06 PM

## 2015-12-11 NOTE — Progress Notes (Signed)
  Echocardiogram 2D Echocardiogram has been performed.  Leta Jungling M 12/11/2015, 10:13 AM

## 2015-12-11 NOTE — Clinical Social Work Note (Signed)
Patient will discharge back to [facility] today, transported by ambulance. Patient's guardian (cousin) contacted and is informed of patient's readiness for discharge. Facility aware and is agreeable to patient returning.   Genelle Bal, MSW, LCSW Licensed Clinical Social Worker Clinical Social Work Department [employer] [phone]

## 2015-12-12 DIAGNOSIS — F0390 Unspecified dementia without behavioral disturbance: Secondary | ICD-10-CM | POA: Diagnosis not present

## 2015-12-12 DIAGNOSIS — N183 Chronic kidney disease, stage 3 (moderate): Secondary | ICD-10-CM | POA: Diagnosis not present

## 2015-12-12 DIAGNOSIS — R627 Adult failure to thrive: Secondary | ICD-10-CM | POA: Diagnosis not present

## 2015-12-12 LAB — BASIC METABOLIC PANEL
ANION GAP: 11 (ref 5–15)
BUN: 28 mg/dL — ABNORMAL HIGH (ref 6–20)
CHLORIDE: 103 mmol/L (ref 101–111)
CO2: 26 mmol/L (ref 22–32)
Calcium: 9.5 mg/dL (ref 8.9–10.3)
Creatinine, Ser: 1.81 mg/dL — ABNORMAL HIGH (ref 0.61–1.24)
GFR calc Af Amer: 45 mL/min — ABNORMAL LOW (ref >60)
GFR, EST NON AFRICAN AMERICAN: 38 mL/min — AB (ref >60)
Glucose, Bld: 83 mg/dL (ref 65–99)
POTASSIUM: 4.6 mmol/L (ref 3.5–5.1)
SODIUM: 140 mmol/L (ref 135–145)

## 2015-12-12 LAB — CBC
HCT: 33.2 % — ABNORMAL LOW (ref 39.0–52.0)
HEMOGLOBIN: 11 g/dL — AB (ref 13.0–17.0)
MCH: 29.5 pg (ref 26.0–34.0)
MCHC: 33.1 g/dL (ref 30.0–36.0)
MCV: 89 fL (ref 78.0–100.0)
PLATELETS: 138 10*3/uL — AB (ref 150–400)
RBC: 3.73 MIL/uL — AB (ref 4.22–5.81)
RDW: 16.4 % — ABNORMAL HIGH (ref 11.5–15.5)
WBC: 6.8 10*3/uL (ref 4.0–10.5)

## 2015-12-12 NOTE — Progress Notes (Signed)
Pt scheduled for discharge back [facility] this pm. Social worker making transportation arrangements. Report called and given to reporting facility nurse

## 2015-12-12 NOTE — Clinical Social Work Note (Signed)
Patient medically stable for discharge back to [facility] today. Discharge information transmitted to facility and patient will be transported by ambulance.   Genelle BalVanessa Afton Lavalle, MSW, LCSW Licensed Clinical Social Worker Clinical Social Work Department [facility] [phone]

## 2015-12-12 NOTE — Progress Notes (Signed)
[facility]

## 2015-12-12 NOTE — Progress Notes (Signed)
Physical Therapy Treatment Patient Details Name: [name] MRN: [id number] DOB: 07/06/1951 Today's Date: 12/12/2015    History of Present Illness [name] is a 65 y.o. male with a Past Medical History of MR, HLD, DVT, Dementia, HTN who presents with hypothermia, and anorexia/FTT    PT Comments    Pt agitated this session, continues to perseverate on going home, PT not able to explain how exercise and mobility would help pt. Pt initially agreeable to bed level exercise. Pt performed AAROM with max encouragement throughout. After 10 minutes pt states he is done because he needs to go home, unable to redirect pt to continue to participate.  Follow Up Recommendations  SNF     Equipment Recommendations  None recommended by PT    Recommendations for Other Services       Precautions / Restrictions Precautions Precautions: Fall Precaution Comments: Consider gowning up to work with him; noted he has difficulty managing his saliva Restrictions Weight Bearing Restrictions: No    Mobility  Bed Mobility                  Transfers                    Ambulation/Gait                 Stairs            Wheelchair Mobility    Modified Rankin (Stroke Patients Only)       Balance                                    Cognition Arousal/Alertness: Awake/alert Behavior During Therapy: Agitated;Restless Overall Cognitive Status: No family/caregiver present to determine baseline cognitive functioning                      Exercises General Exercises - Lower Extremity Ankle Circles/Pumps: AAROM;Both;10 reps Heel Slides: AAROM;Both;10 reps Hip ABduction/ADduction: AAROM;Both;10 reps    General Comments        Pertinent Vitals/Pain Pain Assessment: No/denies pain    Home Living                      Prior Function            PT Goals (current goals can now be found in the care plan section) Progress towards  PT goals: Progressing toward goals    Frequency  Min 2X/week    PT Plan Current plan remains appropriate    Co-evaluation             End of Session   Activity Tolerance:  (treatment limited by agitation, cognition) Patient left: in bed;with call bell/phone within reach;with bed alarm set     Time: 1045-1055 PT Time Calculation (min) (ACUTE ONLY): 10 min  Charges:  $Therapeutic Activity: 8-22 mins                    G Codes:  Functional Assessment Tool Used: Clinical Judgement Functional Limitation: Mobility: Walking and moving around Mobility: Walking and Moving Around Current Status ([id number]): At least 60 percent but less than 80 percent impaired, limited or restricted Mobility: Walking and Moving Around Goal Status [phone]([id number]): At least 1 percent but less than 20 percent impaired, limited or restricted   [facility] CenterDONAWERTH,KAREN 12/12/2015, 10:58 AM

## 2015-12-12 NOTE — Progress Notes (Signed)
Nutrition Brief Note  Patient identified on the Malnutrition Screening Tool (MST) Report  Wt Readings from Last 15 Encounters:  10/28/15 263 lb 9.7 oz (119.57 kg)  06/10/15 212 lb (96.163 kg)  05/31/15 212 lb 7 oz (96.361 kg)  06/20/14 200 lb 2.8 oz (90.8 kg)  08/03/11 200 lb (90.719 kg)  07/09/11 201 lb (91.43173 kg)   65 year old male patient with known developmental disability and dementia, history of DVT on Xarelto, hypertension, anemia, chronic kidney disease stage III and dyslipidemia. Patient was [facility] with reports that "he was not acting right". Pt was admitted with concerns of failure to thrive  Pt lying at bed at time of visit. Unable to obtain hx due to cognitive deficit. Responds to most questions with "basketball".   Nutrition-Focused physical exam completed. Findings are no fat depletion, no muscle depletion, and no edema.   Case discussed with RN. He reports pt eats very well, but requires total assistance with feeding.   Per MD notes, pt may require higher level of care. CSW following.   Estimated body mass index is 38.91 kg/(m^2) as calculated from the following:   Height as of 10/26/15: 5\' 9"  (1.753 m).   Weight as of 10/25/15: 263 lb 9.7 oz (119.57 kg). Patient meets criteria for obesity, class II based on current BMI.   Current diet order is Heart Healthy, patient is consuming approximately 100% of meals at this time. Labs and medications reviewed.   No nutrition interventions warranted at this time. If nutrition issues arise, please consult RD.   Sanel Stemmer A. Mayford KnifeWilliams, RD, LDN, CDE Pager: [phone] After hours Pager: [phone]

## 2015-12-12 NOTE — Progress Notes (Signed)
Pneumonia vaccine administered during this admission

## 2015-12-14 LAB — CULTURE, BLOOD (ROUTINE X 2)
CULTURE: NO GROWTH
Culture: NO GROWTH

## 2016-08-22 ENCOUNTER — Encounter (HOSPITAL_COMMUNITY): Payer: Self-pay | Admitting: Emergency Medicine

## 2016-08-22 ENCOUNTER — Emergency Department (HOSPITAL_COMMUNITY): Payer: Medicare Other

## 2016-08-22 ENCOUNTER — Inpatient Hospital Stay (HOSPITAL_COMMUNITY)
Admission: EM | Admit: 2016-08-22 | Discharge: 2016-09-07 | DRG: 871 | Disposition: A | Discharge status: Skilled Nursing Facility | Payer: Medicare Other | Attending: Internal Medicine | Admitting: Internal Medicine

## 2016-08-22 DIAGNOSIS — N183 Chronic kidney disease, stage 3 unspecified: Secondary | ICD-10-CM | POA: Diagnosis present

## 2016-08-22 DIAGNOSIS — L8990 Pressure ulcer of unspecified site, unspecified stage: Secondary | ICD-10-CM | POA: Diagnosis present

## 2016-08-22 DIAGNOSIS — R6521 Severe sepsis with septic shock: Secondary | ICD-10-CM | POA: Diagnosis present

## 2016-08-22 DIAGNOSIS — Z66 Do not resuscitate: Secondary | ICD-10-CM | POA: Diagnosis present

## 2016-08-22 DIAGNOSIS — R4182 Altered mental status, unspecified: Secondary | ICD-10-CM | POA: Diagnosis present

## 2016-08-22 DIAGNOSIS — J181 Lobar pneumonia, unspecified organism: Secondary | ICD-10-CM | POA: Diagnosis not present

## 2016-08-22 DIAGNOSIS — R680 Hypothermia, not associated with low environmental temperature: Secondary | ICD-10-CM | POA: Diagnosis present

## 2016-08-22 DIAGNOSIS — E038 Other specified hypothyroidism: Secondary | ICD-10-CM | POA: Diagnosis present

## 2016-08-22 DIAGNOSIS — F0390 Unspecified dementia without behavioral disturbance: Secondary | ICD-10-CM | POA: Diagnosis present

## 2016-08-22 DIAGNOSIS — T148XXA Other injury of unspecified body region, initial encounter: Secondary | ICD-10-CM | POA: Diagnosis present

## 2016-08-22 DIAGNOSIS — N170 Acute kidney failure with tubular necrosis: Secondary | ICD-10-CM | POA: Diagnosis present

## 2016-08-22 DIAGNOSIS — B348 Other viral infections of unspecified site: Secondary | ICD-10-CM | POA: Diagnosis not present

## 2016-08-22 DIAGNOSIS — T68XXXA Hypothermia, initial encounter: Secondary | ICD-10-CM

## 2016-08-22 DIAGNOSIS — E669 Obesity, unspecified: Secondary | ICD-10-CM | POA: Diagnosis present

## 2016-08-22 DIAGNOSIS — Z6827 Body mass index (BMI) 27.0-27.9, adult: Secondary | ICD-10-CM

## 2016-08-22 DIAGNOSIS — D649 Anemia, unspecified: Secondary | ICD-10-CM | POA: Diagnosis present

## 2016-08-22 DIAGNOSIS — G92 Toxic encephalopathy: Secondary | ICD-10-CM | POA: Diagnosis present

## 2016-08-22 DIAGNOSIS — K5900 Constipation, unspecified: Secondary | ICD-10-CM | POA: Diagnosis not present

## 2016-08-22 DIAGNOSIS — D696 Thrombocytopenia, unspecified: Secondary | ICD-10-CM | POA: Diagnosis present

## 2016-08-22 DIAGNOSIS — L89312 Pressure ulcer of right buttock, stage 2: Secondary | ICD-10-CM | POA: Diagnosis not present

## 2016-08-22 DIAGNOSIS — J189 Pneumonia, unspecified organism: Secondary | ICD-10-CM | POA: Diagnosis not present

## 2016-08-22 DIAGNOSIS — F0150 Vascular dementia without behavioral disturbance: Secondary | ICD-10-CM | POA: Diagnosis not present

## 2016-08-22 DIAGNOSIS — F0391 Unspecified dementia with behavioral disturbance: Secondary | ICD-10-CM | POA: Diagnosis present

## 2016-08-22 DIAGNOSIS — Z86718 Personal history of other venous thrombosis and embolism: Secondary | ICD-10-CM

## 2016-08-22 DIAGNOSIS — R627 Adult failure to thrive: Secondary | ICD-10-CM | POA: Diagnosis present

## 2016-08-22 DIAGNOSIS — M6281 Muscle weakness (generalized): Secondary | ICD-10-CM

## 2016-08-22 DIAGNOSIS — E870 Hyperosmolality and hypernatremia: Secondary | ICD-10-CM | POA: Diagnosis present

## 2016-08-22 DIAGNOSIS — R1313 Dysphagia, pharyngeal phase: Secondary | ICD-10-CM | POA: Diagnosis present

## 2016-08-22 DIAGNOSIS — R042 Hemoptysis: Secondary | ICD-10-CM | POA: Diagnosis not present

## 2016-08-22 DIAGNOSIS — I5032 Chronic diastolic (congestive) heart failure: Secondary | ICD-10-CM | POA: Diagnosis present

## 2016-08-22 DIAGNOSIS — R0902 Hypoxemia: Secondary | ICD-10-CM | POA: Diagnosis present

## 2016-08-22 DIAGNOSIS — A419 Sepsis, unspecified organism: Secondary | ICD-10-CM | POA: Diagnosis present

## 2016-08-22 DIAGNOSIS — Z79899 Other long term (current) drug therapy: Secondary | ICD-10-CM

## 2016-08-22 DIAGNOSIS — I82409 Acute embolism and thrombosis of unspecified deep veins of unspecified lower extremity: Secondary | ICD-10-CM | POA: Diagnosis not present

## 2016-08-22 DIAGNOSIS — Z7901 Long term (current) use of anticoagulants: Secondary | ICD-10-CM

## 2016-08-22 DIAGNOSIS — G928 Other toxic encephalopathy: Secondary | ICD-10-CM | POA: Diagnosis present

## 2016-08-22 DIAGNOSIS — F79 Unspecified intellectual disabilities: Secondary | ICD-10-CM

## 2016-08-22 DIAGNOSIS — F72 Severe intellectual disabilities: Secondary | ICD-10-CM | POA: Diagnosis present

## 2016-08-22 DIAGNOSIS — A408 Other streptococcal sepsis: Secondary | ICD-10-CM | POA: Diagnosis not present

## 2016-08-22 DIAGNOSIS — I959 Hypotension, unspecified: Secondary | ICD-10-CM | POA: Diagnosis present

## 2016-08-22 DIAGNOSIS — E785 Hyperlipidemia, unspecified: Secondary | ICD-10-CM | POA: Diagnosis present

## 2016-08-22 DIAGNOSIS — J13 Pneumonia due to Streptococcus pneumoniae: Secondary | ICD-10-CM | POA: Diagnosis present

## 2016-08-22 DIAGNOSIS — I130 Hypertensive heart and chronic kidney disease with heart failure and stage 1 through stage 4 chronic kidney disease, or unspecified chronic kidney disease: Secondary | ICD-10-CM | POA: Diagnosis present

## 2016-08-22 DIAGNOSIS — Z8249 Family history of ischemic heart disease and other diseases of the circulatory system: Secondary | ICD-10-CM

## 2016-08-22 LAB — RESPIRATORY PANEL BY PCR
ADENOVIRUS-RVPPCR: NOT DETECTED
Bordetella pertussis: NOT DETECTED
CORONAVIRUS NL63-RVPPCR: NOT DETECTED
CORONAVIRUS OC43-RVPPCR: NOT DETECTED
Chlamydophila pneumoniae: NOT DETECTED
Coronavirus 229E: NOT DETECTED
Coronavirus HKU1: NOT DETECTED
INFLUENZA A-RVPPCR: NOT DETECTED
INFLUENZA B-RVPPCR: NOT DETECTED
METAPNEUMOVIRUS-RVPPCR: NOT DETECTED
Mycoplasma pneumoniae: NOT DETECTED
PARAINFLUENZA VIRUS 1-RVPPCR: NOT DETECTED
PARAINFLUENZA VIRUS 2-RVPPCR: NOT DETECTED
PARAINFLUENZA VIRUS 4-RVPPCR: NOT DETECTED
Parainfluenza Virus 3: NOT DETECTED
RESPIRATORY SYNCYTIAL VIRUS-RVPPCR: NOT DETECTED
Rhinovirus / Enterovirus: DETECTED — AB

## 2016-08-22 LAB — CBC WITH DIFFERENTIAL/PLATELET
BASOS ABS: 0 10*3/uL (ref 0.0–0.1)
BASOS PCT: 0 %
EOS PCT: 1 %
Eosinophils Absolute: 0 10*3/uL (ref 0.0–0.7)
HCT: 34 % — ABNORMAL LOW (ref 39.0–52.0)
Hemoglobin: 11.1 g/dL — ABNORMAL LOW (ref 13.0–17.0)
LYMPHS PCT: 20 %
Lymphs Abs: 1.2 10*3/uL (ref 0.7–4.0)
MCH: 27.6 pg (ref 26.0–34.0)
MCHC: 32.6 g/dL (ref 30.0–36.0)
MCV: 84.6 fL (ref 78.0–100.0)
Monocytes Absolute: 0.3 10*3/uL (ref 0.1–1.0)
Monocytes Relative: 6 %
Neutro Abs: 4.2 10*3/uL (ref 1.7–7.7)
Neutrophils Relative %: 73 %
PLATELETS: 114 10*3/uL — AB (ref 150–400)
RBC: 4.02 MIL/uL — AB (ref 4.22–5.81)
RDW: 19.2 % — ABNORMAL HIGH (ref 11.5–15.5)
WBC: 5.8 10*3/uL (ref 4.0–10.5)

## 2016-08-22 LAB — COMPREHENSIVE METABOLIC PANEL
ALK PHOS: 83 U/L (ref 38–126)
ALT: 44 U/L (ref 17–63)
AST: 42 U/L — AB (ref 15–41)
Albumin: 3.3 g/dL — ABNORMAL LOW (ref 3.5–5.0)
Anion gap: 9 (ref 5–15)
BILIRUBIN TOTAL: 0.4 mg/dL (ref 0.3–1.2)
BUN: 44 mg/dL — AB (ref 6–20)
CO2: 26 mmol/L (ref 22–32)
CREATININE: 1.68 mg/dL — AB (ref 0.61–1.24)
Calcium: 9.6 mg/dL (ref 8.9–10.3)
Chloride: 109 mmol/L (ref 101–111)
GFR calc Af Amer: 48 mL/min — ABNORMAL LOW (ref >60)
GFR, EST NON AFRICAN AMERICAN: 42 mL/min — AB (ref >60)
GLUCOSE: 123 mg/dL — AB (ref 65–99)
Potassium: 4.7 mmol/L (ref 3.5–5.1)
Sodium: 144 mmol/L (ref 135–145)
TOTAL PROTEIN: 7.5 g/dL (ref 6.5–8.1)

## 2016-08-22 LAB — LACTIC ACID, PLASMA: Lactic Acid, Venous: 1.1 mmol/L (ref 0.5–1.9)

## 2016-08-22 LAB — CBG MONITORING, ED: GLUCOSE-CAPILLARY: 97 mg/dL (ref 65–99)

## 2016-08-22 LAB — PROCALCITONIN: Procalcitonin: 0.32 ng/mL

## 2016-08-22 LAB — TSH: TSH: 5.377 u[IU]/mL — ABNORMAL HIGH (ref 0.350–4.500)

## 2016-08-22 LAB — URINALYSIS, ROUTINE W REFLEX MICROSCOPIC
Bilirubin Urine: NEGATIVE
Glucose, UA: NEGATIVE mg/dL
Hgb urine dipstick: NEGATIVE
Ketones, ur: NEGATIVE mg/dL
LEUKOCYTES UA: NEGATIVE
NITRITE: NEGATIVE
PROTEIN: NEGATIVE mg/dL
Specific Gravity, Urine: 1.016 (ref 1.005–1.030)
pH: 5 (ref 5.0–8.0)

## 2016-08-22 LAB — MRSA PCR SCREENING: MRSA by PCR: NEGATIVE

## 2016-08-22 LAB — I-STAT CG4 LACTIC ACID, ED
LACTIC ACID, VENOUS: 2.11 mmol/L — AB (ref 0.5–1.9)
Lactic Acid, Venous: 0.92 mmol/L (ref 0.5–1.9)

## 2016-08-22 LAB — STREP PNEUMONIAE URINARY ANTIGEN: Strep Pneumo Urinary Antigen: POSITIVE — AB

## 2016-08-22 MED ORDER — RISPERIDONE 0.5 MG PO TABS
2.0000 mg | ORAL_TABLET | Freq: Every day | ORAL | Status: DC
  Filled 2016-08-22: qty 4

## 2016-08-22 MED ORDER — MEMANTINE HCL 10 MG PO TABS
10.0000 mg | ORAL_TABLET | Freq: Two times a day (BID) | ORAL | Status: DC
  Filled 2016-08-22: qty 1

## 2016-08-22 MED ORDER — LORAZEPAM 2 MG/ML IJ SOLN: 0.5000 mg | Freq: Four times a day (QID) | INTRAMUSCULAR | Status: DC | PRN

## 2016-08-22 MED ORDER — DEXTROSE 5 % IV SOLN
1.0000 g | Freq: Three times a day (TID) | INTRAVENOUS | Status: DC
  Filled 2016-08-22 (×3): qty 1

## 2016-08-22 MED ORDER — GABAPENTIN 100 MG PO CAPS: 100.0000 mg | ORAL_CAPSULE | Freq: Every day | ORAL | Status: DC

## 2016-08-22 MED ORDER — DEXTROSE 5 % IV SOLN
1.0000 g | INTRAVENOUS | Status: DC
  Filled 2016-08-22: qty 10

## 2016-08-22 MED ORDER — SODIUM CHLORIDE 0.9 % IV BOLUS (SEPSIS)
1000.0000 mL | Freq: Once | INTRAVENOUS | Status: AC
  Administered 2016-08-22: 1000 mL via INTRAVENOUS

## 2016-08-22 MED ORDER — ENOXAPARIN SODIUM 100 MG/ML ~~LOC~~ SOLN
90.0000 mg | Freq: Two times a day (BID) | SUBCUTANEOUS | Status: DC
  Administered 2016-08-22 – 2016-08-24 (×5): 90 mg via SUBCUTANEOUS
  Filled 2016-08-22 (×5): qty 1

## 2016-08-22 MED ORDER — RISPERIDONE 0.5 MG PO TABS: 1.0000 mg | ORAL_TABLET | Freq: Every day | ORAL | Status: DC

## 2016-08-22 MED ORDER — FERROUS SULFATE 325 (65 FE) MG PO TABS
325.0000 mg | ORAL_TABLET | Freq: Every day | ORAL | Status: DC
  Filled 2016-08-22: qty 1

## 2016-08-22 MED ORDER — DEXTROSE 5 % IV SOLN
500.0000 mg | Freq: Once | INTRAVENOUS | Status: AC
  Administered 2016-08-22: 500 mg via INTRAVENOUS
  Filled 2016-08-22: qty 500

## 2016-08-22 MED ORDER — RIVAROXABAN 20 MG PO TABS: 20.0000 mg | ORAL_TABLET | Freq: Every day | ORAL | Status: DC

## 2016-08-22 MED ORDER — DEXTROSE 5 % IV SOLN
500.0000 mg | INTRAVENOUS | Status: DC
  Filled 2016-08-22: qty 500

## 2016-08-22 MED ORDER — VANCOMYCIN HCL 10 G IV SOLR
1500.0000 mg | INTRAVENOUS | Status: DC
  Filled 2016-08-22: qty 1500

## 2016-08-22 MED ORDER — ENOXAPARIN SODIUM 40 MG/0.4ML ~~LOC~~ SOLN
40.0000 mg | SUBCUTANEOUS | Status: DC
  Filled 2016-08-22: qty 0.4

## 2016-08-22 MED ORDER — CEFTRIAXONE SODIUM 1 G IJ SOLR
1.0000 g | Freq: Once | INTRAMUSCULAR | Status: AC
  Administered 2016-08-22: 1 g via INTRAVENOUS
  Filled 2016-08-22: qty 10

## 2016-08-22 MED ORDER — LEVALBUTEROL HCL 0.63 MG/3ML IN NEBU: 0.6300 mg | INHALATION_SOLUTION | RESPIRATORY_TRACT | Status: DC | PRN

## 2016-08-22 MED ORDER — SODIUM CHLORIDE 0.9 % IV SOLN
INTRAVENOUS | Status: DC
  Administered 2016-08-22: 06:00:00 via INTRAVENOUS

## 2016-08-22 MED ORDER — ONDANSETRON HCL 4 MG PO TABS: 4.0000 mg | ORAL_TABLET | Freq: Four times a day (QID) | ORAL | Status: DC | PRN

## 2016-08-22 MED ORDER — ORAL CARE MOUTH RINSE
15.0000 mL | Freq: Two times a day (BID) | OROMUCOSAL | Status: DC
  Administered 2016-08-22 – 2016-09-07 (×28): 15 mL via OROMUCOSAL

## 2016-08-22 MED ORDER — FUROSEMIDE 10 MG/ML IJ SOLN
80.0000 mg | Freq: Once | INTRAMUSCULAR | Status: AC
  Administered 2016-08-22: 80 mg via INTRAVENOUS
  Filled 2016-08-22: qty 8

## 2016-08-22 MED ORDER — VANCOMYCIN HCL 10 G IV SOLR
2000.0000 mg | Freq: Once | INTRAVENOUS | Status: AC
  Administered 2016-08-22: 2000 mg via INTRAVENOUS
  Filled 2016-08-22: qty 2000

## 2016-08-22 MED ORDER — HYDROCORTISONE NA SUCCINATE PF 100 MG IJ SOLR
50.0000 mg | Freq: Three times a day (TID) | INTRAMUSCULAR | Status: DC
  Administered 2016-08-22 – 2016-08-25 (×9): 50 mg via INTRAVENOUS
  Filled 2016-08-22 (×9): qty 2

## 2016-08-22 MED ORDER — ACETAMINOPHEN 325 MG PO TABS: 650.0000 mg | ORAL_TABLET | Freq: Four times a day (QID) | ORAL | Status: DC | PRN

## 2016-08-22 MED ORDER — MORPHINE SULFATE (PF) 2 MG/ML IV SOLN
2.0000 mg | INTRAVENOUS | Status: DC | PRN
  Administered 2016-08-22: 2 mg via INTRAVENOUS
  Filled 2016-08-22 (×2): qty 1

## 2016-08-22 MED ORDER — LORAZEPAM 2 MG/ML IJ SOLN
0.5000 mg | INTRAMUSCULAR | Status: DC | PRN
  Administered 2016-08-22: 1 mg via INTRAVENOUS
  Filled 2016-08-22: qty 1

## 2016-08-22 MED ORDER — DONEPEZIL HCL 10 MG PO TABS: 10.0000 mg | ORAL_TABLET | Freq: Every day | ORAL | Status: DC

## 2016-08-22 MED ORDER — ENOXAPARIN SODIUM 60 MG/0.6ML ~~LOC~~ SOLN: 60.0000 mg | Freq: Two times a day (BID) | SUBCUTANEOUS | Status: DC

## 2016-08-22 MED ORDER — ACETAMINOPHEN 650 MG RE SUPP: 650.0000 mg | Freq: Four times a day (QID) | RECTAL | Status: DC | PRN

## 2016-08-22 MED ORDER — ARIPIPRAZOLE 5 MG PO TABS: 30.0000 mg | ORAL_TABLET | Freq: Every day | ORAL | Status: DC

## 2016-08-22 MED ORDER — LORAZEPAM 0.5 MG PO TABS: 0.5000 mg | ORAL_TABLET | ORAL | Status: DC | PRN

## 2016-08-22 MED ORDER — LORAZEPAM 2 MG/ML IJ SOLN: 1.0000 mg | INTRAMUSCULAR | Status: DC | PRN

## 2016-08-22 MED ORDER — DEXTROSE 5 % IV SOLN
1.0000 g | Freq: Two times a day (BID) | INTRAVENOUS | Status: DC
  Administered 2016-08-22 – 2016-08-23 (×2): 1 g via INTRAVENOUS
  Filled 2016-08-22 (×4): qty 1

## 2016-08-22 MED ORDER — SODIUM CHLORIDE 0.9 % IV BOLUS (SEPSIS)
500.0000 mL | Freq: Once | INTRAVENOUS | Status: AC
  Administered 2016-08-22: 500 mL via INTRAVENOUS

## 2016-08-22 MED ORDER — ONDANSETRON HCL 4 MG/2ML IJ SOLN: 4.0000 mg | Freq: Four times a day (QID) | INTRAMUSCULAR | Status: DC | PRN

## 2016-08-22 MED ORDER — ACETYLCYSTEINE 20 % IN SOLN
4.0000 mL | Freq: Three times a day (TID) | RESPIRATORY_TRACT | Status: DC
  Administered 2016-08-22 (×2): 4 mL via RESPIRATORY_TRACT
  Filled 2016-08-22 (×3): qty 4

## 2016-08-22 MED ORDER — ALBUTEROL SULFATE (2.5 MG/3ML) 0.083% IN NEBU
2.5000 mg | INHALATION_SOLUTION | Freq: Three times a day (TID) | RESPIRATORY_TRACT | Status: DC
  Administered 2016-08-22: 2.5 mg via RESPIRATORY_TRACT
  Filled 2016-08-22: qty 3

## 2016-08-22 MED ORDER — LEVALBUTEROL HCL 0.63 MG/3ML IN NEBU
0.6300 mg | INHALATION_SOLUTION | Freq: Three times a day (TID) | RESPIRATORY_TRACT | Status: DC
  Administered 2016-08-22 – 2016-09-02 (×29): 0.63 mg via RESPIRATORY_TRACT
  Filled 2016-08-22 (×35): qty 3

## 2016-08-22 NOTE — Progress Notes (Addendum)
ANTICOAGULATION CONSULT NOTE - Initial Consult  Pharmacy Consult for lovenox Indication: VTE treatment  No Known Allergies  Patient Measurements: Height: 6' (182.9 cm) Weight: 201 lb 1 oz (91.2 kg) IBW/kg (Calculated) : 77.6  Vital Signs: Temp: 97.5 F (36.4 C) (11/12 0700) Temp Source: Oral (11/12 0700) BP: 111/75 (11/12 0700) Pulse Rate: 97 (11/12 0700)  Labs:  Recent Labs  08/22/16 0049 08/22/16 0125  HGB 11.1*  --   HCT 34.0*  --   PLT 114*  --   CREATININE  --  1.68*    Estimated Creatinine Clearance: 49.4 mL/min (by C-G formula based on SCr of 1.68 mg/dL (H)).   Medical History: Past Medical History:  Diagnosis Date  . Dementia 06/19/2014  . DVT (deep venous thrombosis) (HCC)    right leg  . Dyslipidemia   . Hypertension   . Mental retardation   . Obesity   . Other and unspecified hyperlipidemia 06/19/2014    Assessment: PTA Xarelto for h/o of DVT held. Lovenox 90mg  (1mg /kg) BID VTE PPX started inpatient -Hgb 11.1, Plts 114 -SCr 1.68 (this is probably around his baseline as of recently), CrCl ~50  Goal of Therapy:  Monitor platelets by anticoagulation protocol: Yes   Plan:  -Lovenox 90mg  BID  -Monitor renal function  Gwyndolyn KaufmanKai Alexica Schlossberg Bernette Redbird(Kenny), PharmD  PGY1 Pharmacy Resident Pager: [phone] 08/22/2016 9:49 AM

## 2016-08-22 NOTE — H&P (Signed)
History and Physical  Patient Name: [name]     WUJ:[id number]    DOB: 02/25/1951    DOA: 08/22/2016 PCP: Colon BranchQURESHI, AYYAZ, MD   Patient coming from: [facility]  Chief Complaint: Cough and weakness  HPI: [name] is a 65 y.o. male with a past medical history significant for severe MR, HTN, DVT on Xarelto, CKD and HFpEF who presents with worsening cough for one week and weakness.  At baseline, the patient is nonverbal, therefore all history is collected from nursing staff at his facility by phone.  Evidently, the patient has been having a "cold" with cough for last week, steadily worsening. Tonight when the third shift arrived, they checked on him as per routine, found him with food all over himself from dinner, stood him up and changed his diaper, but could tell that he was extremely weak and barely able to stand, and shaking "real bad", and finally collapsed back on the bed. In addition he was "coughing real bad", and seemed "not him self", and was less responsive than usual, so I asked for EMS to take him to the ER.  EMS found him lethargic, congested, hypoxic to 88% on room air.  ED course: -Hypothermic, heart rate 50s, respirations 20s, pulse oximetry mid 80s on room air, blood pressure normal -Na 144, K 4.7, Cr 1.68 (baseline 1.8-2), WBC 5.8K, Hgb 11.1, normocytic stable -UA unremarkable -CXR showed atelectasis vs opacity in the right base -Blood cultures were obtained and warmed fluids were administered and his clinical status improved to what appeared normal and he was given ceftriaxone and azithromycin for CAP and TRH were asked to evaluate   He has been admitted twice in the past year in similar circumstances ("not his usual self" from facility).  The first time in January he had mild AKI, and improved with fluids.  The second time in March he was hypothermic as now but without any focal signs of infection and his TSH, cortisol, lactate were normal as were flu swab and  so antibiotics were held, he was given fluids and he returned completely to normal with just supportive cares.    ROS: Review of Systems  Unable to perform ROS: Patient nonverbal          Past Medical History:  Diagnosis Date  . Dementia 06/19/2014  . DVT (deep venous thrombosis) (HCC)    right leg  . Dyslipidemia   . Hypertension   . Mental retardation   . Obesity   . Other and unspecified hyperlipidemia 06/19/2014    History reviewed. No pertinent surgical history.  Social History: Patient lives in assisted living facility.  The patient walks unassisted and can participate in ADLs per staff.  He does not smoke.    No Known Allergies  Family history: family history is not on file. and cannot obtain from patient  Prior to Admission medications   Medication Sig Start Date End Date Taking? Authorizing Provider  ARIPiprazole (ABILIFY) 30 MG tablet Take 30 mg by mouth daily.   Yes Historical Provider, MD  Cholecalciferol (VITAMIN D3) 1000 UNITS CAPS Take 1 capsule by mouth 2 (two) times daily.     Yes Historical Provider, MD  donepezil (ARICEPT) 10 MG tablet Take 10 mg by mouth at bedtime.    Yes Historical Provider, MD  ferrous sulfate 325 (65 FE) MG tablet Take 325 mg by mouth daily with breakfast.     Yes Historical Provider, MD  furosemide (LASIX) 20 MG tablet Take 1  tablet (20 mg total) by mouth daily. Patient taking differently: Take 40 mg by mouth 2 (two) times daily.  10/28/15  Yes Zannie Cove, MD  gabapentin (NEURONTIN) 100 MG capsule Take 100 mg by mouth at bedtime.     Yes Historical Provider, MD  LORazepam (ATIVAN) 0.5 MG tablet Take 1 tablet (0.5 mg total) by mouth every 4 (four) hours as needed (for agitation). 06/13/15  Yes Penny Pia, MD  memantine (NAMENDA) 10 MG tablet Take 10 mg by mouth 2 (two) times daily.     Yes Historical Provider, MD  potassium chloride (K-DUR,KLOR-CON) 10 MEQ tablet Take 10 mEq by mouth daily.   Yes Historical Provider, MD  risperiDONE  (RISPERDAL) 1 MG tablet Take 1 mg by mouth at bedtime. Reported on 10/25/2015   Yes Historical Provider, MD  risperiDONE (RISPERDAL) 2 MG tablet Take 2 mg by mouth daily with breakfast.   Yes Historical Provider, MD  rivaroxaban (XARELTO) 20 MG TABS tablet Take 1 tablet (20 mg total) by mouth daily with supper. 06/22/15  Yes Penny Pia, MD       Physical Exam: BP 129/92   Pulse 80   Resp 25   SpO2 93%  General appearance: Well-developed, adult male, alert and in no acute distress.  Appears curious and alert.  Nonverbal.   Eyes: Anicteric, conjunctiva pink, lids and lashes normal. PERRL.    ENT: No nasal deformity, discharge, epistaxis.  Hearing unable to assess. OP moist without lesions.   Neck: No neck masses.  Trachea midline.  No thyromegaly/tenderness. Lymph: No cervical or supraclavicular lymphadenopathy. Skin: Warm and dry.  No jaundice.  No suspicious rashes or lesions. Cardiac: RRR, nl S1-S2, no murmurs appreciated.  Capillary refill is brisk.  JVP normal.  No LE edema.  Radial and DP pulses 2+ and symmetric. Respiratory: Normal respiratory rate and rhythm.  No wheezes.  Coarse breath sounds, dimininsed at bases. Abdomen: Abdomen soft.  No TTP. No ascites, distension, hepatosplenomegaly.   MSK: No deformities or effusions.  No cyanosis or clubbing. Neuro: Does not follow commands well.  Alert and gesturing for things (like my name badge, which he inspects, the call light, which he examines).  Nonverbal.  Moves both arms spontaneously. Psych: Unable to assess.     Labs on Admission:  I have personally reviewed following labs and imaging studies: CBC:  Recent Labs Lab 08/22/16 0049  WBC 5.8  NEUTROABS 4.2  HGB 11.1*  HCT 34.0*  MCV 84.6  PLT 114*   Basic Metabolic Panel:  Recent Labs Lab 08/22/16 0125  NA 144  K 4.7  CL 109  CO2 26  GLUCOSE 123*  BUN 44*  CREATININE 1.68*  CALCIUM 9.6   GFR: CrCl cannot be calculated (Unknown ideal weight.).  Liver  Function Tests:  Recent Labs Lab 08/22/16 0125  AST 42*  ALT 44  ALKPHOS 83  BILITOT 0.4  PROT 7.5  ALBUMIN 3.3*   No results for input(s): LIPASE, AMYLASE in the last 168 hours. No results for input(s): AMMONIA in the last 168 hours. Coagulation Profile: No results for input(s): INR, PROTIME in the last 168 hours. Cardiac Enzymes: No results for input(s): CKTOTAL, CKMB, CKMBINDEX, TROPONINI in the last 168 hours. BNP (last 3 results) No results for input(s): PROBNP in the last 8760 hours. HbA1C: No results for input(s): HGBA1C in the last 72 hours. CBG:  Recent Labs Lab 08/22/16 0119  GLUCAP 97   Lipid Profile: No results for input(s): CHOL, HDL, LDLCALC, TRIG,  CHOLHDL, LDLDIRECT in the last 72 hours. Thyroid Function Tests:  Recent Labs  08/22/16 0220  TSH 5.377*   Anemia Panel: No results for input(s): VITAMINB12, FOLATE, FERRITIN, TIBC, IRON, RETICCTPCT in the last 72 hours. Sepsis Labs: Lactic acid 2.11 Invalid input(s): PROCALCITONIN, LACTICIDVEN No results found for this or any previous visit (from the past 240 hour(s)).       Radiological Exams on Admission: Personally reviewed CXR appaers to me to shows opacity in the right base: Dg Chest Portable 1 View  Result Date: 08/22/2016 CLINICAL DATA:  Sepsis. EXAM: PORTABLE CHEST 1 VIEW COMPARISON:  12/09/2015 FINDINGS: Shallow inspiration. Atelectasis in the lung bases. Normal heart size and pulmonary vascularity. No developing consolidation. No blunting of costophrenic angles. No pneumothorax. Calcified and tortuous aorta. IMPRESSION: Shallow inspiration with persistent atelectasis in the lung bases similar previous study. No developing consolidation. Electronically Signed   By: Burman NievesWilliam  Stevens M.D.   On: 08/22/2016 01:43    EKG: Independently reviewed. Rate 71, QTc 476, no ST changes.  Echocardiogram Mar 2017: EF 55-60% Grade I DD Mild LVF    Assessment/Plan  1. Sepsis from CAP:  His worsening  cough, hypothermia and hypoxia suggest pneumonia.  Strictly speaking, patient meets sepsis criteria given tachypnea, hypothermia, and evidence of organ dysfunction.  Lactate 2.11 mmol/L and repeat ordered within 6 hours.  This patient is not at high risk of poor outcomes with a qSOFA score of 1.  Antibiotics delivered in the ED.    -Sepsis bundle utilized:  -Blood and urine cultures drawn  -Start targeted antibiotics with ceftriaxone and azithromycin, based on suspected source of infection    -Repeat renal function and complete blood count in AM  -Code SEPSIS called to E-link  -Check RVP and procalcitonin and low threshold to de-escalate as able       2. DVT:  History of DVT.  On Xarelto. -Continue Xarelto  3. CKD:  Stable.  4. Anemia:  Stable. -Continue iron  5. 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 HFpEF:  -Hold furosemide and K for tonight, restart Monday        DVT prophylaxis: N/A on xarelto  Code Status: FULL  Family Communication: None present  Disposition Plan: Anticipate IV fluids and antibiotics and follow ancillary studies.  Expect 2-3 days treatment for pneumonia with early sepsis. Consults called: None Admission status: INPATIENT, med surg        Medical decision making: Patient seen at 2:50 AM on 08/22/2016.  The patient was discussed with Dr. Wilkie AyeHorton.  What exists of the patient's chart was reviewed in depth and summarized above.  Clinical condition: stable from respiratory standpoint by the time of my evaluation in the ER, hemodynamically stable.           Alberteen SamChristopher P Elyana Grabski [facility] Pager [phone]      At the time of admission, it appears that the appropriate admission status for this patient is INPATIENT. This is judged to be reasonable and necessary in order to provide the required intensity of service to ensure the patient's safety given the presenting symptoms,  physical exam findings, and initial radiographic and laboratory data in the context of their chronic comorbidities.  Together, these circumstances are felt to place her/him at high risk for further clinical deterioration threatening life, limb, or organ. The following factors support the admission status of inpatient:   A. The patient's presenting symptoms include lethargy, cough, weakness, decreased responsiveness B. The worrisome  physical exam findings include hypothermia C. The initial radiographic and laboratory data are worrisome because of pneumonia on chest x-ray, elevated lactic acid  D. The chronic co-morbidities include mental retardation, DVT on anticoagulation, chronic kidney disease, chronic diastolic CHF E. Patient requires inpatient status due to high intensity of service, high risk for further deterioration and high frequency of surveillance required because of this acute illness that poses a threat to life or bodily function. F. I certify that at the point of admission it is my clinical judgment that the patient will require inpatient hospital care spanning beyond 2 midnights from the point of admission and that early discharge would result in unnecessary risk of decompensation and readmission or threat to life, limb or bodily function.

## 2016-08-22 NOTE — Significant Event (Signed)
Rapid Response Event Note  Overview:  Called by Rn for decreasing sats, and low BP Time Called: 1449 Arrival Time: 1452 Event Type: Respiratory, Hypotension  Initial Focused Assessment: Called by Rn for decreasing sats, now requiring NRB and low BP.  On my arrival to patients room, RN and Rt at bedside.  PAteint on NRB 92% on NRB, RT suctioning patient.  Breath sounds crackles.  BP 91/57   Interventions:  Suctioning by Rt.  Dr. Sharon SellerMcClung at bedside.  PCXR ordered.   Plan of Care (if not transferred):   Rn to monitor.  Event Summary:  Rn to call if assistance needed   at      at          [facility] HealthWolfe, Maryagnes Amosenise Ann

## 2016-08-22 NOTE — Progress Notes (Signed)
Patient's O2 saturation was 81% sustained on 100% non-rebreather with audible ronchi. Gave scheduled IV lasix per Md. Patient's blood pressure dropped to 56/43. Sharon SellerMcClung, Md was made aware.  Curtis SitesHayley H Tierrah Anastos, RN

## 2016-08-22 NOTE — ED Notes (Signed)
Rectal Temp 87.3

## 2016-08-22 NOTE — ED Notes (Signed)
Portable x-ray in room at this time. 

## 2016-08-22 NOTE — Progress Notes (Signed)
Patient restless, sats in 80s, RT called. Discussed relieving anxiety. Gave patient 1mg  Ativan. Sats dropped to 70s. 2mg  morphine given for air hunger. RT called again when sats did not rise.  MD notified of sats and lowered BP of 50s/40s. Discussed that patient is not a candidate for Bipap as he cannot control secretions. RT tried NTS and now nasal trumpet without success. Sats in mid-high 80s. Will continue to monitor patient.

## 2016-08-22 NOTE — Progress Notes (Signed)
Patient's O2 saturation decreased to 82% and maintained on 15L high flow nasal cannula. Placed patient on non rebreather at 100%. RRT present. Dr. Sharon SellerMcClung at bedside assessing patient. Orders to be anticipated.  Curtis SitesHayley H Kimbria Camposano, RN

## 2016-08-22 NOTE — Progress Notes (Signed)
Pharmacy Antibiotic Note  [name] is a 65 y.o. male admitted on 08/22/2016 with pneumonia.  Pharmacy has been consulted for vancomycin dosing. Antibiotic renal adjustment consult also entered. -Borderline CrCl ~50 (currently 49.4, but has been getting better over last few days)  Plan: Vancomycin 2000 mg x1 load, then 1500mg  Q24H. Goal trough 15-20. Cefepime 1g Q12H due to the borderline CrCl Monitor renal function and cultures Vanc level as needed at steady state  Height: 6' (182.9 cm) Weight: 201 lb 1 oz (91.2 kg) IBW/kg (Calculated) : 77.6  Temp (24hrs), Avg:95.8 F (35.4 C), Min:91.3 F (32.9 C), Max:99.7 F (37.6 C)   Recent Labs Lab 08/22/16 0049 08/22/16 0123 08/22/16 0125 08/22/16 0411 08/22/16 0700  WBC 5.8  --   --   --   --   CREATININE  --   --  1.68*  --   --   LATICACIDVEN  --  2.11*  --  0.92 1.1    Estimated Creatinine Clearance: 49.4 mL/min (by C-G formula based on SCr of 1.68 mg/dL (H)).    No Known Allergies  Antimicrobials this admission: CTX 11/12 >> 11/12 Azithromycin 11/12 >> 11/12 Vanc 11/12 >> Cefepime 11/12 >>  Dose adjustments this admission: N/A  Microbiology results: 11/12 BCx: sent 11/12 UCx: sent 11/12 MRSA PCR: negative 11/12 Respiratory Panel: Rhinovirus/Enterovirus  Thank you for allowing pharmacy to be a part of this patient's care.  Gwyndolyn KaufmanKai Tiwatope Emmitt Bernette Redbird(Kenny), PharmD  PGY1 Pharmacy Resident Pager: [phone] 08/22/2016 3:29 PM

## 2016-08-22 NOTE — ED Provider Notes (Signed)
[facility]-EMERGENCY DEPT Provider Note   CSN: [id number] Arrival date & time: 08/22/16  0030  By signing my name below, I, Phillis HaggisGabriella Gaje, attest that this documentation has been prepared under the direction and in the presence of Shon Batonourtney F Yobani Schertzer, MD. Electronically Signed: Phillis HaggisGabriella Gaje, ED Scribe. 08/22/16. 1:10 AM.  History   Chief Complaint Chief Complaint  Patient presents with  . Cold Exposure    hyportermia  . Weakness  . Cough   The history is provided by the EMS personnel and the nursing home. No language interpreter was used.   HPI Comments (Level 5 Caveat due to MR): [name] is a 65 y.o. male with a hx of dementia, DVT, MR, and HTN brought in by EMS from [facility] who presents to the Emergency Department complaining of gradually worsening cough onset one week ago. Nursing home staff reports that pt has been lethargic, weak, and congested over the past week. Per nurse, pt is reported to typically more upbeat in nature. Pt has a 20 gauge in the left wrist. Pt was 88% on RA. He improved to 95% on 4 L via nasal cannula. Pt is on Xarelto. Pt does not complain of pain.   Past Medical History:  Diagnosis Date  . Dementia 06/19/2014  . DVT (deep venous thrombosis) (HCC)    right leg  . Dyslipidemia   . Hypertension   . Mental retardation   . Obesity   . Other and unspecified hyperlipidemia 06/19/2014    Patient Active Problem List   Diagnosis Date Noted  . Hypothermia 12/09/2015  . FTT (failure to thrive) in adult 12/09/2015  . Left ventricular diastolic dysfunction, NYHA class 1 12/09/2015  . Mild aortic stenosis 12/09/2015  . Failure to thrive in adult 12/09/2015  . Absolute anemia   . Dehydration 10/25/2015  . Acute kidney injury (HCC) 10/25/2015  . Viral URI 10/25/2015  . AKI (acute kidney injury) (HCC) 10/25/2015  . Conjunctivitis 10/25/2015  . DVT (deep venous thrombosis) (HCC) 06/11/2015  . CKD (chronic kidney disease) stage 3, GFR 30-59 ml/min 06/11/2015    . Mental retardation 06/11/2015  . History of DVT (deep vein thrombosis)   . Anemia 06/19/2014  . Dementia 06/19/2014  . HLD (hyperlipidemia) 06/19/2014  . Leg edema 07/09/2011  . Hypertension     History reviewed. No pertinent surgical history.   Home Medications    Prior to Admission medications   Medication Sig Start Date End Date Taking? Authorizing Provider  ARIPiprazole (ABILIFY) 30 MG tablet Take 30 mg by mouth daily.   Yes Historical Provider, MD  Cholecalciferol (VITAMIN D3) 1000 UNITS CAPS Take 1 capsule by mouth 2 (two) times daily.     Yes Historical Provider, MD  donepezil (ARICEPT) 10 MG tablet Take 10 mg by mouth at bedtime.    Yes Historical Provider, MD  ferrous sulfate 325 (65 FE) MG tablet Take 325 mg by mouth daily with breakfast.     Yes Historical Provider, MD  furosemide (LASIX) 20 MG tablet Take 1 tablet (20 mg total) by mouth daily. Patient taking differently: Take 40 mg by mouth 2 (two) times daily.  10/28/15  Yes Zannie CovePreetha [name], MD  gabapentin (NEURONTIN) 100 MG capsule Take 100 mg by mouth at bedtime.     Yes Historical Provider, MD  LORazepam (ATIVAN) 0.5 MG tablet Take 1 tablet (0.5 mg total) by mouth every 4 (four) hours as needed (for agitation). 06/13/15  Yes Penny Piarlando Vega, MD  memantine (NAMENDA) 10 MG tablet  Take 10 mg by mouth 2 (two) times daily.     Yes Historical Provider, MD  potassium chloride (K-DUR,KLOR-CON) 10 MEQ tablet Take 10 mEq by mouth daily.   Yes Historical Provider, MD  risperiDONE (RISPERDAL) 1 MG tablet Take 1 mg by mouth at bedtime. Reported on 10/25/2015   Yes Historical Provider, MD  risperiDONE (RISPERDAL) 2 MG tablet Take 2 mg by mouth daily with breakfast.   Yes Historical Provider, MD  rivaroxaban (XARELTO) 20 MG TABS tablet Take 1 tablet (20 mg total) by mouth daily with supper. 06/22/15  Yes Penny Piarlando Vega, MD    Family History Family History  Problem Relation Age of Onset  . Hypertension      Social History Social  History  Substance Use Topics  . Smoking status: Never Smoker  . Smokeless tobacco: Never Used  . Alcohol use No     Allergies   Patient has no known allergies.  Review of Systems Review of Systems  Unable to perform ROS: Other  Mental retardation  Physical Exam Updated Vital Signs BP 129/92   Pulse 80   Resp 25   SpO2 93%   Physical Exam  Constitutional: He appears well-developed and well-nourished.  HENT:  Head: Normocephalic and atraumatic.  Cardiovascular: Normal rate, regular rhythm and normal heart sounds.   Pulmonary/Chest: Effort normal and breath sounds normal. No respiratory distress.  Crackles bilaterally  Abdominal: Soft. Bowel sounds are normal. There is no tenderness. There is no rebound.  Musculoskeletal: He exhibits no edema.  Neurological: He is alert.  Answer simple questions, moves all 4 extremities  Skin: Skin is warm and dry.  Nursing note and vitals reviewed.    ED Treatments / Results  DIAGNOSTIC STUDIES: Oxygen Saturation is 90% on 3 L O2, low by my interpretation.    COORDINATION OF CARE: 1:10 AM-labs and x-ray   Labs (all labs ordered are listed, but only abnormal results are displayed) Labs Reviewed  CBC WITH DIFFERENTIAL/PLATELET - Abnormal; Notable for the following:       Result Value   RBC 4.02 (*)    Hemoglobin 11.1 (*)    HCT 34.0 (*)    RDW 19.2 (*)    Platelets 114 (*)    All other components within normal limits  COMPREHENSIVE METABOLIC PANEL - Abnormal; Notable for the following:    Glucose, Bld 123 (*)    BUN 44 (*)    Creatinine, Ser 1.68 (*)    Albumin 3.3 (*)    AST 42 (*)    GFR calc non Af Amer 42 (*)    GFR calc Af Amer 48 (*)    All other components within normal limits  I-STAT CG4 LACTIC ACID, ED - Abnormal; Notable for the following:    Lactic Acid, Venous 2.11 (*)    All other components within normal limits  CULTURE, BLOOD (ROUTINE X 2)  CULTURE, BLOOD (ROUTINE X 2)  URINE CULTURE  URINALYSIS,  ROUTINE W REFLEX MICROSCOPIC (NOT AT [facility])  TSH  CBG MONITORING, ED    EKG  EKG Interpretation  Date/Time:  Sunday August 22 2016 02:06:51 EST Ventricular Rate:  71 PR Interval:    QRS Duration: 112 QT Interval:  438 QTC Calculation: 476 R Axis:   77 Text Interpretation:  Sinus rhythm Prolonged PR interval Left atrial enlargement Borderline intraventricular conduction delay Borderline T wave abnormalities Borderline prolonged QT interval Confirmed by Wilkie AyeHORTON  MD, Amaziah Ghosh ([id number]) on 08/22/2016 2:10:01 AM  Radiology Dg Chest Portable 1 View  Result Date: 08/22/2016 CLINICAL DATA:  Sepsis. EXAM: PORTABLE CHEST 1 VIEW COMPARISON:  12/09/2015 FINDINGS: Shallow inspiration. Atelectasis in the lung bases. Normal heart size and pulmonary vascularity. No developing consolidation. No blunting of costophrenic angles. No pneumothorax. Calcified and tortuous aorta. IMPRESSION: Shallow inspiration with persistent atelectasis in the lung bases similar previous study. No developing consolidation. Electronically Signed   By: Burman Nieves M.D.   On: 08/22/2016 01:43    Procedures Procedures (including critical care time)  Medications Ordered in ED Medications  azithromycin (ZITHROMAX) 500 mg in dextrose 5 % 250 mL IVPB (500 mg Intravenous New Bag/Given 08/22/16 0242)  cefTRIAXone (ROCEPHIN) 1 g in dextrose 5 % 50 mL IVPB (1 g Intravenous New Bag/Given 08/22/16 0237)  sodium chloride 0.9 % bolus 1,000 mL (0 mLs Intravenous Stopped 08/22/16 0222)     Initial Impression / Assessment and Plan / ED Course  I have reviewed the triage vital signs and the nursing notes.  Pertinent labs & imaging results that were available during my care of the patient were reviewed by me and considered in my medical decision making (see chart for details).  Clinical Course     Patient presents from living facility. Reported decreased activity and cough. Found to be profoundly hypothermic. This was  confirmed twice. Sepsis protocol initiated. Appears to be his baseline. Course and crackly breath sounds bilaterally. Mild hypoxia to 88%. Otherwise vital signs reassuring. Lactate 2.11. Patient was given 1 L of warm fluids. Otherwise his lab workup is largely reassuring. Chest x-ray does not show a pneumonia however given hypoxia, hypothermia, and cough, will treat with Rocephin and azithromycin. TSH pending. Glucose reassuring. Will admit to the hospitalist for further management.  Final Clinical Impressions(s) / ED Diagnoses   Final diagnoses:  Hypothermia, initial encounter  Community acquired pneumonia, unspecified laterality   I personally performed the services described in this documentation, which was scribed in my presence. The recorded information has been reviewed and is accurate.   New Prescriptions New Prescriptions   No medications on file     Shon Baton, MD 08/22/16 [phone]

## 2016-08-22 NOTE — ED Notes (Signed)
Spoke with Dr. Maryfrances Bunnellanford. Patient is to be held in ED for an hour & then recheck rectal temp. Will notify hospitalist at 0500 of rectal temp.

## 2016-08-22 NOTE — Progress Notes (Signed)
Tried patient with a sip of water. Patient was unable to swallow water and held it in his mouth. MD made aware.  Curtis SitesHayley H Shawnn Bouillon, RN

## 2016-08-22 NOTE — Progress Notes (Signed)
Pharmacy Antibiotic Note  [name] is a 65 y.o. male admitted on 08/22/2016 with pneumonia.  Pharmacy has been consulted for Ceftriaxone/Azithromycin dosing. WBC WNL. Noted renal dysfunction. Pt hypothermic.   Plan: -Ceftriaxone 1g IV q24h -Azithromycin 500 mg IV q24h -Trend WBC, temp  Height: 6' (182.9 cm) Weight: 201 lb 1 oz (91.2 kg) IBW/kg (Calculated) : 77.6  Temp (24hrs), Avg:91.3 F (32.9 C), Min:91.3 F (32.9 C), Max:91.3 F (32.9 C)   Recent Labs Lab 08/22/16 0049 08/22/16 0123 08/22/16 0125 08/22/16 0411  WBC 5.8  --   --   --   CREATININE  --   --  1.68*  --   LATICACIDVEN  --  2.11*  --  0.92    Estimated Creatinine Clearance: 49.4 mL/min (by C-G formula based on SCr of 1.68 mg/dL (H)).    No Known Allergies    Abran DukeLedford, Lilla Callejo 08/22/2016 6:11 AM

## 2016-08-22 NOTE — ED Triage Notes (Signed)
Patient arrived to ED via [facility]. EMS reports: Patient is a resident [facility]. Patient has MR.  Staff reports patient lethargic, weak and congested with cough x 1 week\. EMS called.  EMS reports lungs diminished with crackles noted.  20 gauge L wrist.  BP 126/84, Pulse 60's, 88% on room air. 95% on 4 LPM via nasal cannula.  Patient takes Xarelto.

## 2016-08-22 NOTE — Progress Notes (Signed)
  [place] TEAM 1 - Stepdown/ICU TEAM  The patient has been suffering with refractory hypotension throughout the day.  He has required multiple boluses to which his blood pressure seems to respond initially, only to later develop hypotension again.  Now after aggressive volume resuscitation he has developed worsening hypoxic respiratory failure.  He is having significant difficulty managing his own secretions.  He has been titrated to near max dose of oxygen support via noninvasive means.  I was able to get in touch with the patient's guardian Ms Mart Piggsarnestine Howell at [phone].  She confirmed to me that she is his guardian and the only one able to speak on his behalf.  I described the critical nature of his current illness and explained my concern that he likely would not survive this illness.  I explained to her that I did not feel that intubation/mechanical ventilation or similar highly aggressive measures would likely change his outcome.  I also explained to her that I did not feel that these aggressive measures were appropriate given his inability to comprehend the potential justification for the suffering they would bring about.  Ms. Providence LaniusHowell agreed that no CODE BLUE/DO NOT RESUSCITATE status was most appropriate in his case.  We both agreed that short of this we would continue with aggressive medical therapy in hopes of affecting an improvement.  Lonia BloodJeffrey T. Terrye Dombrosky, MD [facility] Office  [phone] Pager - Text Page per Amion as per below:  On-Call/Text Page:      [email or web address]      password [email or web address]  If 7PM-7AM, please contact night-coverage [email or web address] Password [email or web address] 08/22/2016, 3:27 PM

## 2016-08-22 NOTE — Progress Notes (Signed)
Patient coughing but unable to cough up secretions heart rate jumps to the 140s and respiratory rate jumps to the 30s. Nasal suctioned twice and heart and respiratory rate lowers and then comes back up a few minutes later. Md made aware.  Curtis SitesHayley H Rayven Rettig, RN

## 2016-08-22 NOTE — Progress Notes (Signed)
[name] TEAM 1 - Stepdown/ICU TEAM  Royston Bakernest [name]  VWU:[id number] DOB: 02/20/1951 DOA: 08/22/2016 PCP: Colon BranchQURESHI, AYYAZ, MD    Brief Narrative:  65 y.o. male with a history of severe mental handicap (nonverbal at baseline), HTN, DVT on Xarelto, CKD and mild diastolic CHF who presented with cough and severe weakness.  EMS found him lethargic, congested, and hypoxic at 88% on room air.  In the ED he was found to be hypothermic and CXR showed atelectasis vs opacity in the right base  He has been admitted twice in the past year under similar circumstances.  The first time in January he had mild AKI, and improved with fluids.  The second time in March he was hypothermic but without any focal signs of infection and his TSH, cortisol, and lactate were normal as was a flu swab.  He was given fluids and he returned completely to normal with just supportive care.  Subjective: Pt is seen for a f/u visit.    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 rate (!) 27, height 6' (1.829 m), weight 91.2 kg (201 lb 1 oz), SpO2 92 %.  Intake/Output Summary (Last 24 hours) at 08/22/16 1003 Last data filed at 08/22/16 0730  Gross per 24 hour  Intake             1550 ml  Output              500 ml  Net             1050 ml   Filed Weights   08/22/16 0550  Weight: 91.2 kg (201 lb 1 oz)    Examination: Pt was seen for a f/u visit.    CBC:  Recent Labs Lab 08/22/16 0049  WBC 5.8  NEUTROABS  4.2  HGB 11.1*  HCT 34.0*  MCV 84.6  PLT 114*   Basic Metabolic Panel:  Recent Labs Lab 08/22/16 0125  NA 144  K 4.7  CL 109  CO2 26  GLUCOSE 123*  BUN 44*  CREATININE 1.68*  CALCIUM 9.6   GFR: Estimated Creatinine Clearance: 49.4 mL/min (by C-G formula based on SCr of 1.68 mg/dL (H)).  Liver Function Tests:  Recent Labs Lab 08/22/16 0125  AST 42*  ALT 44  ALKPHOS 83  BILITOT 0.4  PROT 7.5  ALBUMIN 3.3*    CBG:  Recent Labs Lab 08/22/16 0119  GLUCAP 97    Recent Results (from the past 240 hour(s))  Respiratory Panel by PCR     Status: Abnormal   Collection Time: 08/22/16  6:00 AM  Result Value Ref Range Status   Adenovirus NOT DETECTED NOT DETECTED Final   Coronavirus 229E NOT DETECTED NOT DETECTED Final   Coronavirus HKU1 NOT DETECTED NOT DETECTED Final   Coronavirus NL63 NOT DETECTED NOT DETECTED Final   Coronavirus OC43 NOT DETECTED NOT DETECTED Final   Metapneumovirus NOT DETECTED NOT DETECTED Final   Rhinovirus / Enterovirus DETECTED (A) NOT DETECTED Final  Influenza A NOT DETECTED NOT DETECTED Final   Influenza B NOT DETECTED NOT DETECTED Final   Parainfluenza Virus 1 NOT DETECTED NOT DETECTED Final   Parainfluenza Virus 2 NOT DETECTED NOT DETECTED Final   Parainfluenza Virus 3 NOT DETECTED NOT DETECTED Final   Parainfluenza Virus 4 NOT DETECTED NOT DETECTED Final   Respiratory Syncytial Virus NOT DETECTED NOT DETECTED Final   Bordetella pertussis NOT DETECTED NOT DETECTED Final   Chlamydophila pneumoniae NOT DETECTED NOT DETECTED Final   Mycoplasma pneumoniae NOT DETECTED NOT DETECTED Final  MRSA PCR Screening     Status: None   Collection Time: 08/22/16  6:00 AM  Result Value Ref Range Status   MRSA by PCR NEGATIVE NEGATIVE Final    Comment:        The GeneXpert MRSA Assay (FDA approved for NASAL specimens only), is one component of a comprehensive MRSA colonization surveillance program. It is not intended to diagnose MRSA infection  nor to guide or monitor treatment for MRSA infections.      Scheduled Meds: . acetylcysteine  4 mL Nebulization TID  . azithromycin  500 mg Intravenous Q24H  . cefTRIAXone (ROCEPHIN)  IV  1 g Intravenous Q24H  . enoxaparin (LOVENOX) injection  40 mg Subcutaneous Q24H  . mouth rinse  15 mL Mouth Rinse BID  . sodium chloride  500 mL Intravenous Once   Continuous Infusions: . sodium chloride 125 mL/hr at 08/22/16 0600     LOS: 0 days   Time spent: No Charge  Lonia BloodJeffrey T. Kaylob Wallen, MD [facility] Office  [phone] Pager - Text Page per Loretha StaplerAmion as per below:  On-Call/Text Page:      [email or web address]      password [email or web address]  If 7PM-7AM, please contact night-coverage [email or web address] Password [email or web address] 08/22/2016, 10:03 AM

## 2016-08-22 NOTE — Progress Notes (Signed)
Spoke with Dr. Sharon SellerMcClung regarding desaturation with turning for cleaning (86% with good wave form.and rhonchi to left lung anteriorly, possibly on right or maybe referred from left).  RRT present for treatments.

## 2016-08-22 NOTE — ED Notes (Signed)
Dr. Danford at bedside at this time.  °

## 2016-08-22 NOTE — ED Notes (Signed)
Rechecked rectal temp 87.8

## 2016-08-23 ENCOUNTER — Inpatient Hospital Stay (HOSPITAL_COMMUNITY): Payer: Medicare Other

## 2016-08-23 LAB — URINE CULTURE: Culture: NO GROWTH

## 2016-08-23 LAB — COMPREHENSIVE METABOLIC PANEL
ALBUMIN: 2.4 g/dL — AB (ref 3.5–5.0)
ALT: 39 U/L (ref 17–63)
AST: 37 U/L (ref 15–41)
Alkaline Phosphatase: 67 U/L (ref 38–126)
Anion gap: 7 (ref 5–15)
BUN: 47 mg/dL — AB (ref 6–20)
CHLORIDE: 115 mmol/L — AB (ref 101–111)
CO2: 25 mmol/L (ref 22–32)
Calcium: 8.4 mg/dL — ABNORMAL LOW (ref 8.9–10.3)
Creatinine, Ser: 2.57 mg/dL — ABNORMAL HIGH (ref 0.61–1.24)
GFR calc Af Amer: 29 mL/min — ABNORMAL LOW (ref >60)
GFR calc non Af Amer: 25 mL/min — ABNORMAL LOW (ref >60)
GLUCOSE: 79 mg/dL (ref 65–99)
POTASSIUM: 5.4 mmol/L — AB (ref 3.5–5.1)
Sodium: 147 mmol/L — ABNORMAL HIGH (ref 135–145)
Total Bilirubin: 0.7 mg/dL (ref 0.3–1.2)
Total Protein: 6 g/dL — ABNORMAL LOW (ref 6.5–8.1)

## 2016-08-23 LAB — CBC
HEMATOCRIT: 30.1 % — AB (ref 39.0–52.0)
Hemoglobin: 9.9 g/dL — ABNORMAL LOW (ref 13.0–17.0)
MCH: 27.9 pg (ref 26.0–34.0)
MCHC: 32.9 g/dL (ref 30.0–36.0)
MCV: 84.8 fL (ref 78.0–100.0)
Platelets: 93 10*3/uL — ABNORMAL LOW (ref 150–400)
RBC: 3.55 MIL/uL — ABNORMAL LOW (ref 4.22–5.81)
RDW: 19.7 % — AB (ref 11.5–15.5)
WBC: 17.3 10*3/uL — ABNORMAL HIGH (ref 4.0–10.5)

## 2016-08-23 LAB — HIV ANTIBODY (ROUTINE TESTING W REFLEX): HIV Screen 4th Generation wRfx: NONREACTIVE

## 2016-08-23 LAB — T4, FREE: FREE T4: 0.81 ng/dL (ref 0.61–1.12)

## 2016-08-23 MED ORDER — SODIUM CHLORIDE 0.45 % IV SOLN
INTRAVENOUS | Status: DC
  Administered 2016-08-23: 12:00:00 via INTRAVENOUS
  Administered 2016-08-24: 50 mL/h via INTRAVENOUS

## 2016-08-23 MED ORDER — DEXTROSE 5 % IV SOLN
1.0000 g | INTRAVENOUS | Status: AC
  Administered 2016-08-24 – 2016-08-28 (×6): 1 g via INTRAVENOUS
  Filled 2016-08-23 (×8): qty 1

## 2016-08-23 MED ORDER — LORAZEPAM 2 MG/ML IJ SOLN
0.5000 mg | INTRAMUSCULAR | Status: DC | PRN
  Administered 2016-09-04 – 2016-09-07 (×3): 1 mg via INTRAVENOUS
  Filled 2016-08-23 (×3): qty 1

## 2016-08-23 MED ORDER — VANCOMYCIN HCL 10 G IV SOLR
1250.0000 mg | INTRAVENOUS | Status: DC
  Administered 2016-08-23: 1250 mg via INTRAVENOUS
  Filled 2016-08-23 (×2): qty 1250

## 2016-08-23 NOTE — Progress Notes (Addendum)
[facility] TEAM 1 - Stepdown/ICU TEAM  Royston Bakernest Wanek  ZOX:[id number] DOB: 03/24/1951 DOA: 08/22/2016 PCP: Colon BranchQURESHI, AYYAZ, MD    Brief Narrative:  65 y.o. male with a history of severe mental handicap (nonverbal at baseline), HTN, DVT on Xarelto, CKD and mild diastolic CHF who presented with cough and severe weakness.  EMS found him lethargic, congested, and hypoxic at 88% on room air.  In the ED he was found to be hypothermic and CXR showed atelectasis vs opacity in the right base  He has been admitted twice in the past year under similar circumstances.  The first time in January he had mild AKI, and improved with fluids.  The second time in March he was hypothermic but without any focal signs of infection and his TSH, cortisol, and lactate were normal as was a flu swab.  He was given fluids and he returned completely to normal with just supportive care.  Subjective: The patient is unresponsive.  There is no evidence of severe air hunger or uncontrolled pain.  He struggled with persistent hypotension and hypoxia last night.  At the present time his hypoxia appears to have improved to an extent but he remains hypotensive.  Assessment & Plan:  Septic shock due to Pneumonia +/- Rhinovirus pneumonitis  Clinically remains in a precarious situation with persisting hypotension and intermittent severe hypoxemia - continue conservative medical care as per my discussion with his power of attorney - not a candidate for BiPAP given his heavy secretions and his inability to control them on his own - admin med tx to assure patient not anxious or suffering with uncontrolled pain or air hunger  Acute kidney failure on CKD stage 3  Baseline crt ~2 - creatinine climbing in setting of severe sepsis/septic shock  Chronic DVT On Xarelto as outpt - presently on full dose Lovenox  HTN Not an active problem at this time  Anemia   Mental handicpa with behavioral disturbance / dementia   DVT prophylaxis: full  dose lovenox  Code Status: FULL CODE Family Communication: no family present at time of exam  Disposition Plan: SDU  Consultants:  none  Procedures: none  Antimicrobials:  Azithromycin 11/11 > 11/12 Ceftriaxone 11/11 > 11/12 Maxipime 11/12 > Vanc 11/12 >  Objective: Blood pressure (!) 78/54, pulse 94, temperature 99.8 F (37.7 C), temperature source Axillary, resp. rate (!) 22, height 6' (1.829 m), weight 91.2 kg (201 lb 1 oz), SpO2 92 %.  Intake/Output Summary (Last 24 hours) at 08/23/16 1114 Last data filed at 08/23/16 0732  Gross per 24 hour  Intake          1883.75 ml  Output             1025 ml  Net           858.75 ml   Filed Weights   08/22/16 0550  Weight: 91.2 kg (201 lb 1 oz)    Examination: General: no evidence of air hunger or discomfort  Lungs: Coarse upper airway crackles transmitted throughout all fields with no wheezing Cardiovascular: Tachycardic but regular without appreciable murmur or gallop Abdomen: Nondistended, soft, bowel sounds absent, no apparent rebound Extremities: 1+ right lower extremity pedal edema without other edema appreciable   CBC:  Recent Labs Lab 08/22/16 0049 08/23/16 0428  WBC 5.8 17.3*  NEUTROABS 4.2  --   HGB 11.1* 9.9*  HCT 34.0* 30.1*  MCV 84.6 84.8  PLT 114* 93*   Basic Metabolic Panel:  Recent Labs Lab 08/22/16  0125 08/23/16 0428  NA 144 147*  K 4.7 5.4*  CL 109 115*  CO2 26 25  GLUCOSE 123* 79  BUN 44* 47*  CREATININE 1.68* 2.57*  CALCIUM 9.6 8.4*   GFR: Estimated Creatinine Clearance: 32.3 mL/min (by C-G formula based on SCr of 2.57 mg/dL (H)).  Liver Function Tests:  Recent Labs Lab 08/22/16 0125 08/23/16 0428  AST 42* 37  ALT 44 39  ALKPHOS 83 67  BILITOT 0.4 0.7  PROT 7.5 6.0*  ALBUMIN 3.3* 2.4*    CBG:  Recent Labs Lab 08/22/16 0119  GLUCAP 97    Recent Results (from the past 240 hour(s))  Urine culture     Status: None   Collection Time: 08/22/16  1:07 AM  Result Value  Ref Range Status   Specimen Description URINE, RANDOM  Final   Special Requests NONE  Final   Culture NO GROWTH  Final   Report Status 08/23/2016 FINAL  Final  Respiratory Panel by PCR     Status: Abnormal   Collection Time: 08/22/16  6:00 AM  Result Value Ref Range Status   Adenovirus NOT DETECTED NOT DETECTED Final   Coronavirus 229E NOT DETECTED NOT DETECTED Final   Coronavirus HKU1 NOT DETECTED NOT DETECTED Final   Coronavirus NL63 NOT DETECTED NOT DETECTED Final   Coronavirus OC43 NOT DETECTED NOT DETECTED Final   Metapneumovirus NOT DETECTED NOT DETECTED Final   Rhinovirus / Enterovirus DETECTED (A) NOT DETECTED Final   Influenza A NOT DETECTED NOT DETECTED Final   Influenza B NOT DETECTED NOT DETECTED Final   Parainfluenza Virus 1 NOT DETECTED NOT DETECTED Final   Parainfluenza Virus 2 NOT DETECTED NOT DETECTED Final   Parainfluenza Virus 3 NOT DETECTED NOT DETECTED Final   Parainfluenza Virus 4 NOT DETECTED NOT DETECTED Final   Respiratory Syncytial Virus NOT DETECTED NOT DETECTED Final   Bordetella pertussis NOT DETECTED NOT DETECTED Final   Chlamydophila pneumoniae NOT DETECTED NOT DETECTED Final   Mycoplasma pneumoniae NOT DETECTED NOT DETECTED Final  MRSA PCR Screening     Status: None   Collection Time: 08/22/16  6:00 AM  Result Value Ref Range Status   MRSA by PCR NEGATIVE NEGATIVE Final    Comment:        The GeneXpert MRSA Assay (FDA approved for NASAL specimens only), is one component of a comprehensive MRSA colonization surveillance program. It is not intended to diagnose MRSA infection nor to guide or monitor treatment for MRSA infections.   Culture, respiratory (NON-Expectorated)     Status: None (Preliminary result)   Collection Time: 08/22/16  3:04 PM  Result Value Ref Range Status   Specimen Description TRACHEAL ASPIRATE  Final   Special Requests NONE  Final   Gram Stain   Final    ABUNDANT WBC PRESENT,BOTH PMN AND MONONUCLEAR MODERATE SQUAMOUS  EPITHELIAL CELLS PRESENT MODERATE GRAM POSITIVE COCCI IN PAIRS FEW GRAM POSITIVE RODS RARE GRAM NEGATIVE RODS RARE GRAM NEGATIVE COCCI IN PAIRS    Culture PENDING  Incomplete   Report Status PENDING  Incomplete     Scheduled Meds: . ceFEPime (MAXIPIME) IV  1 g Intravenous Q12H  . enoxaparin (LOVENOX) injection  90 mg Subcutaneous Q12H  . hydrocortisone sod succinate (SOLU-CORTEF) inj  50 mg Intravenous Q8H  . levalbuterol  0.63 mg Nebulization TID  . mouth rinse  15 mL Mouth Rinse BID  . vancomycin  1,500 mg Intravenous Q24H   Continuous Infusions: . sodium chloride 10 mL/hr at 08/23/16  0300     LOS: 1 day   Lonia BloodJeffrey T. McClung, MD [facility] Office  [phone] Pager - Text Page per Amion as per below:  On-Call/Text Page:      [email or web address]      password [email or web address]  If 7PM-7AM, please contact night-coverage [email or web address] Password [email or web address] 08/23/2016, 11:14 AM

## 2016-08-23 NOTE — Care Management Note (Addendum)
Case Management Note  Patient Details  Name: [name] MRN: [id number] Date of Birth: 08/31/1951  Subjective/Objective:    Patient is from [facility], the plan is for him to return there at dc. Per CSW [facility] is closing , he will need to go to a SNF., CSW following.  He presents with hypothermia, hypotension, severe weakness, lethargic, and hypoxic, has chf , csr with opacity in right base. He conts on HFNC.  NCM will cont to follow for dc needs.                   Action/Plan:   Expected Discharge Date:                  Expected Discharge Plan:  Assisted Living / Rest Home  In-House Referral:  Clinical Social Work  Discharge planning Services  CM Consult  Post Acute Care Choice:    Choice offered to:     DME Arranged:    DME Agency:     HH Arranged:    HH Agency:     Status of Service:  In process, will continue to follow  If discussed at Long Length of Stay Meetings, dates discussed:    Additional Comments:  Leone Havenaylor, Mahki Spikes [place], RN 08/23/2016, 2:24 PM

## 2016-08-23 NOTE — Progress Notes (Signed)
Pharmacy Antibiotic Note  [name] is a 65 y.o. male admitted on 08/22/2016 with pneumonia.   Pharmacy was consulted on 08/22/16 for vancomycin dosing. Antibiotic renal adjustment consult also ordered. WBC increased from wnl to 17.3K Scr has increased to 2.57, CrCl declined to ~ 32 ml/min.  Vancomycin 2 g IV loading dose given last night ~ 18:13  I will decrease the maintenance vancomycin dose and cefepime dose for renal insufficency, Crcl ~ 32 ml/min.  Plan: Decrease IV Vancomycin 1250mg  Q24H. Goal trough 15-20. Decrease Cefepime to 1g Q24h Monitor renal function and cultures Vanc level as needed at steady state  Height: 6' (182.9 cm) Weight: 201 lb 1 oz (91.2 kg) IBW/kg (Calculated) : 77.6  Temp (24hrs), Avg:97.7 F (36.5 C), Min:94.6 F (34.8 C), Max:99.9 F (37.7 C)   Recent Labs Lab 08/22/16 0049 08/22/16 0123 08/22/16 0125 08/22/16 0411 08/22/16 0700 08/23/16 0428  WBC 5.8  --   --   --   --  17.3*  CREATININE  --   --  1.68*  --   --  2.57*  LATICACIDVEN  --  2.11*  --  0.92 1.1  --     Estimated Creatinine Clearance: 32.3 mL/min (by C-G formula based on SCr of 2.57 mg/dL (H)).    No Known Allergies  Antimicrobials this admission: CTX 11/12 >> 11/12 Azithromycin 11/12 >> 11/12 Vanc 11/12 >> Cefepime 11/12 >>  Dose adjustments this admission: 11/13 Decrease IV Vancomycin 1250mg  Q24H and decrease Cefepime to 1g Q24h due to SCr increased to 2.57, CrCl ~ 32 ml/min.  Microbiology results: 11/12 BCx: ngtd 11/12 UCx: negative 11/12 MRSA PCR: negative 11/12 Respiratory Panel: Rhinovirus/Enterovirus 11/12: resp trach aspirate: few GPR, rare GNR,  rare GNC in pairs  Thank you for allowing pharmacy to be a part of this patient's care.  Noah Delaineuth Loreal Schuessler, RPh Clinical Pharmacist Pager: [phone] 08/23/2016 1:54 PM

## 2016-08-23 NOTE — Clinical Social Work Note (Signed)
Received call from Chiropodistassistant director of social work. Patient's ALF is closing around 11/16 and were working on finding other placement for their residents. They were [facility]. CSW has reached out to hospital liason.  Charlynn CourtSarah Nathaneil Feagans, CSW [phone]

## 2016-08-24 DIAGNOSIS — E038 Other specified hypothyroidism: Secondary | ICD-10-CM | POA: Diagnosis present

## 2016-08-24 DIAGNOSIS — A419 Sepsis, unspecified organism: Secondary | ICD-10-CM | POA: Diagnosis present

## 2016-08-24 DIAGNOSIS — N183 Chronic kidney disease, stage 3 unspecified: Secondary | ICD-10-CM | POA: Diagnosis present

## 2016-08-24 DIAGNOSIS — I959 Hypotension, unspecified: Secondary | ICD-10-CM

## 2016-08-24 DIAGNOSIS — I82403 Acute embolism and thrombosis of unspecified deep veins of lower extremity, bilateral: Secondary | ICD-10-CM

## 2016-08-24 DIAGNOSIS — A408 Other streptococcal sepsis: Secondary | ICD-10-CM

## 2016-08-24 DIAGNOSIS — B348 Other viral infections of unspecified site: Secondary | ICD-10-CM | POA: Diagnosis present

## 2016-08-24 DIAGNOSIS — N170 Acute kidney failure with tubular necrosis: Secondary | ICD-10-CM | POA: Diagnosis present

## 2016-08-24 DIAGNOSIS — J189 Pneumonia, unspecified organism: Secondary | ICD-10-CM | POA: Diagnosis present

## 2016-08-24 LAB — CBC
HEMATOCRIT: 28.2 % — AB (ref 39.0–52.0)
HEMOGLOBIN: 9.4 g/dL — AB (ref 13.0–17.0)
MCH: 27.7 pg (ref 26.0–34.0)
MCHC: 33.3 g/dL (ref 30.0–36.0)
MCV: 83.2 fL (ref 78.0–100.0)
Platelets: 65 10*3/uL — ABNORMAL LOW (ref 150–400)
RBC: 3.39 MIL/uL — ABNORMAL LOW (ref 4.22–5.81)
RDW: 19.4 % — ABNORMAL HIGH (ref 11.5–15.5)
WBC: 11.1 10*3/uL — AB (ref 4.0–10.5)

## 2016-08-24 LAB — CULTURE, RESPIRATORY W GRAM STAIN

## 2016-08-24 LAB — LEGIONELLA PNEUMOPHILA SEROGP 1 UR AG: L. pneumophila Serogp 1 Ur Ag: NEGATIVE

## 2016-08-24 LAB — CULTURE, RESPIRATORY: CULTURE: NORMAL

## 2016-08-24 LAB — COMPREHENSIVE METABOLIC PANEL WITH GFR
ALT: 37 U/L (ref 17–63)
AST: 63 U/L — ABNORMAL HIGH (ref 15–41)
Albumin: 2.4 g/dL — ABNORMAL LOW (ref 3.5–5.0)
Alkaline Phosphatase: 63 U/L (ref 38–126)
Anion gap: 7 (ref 5–15)
BUN: 57 mg/dL — ABNORMAL HIGH (ref 6–20)
CO2: 24 mmol/L (ref 22–32)
Calcium: 8.7 mg/dL — ABNORMAL LOW (ref 8.9–10.3)
Chloride: 120 mmol/L — ABNORMAL HIGH (ref 101–111)
Creatinine, Ser: 3.26 mg/dL — ABNORMAL HIGH (ref 0.61–1.24)
GFR calc Af Amer: 22 mL/min — ABNORMAL LOW
GFR calc non Af Amer: 19 mL/min — ABNORMAL LOW
Glucose, Bld: 75 mg/dL (ref 65–99)
Potassium: 4.9 mmol/L (ref 3.5–5.1)
Sodium: 151 mmol/L — ABNORMAL HIGH (ref 135–145)
Total Bilirubin: 0.8 mg/dL (ref 0.3–1.2)
Total Protein: 6.1 g/dL — ABNORMAL LOW (ref 6.5–8.1)

## 2016-08-24 MED ORDER — DEXTROSE 5 % IV SOLN
INTRAVENOUS | Status: DC
  Administered 2016-08-24: 500 mL via INTRAVENOUS
  Administered 2016-08-25 – 2016-08-28 (×4): via INTRAVENOUS
  Administered 2016-08-28 – 2016-08-29 (×3): 1 mL via INTRAVENOUS
  Administered 2016-08-30 – 2016-08-31 (×3): via INTRAVENOUS

## 2016-08-24 MED ORDER — ENOXAPARIN SODIUM 100 MG/ML ~~LOC~~ SOLN
90.0000 mg | SUBCUTANEOUS | Status: DC
  Administered 2016-08-25 – 2016-08-26 (×2): 90 mg via SUBCUTANEOUS
  Filled 2016-08-24 (×2): qty 1

## 2016-08-24 NOTE — Progress Notes (Signed)
Pharmacy Antibiotic Note  [name] is a 65 y.o. male admitted on 08/22/2016 with pneumonia.   Pharmacy was consulted on 08/22/16 for vancomycin dosing. Antibiotic renal adjustment consult also ordered.  SCr is increasing/doubled 1.68>2.57>today 3.26.  Estimated CrCl declined to ~ 25 ml/min.  WBC increased 5.8> 17.3>11.1k She receivedVancomycin 2 g IV loading dose on 11/12 PM and 1250 mg IV last night. ID pharmacy resident discussed ID with Dr. [name] ArtWoods and recommended to consider discontinuing IV vancomycin as cultures have not grown staph, respiratory culture likely normal flora.  Dr. [name] ArtWoods wanted to see and evaluate patient prior to stopping vancomycin.  This afternoon the respiratory culture results as   Normal flora /Final    Plan: Consider DC Vancomycin , otherwise I will hold vancomycin today and check random vancomycin level tomorrow AM.  Continue  Cefepime to 1g Q24h (dose decreased yesterday)  Monitor renal function and cultures   Height: 6' (182.9 cm) Weight: 201 lb 1 oz (91.2 kg) IBW/kg (Calculated) : 77.6  Temp (24hrs), Avg:98.2 F (36.8 C), Min:97.9 F (36.6 C), Max:98.8 F (37.1 C)   Recent Labs Lab 08/22/16 0049 08/22/16 0123 08/22/16 0125 08/22/16 0411 08/22/16 0700 08/23/16 0428 08/24/16 0405  WBC 5.8  --   --   --   --  17.3* 11.1*  CREATININE  --   --  1.68*  --   --  2.57* 3.26*  LATICACIDVEN  --  2.11*  --  0.92 1.1  --   --     Estimated Creatinine Clearance: 25.5 mL/min (by C-G formula based on SCr of 3.26 mg/dL (H)).    No Known Allergies  Antimicrobials this admission: CTX 11/12 >> 11/12 Azithromycin 11/12 >> 11/12 Vanc 11/12 >> Cefepime 11/12 >>  Dose adjustments this admission: 11/13 Decrease IV Vancomycin 1250mg  Q24H and decrease Cefepime to 1g Q24h due to SCr increased to 2.57, CrCl ~ 32 ml/min.  Microbiology results: 11/12 BCx: ngtd 11/12 UCx: negative 11/12 MRSA PCR: negative 11/12 Respiratory Panel:  Rhinovirus/Enterovirus 11/12: resp trach aspirate: few GPR, rare GNR,  rare GNC in pairs:  Normal flora Final   Thank you for allowing pharmacy to be a part of this patient's care.  [name], RPh Clinical Pharmacist Pager: [phone] 08/24/2016 4:32 PM

## 2016-08-24 NOTE — Progress Notes (Signed)
ANTICOAGULATION CONSULT NOTE - Follow up Pharmacy Consult for lovenox Indication: VTE treatment  No Known Allergies  Patient Measurements: Height: 6' (182.9 cm) Weight: 201 lb 1 oz (91.2 kg) IBW/kg (Calculated) : 77.6  Vital Signs: Temp: 98.8 F (37.1 C) (11/14 1500) Temp Source: Oral (11/14 1500) BP: 109/69 (11/14 1500) Pulse Rate: 62 (11/14 1500)  Labs:  Recent Labs  08/22/16 0049 08/22/16 0125 08/23/16 0428 08/24/16 0405  HGB 11.1*  --  9.9* 9.4*  HCT 34.0*  --  30.1* 28.2*  PLT 114*  --  93* 65*  CREATININE  --  1.68* 2.57* 3.26*    Estimated Creatinine Clearance: 25.5 mL/min (by C-G formula based on SCr of 3.26 mg/dL (H)).   Medical History: Past Medical History:  Diagnosis Date  . Dementia 06/19/2014  . DVT (deep venous thrombosis) (HCC)    right leg  . Dyslipidemia   . Hypertension   . Mental retardation   . Obesity   . Other and unspecified hyperlipidemia 06/19/2014    Assessment: PTA Xarelto for h/o of DVT held. Lovenox 90mg  (1mg /kg) BID VTE PPX started inpatient Hgb 11.1>9.9>9.1 Plts decreasing 114>93>65k   No bleeding noted. Back in March 2017 pltc was 148>138k SCr is increasing/ has doubled 1.68>2.57>today 3.26 -SCr on admit 1.68 (this is probably around his baseline as of recently), CrCl ~25 ml/min.  Weight =91 kg  Goal of Therapy:  Monitor platelets by anticoagulation protocol: Yes   Plan:  Decrease Lovenox to  90mg  q24h   Monitor renal function and CBC (pltc decreased)   [name], RPh Clinical Pharmacist Pager: [phone] 08/24/2016 4:47 PM

## 2016-08-24 NOTE — Progress Notes (Signed)
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  EMS found him lethargic, congested, and hypoxic at 88% on room air.  In the ED he was found to be hypothermic and CXR showed atelectasis vs opacity in the right base  He has been admitted twice in the past year under similar circumstances. The first time in January he had mild AKI, and improved with fluids. The second time in March he was hypothermic but without any focal signs of infection and his TSH, cortisol, and lactate were normal as was a flu swab.  He was given fluids and he returned completely to normal with just supportive care.   Subjective: 11/14  patient's eyes open fracture around the room. Nonverbal     Assessment & Plan:   Principal Problem:   Sepsis (HCC) Active Problems:   DVT (deep venous thrombosis) (HCC)   CKD (chronic kidney disease) stage 3, GFR 30-59 ml/min   Mental retardation   Absolute anemia   Septic shock due to positive strep pneumo Pneumonia +/- Rhinovirus pneumonitis  -Clinically remains in a precarious situation with persisting hypotension and intermittent severe hypoxemia  - continue conservative medical care as per Dr. Sharon Seller discussion with his power of attorney  - not a candidate for BiPAP given his heavy secretions and his inability to control them on his own  - admin med tx to assure patient not anxious or suffering with uncontrolled pain or air hunger -Physiotherapy vest QID  Acute kidney failure on CKD stage 3(Baseline Cr ~2)  - creatinine climbing in setting of severe sepsis/septic shock Lab Results  Component Value Date   CREATININE 3.26 (H) 08/24/2016   CREATININE 2.57 (H) 08/23/2016   CREATININE 1.68 (H) 08/22/2016   Chronic DVT -On  Xarelto as outpt, presently on full dose Lovenox  HTN/Hypotensive -Continues to be hypotensive however in accordance with above will not increase aggressive measures i.e. pressors.   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  Subacute hypothyroidism -TSH 5.3, and free T4 0.81  Hypernatremia -D5W  50 ml/hr      DVT prophylaxis: Full dose Lovenox Code Status: DO NOT RESUSCITATE Family Communication: None Disposition Plan: Guarded   Consultants:  None   Procedures/Significant Events:  None   Cultures 11/12 urine negative  11/12 blood left arm/right hand NGTD 11/12 MRSA by PCR negative 11/12 positive respiratory floor 11/12 respiratory virus panel positive Rhinovirus/Enterovirus 11/12 positive strep pneumo urine antigen    Antimicrobials: Azithromycin 11/11 > 11/12 Ceftriaxone 11/11 > 11/12 Maxipime 11/12 > Vanc 11/12 > 11/14   Devices None    LINES / TUBES:  None    Continuous Infusions: . sodium chloride 50 mL/hr at 08/24/16 1400     Objective: Vitals:   08/24/16 1300 08/24/16 1400 08/24/16 1435 08/24/16 1500  BP: 112/65 99/69  109/69  Pulse: (!) 59 (!) 55 61 62  Resp: 15 16 (!) 24 15  Temp:    98.8 F (37.1 C)  TempSrc:    Oral  SpO2: 95% 95% 94% 96%  Weight:      Height:        Intake/Output Summary (Last 24 hours) at 08/24/16 1702 Last data filed at 08/24/16 1521  Gross per 24 hour  Intake  1570.83 ml  Output             1575 ml  Net            -4.17 ml   Filed Weights   08/22/16 0550  Weight: 91.2 kg (201 lb 1 oz)    Examination:  General:patient's eyes open fracture around the room. Nonverbal, positive acute respiratory distress Eyes: negative scleral hemorrhage, negative anisocoria, negative icterus ENT: Negative Runny nose, negative gingival bleeding, Neck:  Negative scars, masses, torticollis, lymphadenopathy, JVD Lungs: diffuse rhonchi, decreased breath sounds bibasilar Rt>> Lt,  without wheezes or crackles. Purulent sputum in patient suction tubing Cardiovascular: Tachycardic, Regular rhythm without murmur gallop or rub normal S1 and S2 Abdomen: negative abdominal pain, nondistended, positive soft, bowel sounds, no rebound, no ascites, no appreciable mass Extremities: No significant cyanosis, clubbing, or edema bilateral lower extremities Skin: Negative rashes, lesions, ulcers Psychiatric:  Unable to evaluate Central nervous system:  Unable to evaluate  .     Data Reviewed: Care during the described time interval was provided by me .  I have reviewed this patient's available data, including medical history, events of note, physical examination, and all test results as part of my evaluation. I have personally reviewed and interpreted all radiology studies.  CBC:  Recent Labs Lab 08/22/16 0049 08/23/16 0428 08/24/16 0405  WBC 5.8 17.3* 11.1*  NEUTROABS 4.2  --   --   HGB 11.1* 9.9* 9.4*  HCT 34.0* 30.1* 28.2*  MCV 84.6 84.8 83.2  PLT 114* 93* 65*   Basic Metabolic Panel:  Recent Labs Lab 08/22/16 0125 08/23/16 0428 08/24/16 0405  NA 144 147* 151*  K 4.7 5.4* 4.9  CL 109 115* 120*  CO2 26 25 24   GLUCOSE 123* 79 75  BUN 44* 47* 57*  CREATININE 1.68* 2.57* 3.26*  CALCIUM 9.6 8.4* 8.7*   GFR: Estimated Creatinine Clearance: 25.5 mL/min (by C-G formula based on SCr of 3.26 mg/dL (H)). Liver Function Tests:  Recent Labs Lab 08/22/16 0125 08/23/16 0428 08/24/16 0405  AST 42* 37 63*  ALT 44 39 37  ALKPHOS 83 67 63  BILITOT 0.4 0.7 0.8  PROT 7.5 6.0* 6.1*  ALBUMIN 3.3* 2.4* 2.4*   No results for input(s): LIPASE, AMYLASE in the last 168 hours. No results for input(s): AMMONIA in the last 168 hours. Coagulation Profile: No results for input(s): INR, PROTIME in the last 168 hours. Cardiac Enzymes: No results for input(s): CKTOTAL, CKMB, CKMBINDEX, TROPONINI in the last 168 hours. BNP (last 3 results) No results for input(s): PROBNP in  the last 8760 hours. HbA1C: No results for input(s): HGBA1C in the last 72 hours. CBG:  Recent Labs Lab 08/22/16 0119  GLUCAP 97   Lipid Profile: No results for input(s): CHOL, HDL, LDLCALC, TRIG, CHOLHDL, LDLDIRECT in the last 72 hours. Thyroid Function Tests:  Recent Labs  08/22/16 0220 08/23/16 0428  TSH 5.377*  --   FREET4  --  0.81   Anemia Panel: No results for input(s): VITAMINB12, FOLATE, FERRITIN, TIBC, IRON, RETICCTPCT in the last 72 hours. Urine analysis:    Component Value Date/Time   COLORURINE YELLOW 08/22/2016 0049   APPEARANCEUR CLEAR 08/22/2016 0049   LABSPEC 1.016 08/22/2016 0049   PHURINE 5.0 08/22/2016 0049   GLUCOSEU NEGATIVE 08/22/2016 0049   HGBUR NEGATIVE 08/22/2016 0049   BILIRUBINUR NEGATIVE 08/22/2016 0049   KETONESUR NEGATIVE 08/22/2016 0049   PROTEINUR NEGATIVE 08/22/2016 0049   UROBILINOGEN 0.2 06/18/2014 1147   NITRITE NEGATIVE  08/22/2016 0049   LEUKOCYTESUR NEGATIVE 08/22/2016 0049   Sepsis Labs: @LABRCNTIP (procalcitonin:4,lacticidven:4)  ) Recent Results (from the past 240 hour(s))  Urine culture     Status: None   Collection Time: 08/22/16  1:07 AM  Result Value Ref Range Status   Specimen Description URINE, RANDOM  Final   Special Requests NONE  Final   Culture NO GROWTH  Final   Report Status 08/23/2016 FINAL  Final  Blood Culture (routine x 2)     Status: None (Preliminary result)   Collection Time: 08/22/16  1:15 AM  Result Value Ref Range Status   Specimen Description BLOOD LEFT ARM  Final   Special Requests BOTTLES DRAWN AEROBIC AND ANAEROBIC 5CC EA  Final   Culture NO GROWTH 2 DAYS  Final   Report Status PENDING  Incomplete  Blood Culture (routine x 2)     Status: None (Preliminary result)   Collection Time: 08/22/16  1:16 AM  Result Value Ref Range Status   Specimen Description BLOOD RIGHT HAND  Final   Special Requests BOTTLES DRAWN AEROBIC AND ANAEROBIC 5CC EA  Final   Culture NO GROWTH 2 DAYS  Final   Report  Status PENDING  Incomplete  Respiratory Panel by PCR     Status: Abnormal   Collection Time: 08/22/16  6:00 AM  Result Value Ref Range Status   Adenovirus NOT DETECTED NOT DETECTED Final   Coronavirus 229E NOT DETECTED NOT DETECTED Final   Coronavirus HKU1 NOT DETECTED NOT DETECTED Final   Coronavirus NL63 NOT DETECTED NOT DETECTED Final   Coronavirus OC43 NOT DETECTED NOT DETECTED Final   Metapneumovirus NOT DETECTED NOT DETECTED Final   Rhinovirus / Enterovirus DETECTED (A) NOT DETECTED Final   Influenza A NOT DETECTED NOT DETECTED Final   Influenza B NOT DETECTED NOT DETECTED Final   Parainfluenza Virus 1 NOT DETECTED NOT DETECTED Final   Parainfluenza Virus 2 NOT DETECTED NOT DETECTED Final   Parainfluenza Virus 3 NOT DETECTED NOT DETECTED Final   Parainfluenza Virus 4 NOT DETECTED NOT DETECTED Final   Respiratory Syncytial Virus NOT DETECTED NOT DETECTED Final   Bordetella pertussis NOT DETECTED NOT DETECTED Final   Chlamydophila pneumoniae NOT DETECTED NOT DETECTED Final   Mycoplasma pneumoniae NOT DETECTED NOT DETECTED Final  MRSA PCR Screening     Status: None   Collection Time: 08/22/16  6:00 AM  Result Value Ref Range Status   MRSA by PCR NEGATIVE NEGATIVE Final    Comment:        The GeneXpert MRSA Assay (FDA approved for NASAL specimens only), is one component of a comprehensive MRSA colonization surveillance program. It is not intended to diagnose MRSA infection nor to guide or monitor treatment for MRSA infections.   Culture, respiratory (NON-Expectorated)     Status: None   Collection Time: 08/22/16  3:04 PM  Result Value Ref Range Status   Specimen Description TRACHEAL ASPIRATE  Final   Special Requests NONE  Final   Gram Stain   Final    ABUNDANT WBC PRESENT,BOTH PMN AND MONONUCLEAR MODERATE SQUAMOUS EPITHELIAL CELLS PRESENT MODERATE GRAM POSITIVE COCCI IN PAIRS FEW GRAM POSITIVE RODS RARE GRAM NEGATIVE RODS RARE GRAM NEGATIVE COCCI IN PAIRS     Culture Consistent with normal respiratory flora.  Final   Report Status 08/24/2016 FINAL  Final         Radiology Studies: Dg Chest Port 1 View  Result Date: 08/23/2016 CLINICAL DATA:  Pneumonia EXAM: PORTABLE CHEST 1 VIEW COMPARISON:  08/22/2016 FINDINGS: Extensive bilateral predominately central and basilar airspace disease is worse. Right pleural effusion is suspected as the right hemidiaphragm is obscured. Heart is prominent. No pneumothorax. IMPRESSION: Worsening bilateral airspace disease. Right pleural effusion is suspected. Electronically Signed   By: Jolaine ClickArthur  Hoss M.D.   On: 08/23/2016 07:23        Scheduled Meds: . ceFEPime (MAXIPIME) IV  1 g Intravenous Q24H  . [START ON 08/25/2016] enoxaparin (LOVENOX) injection  90 mg Subcutaneous Q24H  . hydrocortisone sod succinate (SOLU-CORTEF) inj  50 mg Intravenous Q8H  . levalbuterol  0.63 mg Nebulization TID  . mouth rinse  15 mL Mouth Rinse BID  . vancomycin  1,250 mg Intravenous Q24H   Continuous Infusions: . sodium chloride 50 mL/hr at 08/24/16 1400     LOS: 2 days    Time spent: 40 minutes    Cattleya Dobratz, Roselind MessierURTIS J, MD [facility] Pager [phone]   If 7PM-7AM, please contact night-coverage [email or web address] Password [email or web address] 08/24/2016, 5:02 PM

## 2016-08-25 DIAGNOSIS — N170 Acute kidney failure with tubular necrosis: Secondary | ICD-10-CM

## 2016-08-25 LAB — GLUCOSE, CAPILLARY
GLUCOSE-CAPILLARY: 99 mg/dL (ref 65–99)
GLUCOSE-CAPILLARY: 99 mg/dL (ref 65–99)

## 2016-08-25 LAB — BASIC METABOLIC PANEL
Anion gap: 9 (ref 5–15)
BUN: 56 mg/dL — ABNORMAL HIGH (ref 6–20)
CHLORIDE: 116 mmol/L — AB (ref 101–111)
CO2: 26 mmol/L (ref 22–32)
CREATININE: 3.03 mg/dL — AB (ref 0.61–1.24)
Calcium: 9.1 mg/dL (ref 8.9–10.3)
GFR calc non Af Amer: 20 mL/min — ABNORMAL LOW (ref >60)
GFR, EST AFRICAN AMERICAN: 24 mL/min — AB (ref >60)
Glucose, Bld: 99 mg/dL (ref 65–99)
POTASSIUM: 4.7 mmol/L (ref 3.5–5.1)
SODIUM: 151 mmol/L — AB (ref 135–145)

## 2016-08-25 LAB — MAGNESIUM: MAGNESIUM: 2.5 mg/dL — AB (ref 1.7–2.4)

## 2016-08-25 MED ORDER — HYDROCORTISONE NA SUCCINATE PF 100 MG IJ SOLR
50.0000 mg | Freq: Two times a day (BID) | INTRAMUSCULAR | Status: DC
  Administered 2016-08-25 – 2016-08-27 (×4): 50 mg via INTRAVENOUS
  Filled 2016-08-25 (×4): qty 2

## 2016-08-25 NOTE — Clinical Social Work Note (Signed)
CSW called and left voicemail for patient's HCPOA. Will discuss discharge plan when she returns call.  Charlynn CourtSarah Audery Wassenaar, CSW [phone]

## 2016-08-25 NOTE — Progress Notes (Signed)
[facility] TEAM 1 - Stepdown/ICU TEAM  [name]  ZOX:[id number] DOB: 07-26-1951 DOA: 08/22/2016 PCP: Colon Branch, MD    Brief Narrative:  65 y.o. male with a history of severe mental handicap (nonverbal at baseline), HTN, DVT on Xarelto, CKD and mild diastolic CHF who presented with cough and severe weakness.  EMS found him lethargic, congested, and hypoxic at 88% on room air.  In the ED he was found to be hypothermic and CXR showed atelectasis vs opacity in the right base  He has been admitted twice in the past year under similar circumstances.  The first time in January he had mild AKI, and improved with fluids.  The second time in March he was hypothermic but without any focal signs of infection and his TSH, cortisol, and lactate were normal as was a flu swab.  He was given fluids and he returned completely to normal with just supportive care.  Subjective: The patient will open his eyes and look at the examiner but does not follow me around the room.  He will not follow simple commands.  His power of attorney is at the bedside and reports that while he is more awake he is not yet back to his mental status baseline.  Assessment & Plan:  Septic shock due to Pneumonia +/- Rhinovirus pneumonitis  not a candidate for BiPAP given his heavy secretions and his inability to control them on his own - remarkably appears to be stabilizing at this time - continue current supportive care  Acute kidney failure on CKD stage 3  Baseline crt ~2 - creatinine appears to have peaked at approximately 3.3 keep hydrated and follow -   Chronic DVT On Xarelto as outpt - presently on full dose Lovenox  HTN Not an active problem at this time  Anemia  hemoglobin relatively stable over the last 24 hours  - no evidence of acute blood loss  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 rate 16, height 6' (1.829 m), weight 91.2 kg (201 lb 1 oz), SpO2 97 %.  Intake/Output Summary (Last 24 hours) at 08/25/16 1431 Last data filed at 08/25/16 1300  Gross per 24 hour  Intake              875 ml  Output             1675 ml  Net             -800 ml   Filed Weights   08/22/16 0550  Weight: 91.2 kg (201 lb 1 oz)    Examination: General: Appears to be resting comfortably without respiratory distress  Lungs: mild bibasilar crackles with no wheezing  Cardiovascular: regular rate and rhythm without murmur  Abdomen: Nondistended, soft, bowel sounds absent, no apparent rebound Extremities: no significant cyanosis bilateral lower extremities - 1+ bilateral lower extremity edema    CBC:  Recent Labs Lab 08/22/16 0049 08/23/16 0428 08/24/16 0405  WBC 5.8 17.3* 11.1*  NEUTROABS 4.2  --   --  HGB 11.1* 9.9* 9.4*  HCT 34.0* 30.1* 28.2*  MCV 84.6 84.8 83.2  PLT 114* 93* 65*   Basic Metabolic Panel:  Recent Labs Lab 08/22/16 0125 08/23/16 0428 08/24/16 0405 08/25/16 0532  NA 144 147* 151* 151*  K 4.7 5.4* 4.9 4.7  CL 109 115* 120* 116*  CO2 26 25 24 26   GLUCOSE 123* 79 75 99  BUN 44* 47* 57* 56*  CREATININE 1.68* 2.57* 3.26* 3.03*  CALCIUM 9.6 8.4* 8.7* 9.1  MG  --   --   --  2.5*   GFR: Estimated Creatinine Clearance: 27.4 mL/min (by C-G formula based on SCr of 3.03 mg/dL (H)).  Liver Function Tests:  Recent Labs Lab 08/22/16 0125 08/23/16 0428 08/24/16 0405  AST 42* 37 63*  ALT 44 39 37  ALKPHOS 83 67 63  BILITOT 0.4 0.7 0.8  PROT 7.5 6.0*  6.1*  ALBUMIN 3.3* 2.4* 2.4*    CBG:  Recent Labs Lab 08/22/16 0119 08/25/16 1305  GLUCAP 97 99    Recent Results (from the past 240 hour(s))  Urine culture     Status: None   Collection Time: 08/22/16  1:07 AM  Result Value Ref Range Status   Specimen Description URINE, RANDOM  Final   Special Requests NONE  Final   Culture NO GROWTH  Final   Report Status 08/23/2016 FINAL  Final  Blood Culture (routine x 2)     Status: None (Preliminary result)   Collection Time: 08/22/16  1:15 AM  Result Value Ref Range Status   Specimen Description BLOOD LEFT ARM  Final   Special Requests BOTTLES DRAWN AEROBIC AND ANAEROBIC 5CC EA  Final   Culture NO GROWTH 2 DAYS  Final   Report Status PENDING  Incomplete  Blood Culture (routine x 2)     Status: None (Preliminary result)   Collection Time: 08/22/16  1:16 AM  Result Value Ref Range Status   Specimen Description BLOOD RIGHT HAND  Final   Special Requests BOTTLES DRAWN AEROBIC AND ANAEROBIC 5CC EA  Final   Culture NO GROWTH 2 DAYS  Final   Report Status PENDING  Incomplete  Respiratory Panel by PCR     Status: Abnormal   Collection Time: 08/22/16  6:00 AM  Result Value Ref Range Status   Adenovirus NOT DETECTED NOT DETECTED Final   Coronavirus 229E NOT DETECTED NOT DETECTED Final   Coronavirus HKU1 NOT DETECTED NOT DETECTED Final   Coronavirus NL63 NOT DETECTED NOT DETECTED Final   Coronavirus OC43 NOT DETECTED NOT DETECTED Final   Metapneumovirus NOT DETECTED NOT DETECTED Final   Rhinovirus / Enterovirus DETECTED (A) NOT DETECTED Final   Influenza A NOT DETECTED NOT DETECTED Final   Influenza B NOT DETECTED NOT DETECTED Final   Parainfluenza Virus 1 NOT DETECTED NOT DETECTED Final   Parainfluenza Virus 2 NOT DETECTED NOT DETECTED Final   Parainfluenza Virus 3 NOT DETECTED NOT DETECTED Final   Parainfluenza Virus 4 NOT DETECTED NOT DETECTED Final   Respiratory Syncytial Virus NOT DETECTED NOT DETECTED Final   Bordetella pertussis  NOT DETECTED NOT DETECTED Final   Chlamydophila pneumoniae NOT DETECTED NOT DETECTED Final   Mycoplasma pneumoniae NOT DETECTED NOT DETECTED Final  MRSA PCR Screening     Status: None   Collection Time: 08/22/16  6:00 AM  Result Value Ref Range Status   MRSA by PCR NEGATIVE NEGATIVE Final    Comment:        The GeneXpert MRSA Assay (  FDA approved for NASAL specimens only), is one component of a comprehensive MRSA colonization surveillance program. It is not intended to diagnose MRSA infection nor to guide or monitor treatment for MRSA infections.   Culture, respiratory (NON-Expectorated)     Status: None   Collection Time: 08/22/16  3:04 PM  Result Value Ref Range Status   Specimen Description TRACHEAL ASPIRATE  Final   Special Requests NONE  Final   Gram Stain   Final    ABUNDANT WBC PRESENT,BOTH PMN AND MONONUCLEAR MODERATE SQUAMOUS EPITHELIAL CELLS PRESENT MODERATE GRAM POSITIVE COCCI IN PAIRS FEW GRAM POSITIVE RODS RARE GRAM NEGATIVE RODS RARE GRAM NEGATIVE COCCI IN PAIRS    Culture Consistent with normal respiratory flora.  Final   Report Status 08/24/2016 FINAL  Final     Scheduled Meds: . ceFEPime (MAXIPIME) IV  1 g Intravenous Q24H  . enoxaparin (LOVENOX) injection  90 mg Subcutaneous Q24H  . hydrocortisone sod succinate (SOLU-CORTEF) inj  50 mg Intravenous Q8H  . levalbuterol  0.63 mg Nebulization TID  . mouth rinse  15 mL Mouth Rinse BID   Continuous Infusions: . dextrose 500 mL (08/24/16 1730)     LOS: 3 days   Lonia BloodJeffrey T. Elisabetta Mishra, MD [facility] Office  [phone] Pager - Text Page per Loretha StaplerAmion as per below:  On-Call/Text Page:      [email or web address]      password [email or web address]  If 7PM-7AM, please contact night-coverage [email or web address] Password [email or web address] 08/25/2016, 2:31 PM

## 2016-08-25 NOTE — Progress Notes (Signed)
Initial Nutrition Assessment  DOCUMENTATION CODES:   Not applicable  INTERVENTION:    Advance PO diet per MD as mental status improves.  NUTRITION DIAGNOSIS:   Inadequate oral intake related to lethargy/confusion as evidenced by NPO status.  GOAL:   Patient will meet greater than or equal to 90% of their needs  MONITOR:   Diet advancement, Skin, I & O's  REASON FOR ASSESSMENT:   Malnutrition Screening Tool, Low Braden    ASSESSMENT:   65 y.o. male with a past medical histo26ry significant for severe MR, HTN, DVT on Xarelto, CKD and HFpEF who presents with worsening cough for one week and weakness.  Patient is non-verbal at baseline. Spoke with patient's guardian, who reports that patient was eating well PTA until he became sick.  Per discussion with RN, patient is not alert enough to take PO's. Conservative care is planned. Nutrition-Focused physical exam completed. Findings are no fat depletion, no muscle depletion, and mild edema.  Labs reviewed: sodium and magnesium elevated. Medications reviewed and include Solu-cortef.   Diet Order:  Diet clear liquid Room service appropriate? Yes; Fluid consistency: Thin  Skin:  Wound (see comment) (stage II pressure injury to sacrum)  Last BM:  11/13  Height:   Ht Readings from Last 1 Encounters:  08/22/16 6' (1.829 m)    Weight:   Wt Readings from Last 1 Encounters:  08/22/16 201 lb 1 oz (91.2 kg)    Ideal Body Weight:  80.9 kg  BMI:  Body mass index is 27.27 kg/m.  Estimated Nutritional Needs:   Kcal:  2100-2300  Protein:  120-135 gm  Fluid:  2.2 L  EDUCATION NEEDS:   No education needs identified at this time  [name], RD, LDN, CNSC Pager [phone] After Hours Pager [phone]

## 2016-08-25 NOTE — Progress Notes (Signed)
Occupational therapy Note:  OT eval completed with full note to follow.  Agree with SNF level rehab at discharge. [name], OTR/L [phone]

## 2016-08-25 NOTE — Clinical Social Work Note (Signed)
Clinical Social Work Assessment  Patient Details  Name: [name] MRN: [id number] Date of Birth: 09/02/1951  Date of referral:  08/25/16               Reason for consult:  Facility Placement, Discharge Planning                Permission sought to share information with:  Facility Medical sales representativeContact Representative, Family Supports Permission granted to share information::  Yes, Verbal Permission Granted  Name::     [name]  Agency::  [facility]  Relationship::  Cousin/Guardian  Contact Information:  [phone]  Housing/Transportation Living arrangements for the past 2 months:  Assisted Living Facility Source of Information:  Medical Team, Guardian Patient Interpreter Needed:  None Criminal Activity/Legal Involvement Pertinent to Current Situation/Hospitalization:  No - Comment as needed Significant Relationships:  Other Family Members Lives with:  Facility Resident Do you feel safe going back to the place where you live?  Yes Need for family participation in patient care:  Yes (Comment)  Care giving concerns:  [facility] is closing tomorrow and [facility].   Social Worker assessment / plan:  Patient only oriented to self. CSW called patient's guardian, [name]. CSW introduced role and explained that discharge planning would be discussed. Patient's guardian was unaware that patient had been hospitalized until yesterday. She is aware that the ALF is closing and they were working on placement on [facility]. Patient's guardian is agreeable. CSW notified her that PT should be working with him today. No further concerns. CSW encouraged patient's guardian to contact CSW as needed. CSW will continue to follow patient and his guardian for support and facilitate discharge to SNF once medically stable.  Employment status:  Retired Health and safety inspectornsurance information:  Medicare PT Recommendations:  Not assessed at this  time Information / Referral to community resources:  Skilled Nursing Facility  Patient/Family's Response to care:  Patient not fully oriented. Patient's guardian agreeable to [facility]. Patient's guardian supportive and involved in patient's care. Patient's guardian appreciated social work intervention.  Patient/Family's Understanding of and Emotional Response to Diagnosis, Current Treatment, and Prognosis:  Patient not fully oriented. Patient's guardian understands that patient will be placed at SNF once stable for discharge. Patient's guardian appears happy with hospital care.  Emotional Assessment Appearance:  Appears stated age Attitude/Demeanor/Rapport:  Unable to Assess Affect (typically observed):  Unable to Assess Orientation:  Oriented to Self Alcohol / Substance use:  Never Used Psych involvement (Current and /or in the community):  No (Comment)  Discharge Needs  Concerns to be addressed:  Care Coordination Readmission within the last 30 days:  No Current discharge risk:  Cognitively Impaired Barriers to Discharge:  No Barriers Identified   Margarito LinerSarah C Trixy Loyola, LCSW 08/25/2016, 10:40 AM

## 2016-08-26 ENCOUNTER — Inpatient Hospital Stay (HOSPITAL_COMMUNITY): Payer: Medicare Other

## 2016-08-26 DIAGNOSIS — J189 Pneumonia, unspecified organism: Secondary | ICD-10-CM | POA: Diagnosis present

## 2016-08-26 LAB — CBC
HEMATOCRIT: 28.3 % — AB (ref 39.0–52.0)
HEMOGLOBIN: 9.2 g/dL — AB (ref 13.0–17.0)
MCH: 27.6 pg (ref 26.0–34.0)
MCHC: 32.5 g/dL (ref 30.0–36.0)
MCV: 85 fL (ref 78.0–100.0)
Platelets: 106 10*3/uL — ABNORMAL LOW (ref 150–400)
RBC: 3.33 MIL/uL — ABNORMAL LOW (ref 4.22–5.81)
RDW: 19.6 % — AB (ref 11.5–15.5)
WBC: 11.3 10*3/uL — AB (ref 4.0–10.5)

## 2016-08-26 LAB — COMPREHENSIVE METABOLIC PANEL
ALBUMIN: 2.5 g/dL — AB (ref 3.5–5.0)
ALK PHOS: 75 U/L (ref 38–126)
ALT: 32 U/L (ref 17–63)
ANION GAP: 5 (ref 5–15)
AST: 62 U/L — ABNORMAL HIGH (ref 15–41)
BILIRUBIN TOTAL: 0.6 mg/dL (ref 0.3–1.2)
BUN: 56 mg/dL — ABNORMAL HIGH (ref 6–20)
CALCIUM: 8.9 mg/dL (ref 8.9–10.3)
CO2: 26 mmol/L (ref 22–32)
Chloride: 119 mmol/L — ABNORMAL HIGH (ref 101–111)
Creatinine, Ser: 2.74 mg/dL — ABNORMAL HIGH (ref 0.61–1.24)
GFR calc Af Amer: 27 mL/min — ABNORMAL LOW (ref >60)
GFR, EST NON AFRICAN AMERICAN: 23 mL/min — AB (ref >60)
GLUCOSE: 108 mg/dL — AB (ref 65–99)
POTASSIUM: 4.7 mmol/L (ref 3.5–5.1)
Sodium: 150 mmol/L — ABNORMAL HIGH (ref 135–145)
TOTAL PROTEIN: 6.3 g/dL — AB (ref 6.5–8.1)

## 2016-08-26 LAB — GLUCOSE, CAPILLARY: GLUCOSE-CAPILLARY: 80 mg/dL (ref 65–99)

## 2016-08-26 MED ORDER — HEPARIN (PORCINE) IN NACL 100-0.45 UNIT/ML-% IJ SOLN
1300.0000 [IU]/h | INTRAMUSCULAR | Status: DC
  Administered 2016-08-27: 1200 [IU]/h via INTRAVENOUS
  Administered 2016-08-28 – 2016-08-29 (×3): 1300 [IU]/h via INTRAVENOUS
  Filled 2016-08-26 (×6): qty 250

## 2016-08-26 NOTE — Consult Note (Signed)
WOC Nurse wound consult note Reason for Consult: new Stage 2 Pressure injury Wound type: Stage 2 Pressure Injury Pressure Ulcer POA: No Measurement: 10cm x 11cm x 0.1cm  Wound bed: 100% clean, open area is pink.  Appears this was a fluid filled blister now drained, but has roof over the distal edge and the left buttock  Drainage (amount, consistency, odor) minimal, serosanguinous  Periwound: intact  Dressing procedure/placement/frequency: Continue soft silicone foam for insulation, protection, and management of drainage.  Will need to place patient on low air loss mattress for pressure redistribution.    WOC Nurse team will follow along with you for weekly wound assessments.  Please notify me of any acute changes in the wounds or any new areas of concerns Maevyn Riordan [facility] MSN, RN,CWOCN, CNS [phone]

## 2016-08-26 NOTE — Care Management Important Message (Signed)
Important Message  Patient Details  Name: [name] MRN: [id number] Date of Birth: 02/13/1951   Medicare Important Message Given:  Yes    Kani Jobson Abena 08/26/2016, 9:45 AM

## 2016-08-26 NOTE — Clinical Social Work Note (Signed)
30-day note for PASARR on chart for MD's signature. CSW paged MD to make him aware.  Charlynn CourtSarah Kailei Cowens, CSW [phone]

## 2016-08-26 NOTE — Progress Notes (Signed)
Occupational therapy evaluation - late entry   Pt presents to OT with the below listed deficits.  He was very lethargic during eval, and required total A +2 for all bed mobility and total A to maintain EOB sitting.  He did initiate reach x 1 with Rt UE, but no other interaction noted.  Unsure of pt's baseline, but per note in chart, he stood from dining room table on day of admission, so anticipate he was at least able to transfer with assist, and engage minimally with his environment.  He was from ALF with plan for SNF.     08/25/16 1700  OT Visit Information  Last OT Received On 08/25/16  Assistance Needed +2  PT/OT/SLP Co-Evaluation/Treatment Yes  Reason for Co-Treatment Complexity of the patient's impairments (multi-system involvement);For patient/therapist safety  OT goals addressed during session ADL's and self-care;Strengthening/ROM  History of Present Illness 65 y.o. 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  EMS found him lethargic, congested, and hypoxic at 88% on room air.  In the ED he was found to be hypothermic and CXR showed atelectasis vs opacity in the right base  Precautions  Precautions Fall  Home Living  Family/patient expects to be discharged to: Skilled nursing facility  Additional Comments Pt recently resided at [facility], which has closed   Prior Function  Level of Independence Needs assistance  Gait / Transfers Assistance Needed Assistance for transfers; Not sure if he was ambulatory here recently; noted in PT note late 2015 he walked without an assistive device at that time (more than a year ago)  ADL's / Air traffic controllerHomemaking Assistance Needed Assistance from ALF staff; incontinent, but able to stand with assist for changing  Communication  Communication Expressive difficulties  Pain Assessment  Pain Assessment Faces  Pain Score 8  Pain Location Rt knee with  movement   Pain Descriptors / Indicators Guarding;Moaning  Pain Intervention(s) Monitored during session;Repositioned  Cognition  Arousal/Alertness Lethargic  Behavior During Therapy Flat affect  Overall Cognitive Status Difficult to assess  Difficult to assess due to Level of arousal (and no family present to provide baseline info )  Upper Extremity Assessment  Upper Extremity Assessment Difficult to assess due to impaired cognition (Pt able to reach to ~65* to retrieve item with Rt UE )  Lower Extremity Assessment  Lower Extremity Assessment Defer to PT evaluation  Cervical / Trunk Assessment  Cervical / Trunk Assessment Kyphotic;Other exceptions  Cervical / Trunk Exceptions Pt with capital extension.    ADL  Overall ADL's  Needs assistance/impaired  Grooming Wash/dry hands;Wash/dry face;Oral care;Brushing hair;Total assistance;Sitting  Upper Body Bathing Total assistance;Sitting;Bed level  Lower Body Bathing Total assistance;Bed level  Upper Body Dressing  Total assistance;Bed level  Lower Body Dressing Total assistance;Bed level  Toilet Transfer Total assistance  Toilet Transfer Details (indicate cue type and reason) unable to safely attempt   Toileting- Clothing Manipulation and Hygiene Total assistance;Bed level  Functional mobility during ADLs Total assistance;+2 for physical assistance  General ADL Comments Unable to engage pt in ADL tasks.  Very lethargic during eval   Bed Mobility  Overal bed mobility +2 for physical assistance;Needs Assistance  Bed Mobility Rolling;Supine to Sit;Sidelying to Sit  Rolling Total assist;+2 for physical assistance  Sidelying to sit Total assist;+2 for physical assistance  Sit to supine Total assist;+2 for physical assistance  General bed mobility comments no attempts to assist with  lethargy  Transfers  General transfer comment unable to attempt   Balance  Overall balance assessment Needs assistance  Sitting-balance support Feet  supported;Single extremity supported  Sitting balance-Leahy Scale Zero  Sitting balance - Comments assist to arouse, pt with minimal attempts to reach for rail on R for balance and though at one point awake and self supporting momentarily leaned back and needed assist for preventing LOB, sat EOB about 15 minutes and RT in to perform percussion in sitting; also at one point reached up with R hand for obtaining brightly colored card  Postural control Posterior lean  OT - End of Session  Activity Tolerance Patient limited by lethargy  Patient left in bed;with call bell/phone within reach;with bed alarm set;with nursing/sitter in room  Nurse Communication Mobility status  OT Assessment  OT Recommendation/Assessment Patient needs continued OT Services  OT Problem List Decreased strength;Decreased activity tolerance;Impaired balance (sitting and/or standing);Decreased cognition;Decreased safety awareness;Decreased knowledge of use of DME or AE;Impaired UE functional use  Barriers to Discharge Decreased caregiver support  OT Plan  OT Frequency (ACUTE ONLY) Min 2X/week  OT Treatment/Interventions (ACUTE ONLY) Self-care/ADL training;Therapeutic exercise;Therapeutic activities;Cognitive remediation/compensation;Patient/family education;Balance training;DME and/or AE instruction;Neuromuscular education  OT Recommendation  Follow Up Recommendations SNF  OT Equipment None recommended by OT  Individuals Consulted  Consulted and Agree with Results and Recommendations Patient unable/family or caregiver not available  Acute Rehab OT Goals  OT Goal Formulation Patient unable to participate in goal setting  Time For Goal Achievement 09/09/16  Potential to Achieve Goals Fair  OT Time Calculation  OT Start Time (ACUTE ONLY) 1456  OT Stop Time (ACUTE ONLY) 1519  OT Time Calculation (min) 23 min  OT General Charges  $OT Visit 1 Procedure  OT Evaluation  $OT Eval Moderate Complexity 1 Procedure  Written  Expression  Dominant Hand Right  Jeani HawkingWendi Deniz Eskridge, OTR/L [phone]

## 2016-08-26 NOTE — Progress Notes (Signed)
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  EMS found him lethargic, congested, and hypoxic at 88% on room air.  In the ED he was found to be hypothermic and CXR showed atelectasis vs opacity in the right base  He has been admitted twice in the past year under similar circumstances. The first time in January he had mild AKI, and improved with fluids. The second time in March he was hypothermic but without any focal signs of infection and his TSH, cortisol, and lactate were normal as was a flu swab.  He was given fluids and he returned completely to normal with just supportive care.   Subjective: 11/16  patient's eyes open fracture around the room. Nonverbal     Assessment & Plan:   Principal Problem:   Sepsis (HCC) Active Problems:   DVT (deep venous thrombosis) (HCC)   CKD (chronic kidney disease) stage 3, GFR 30-59 ml/min   Mental retardation   Absolute anemia   Sepsis due to pneumonia (HCC)   Rhinovirus infection   Acute renal failure with acute tubular necrosis superimposed on stage 3 chronic kidney disease (HCC)   Hypotensive episode   Other specified hypothyroidism   Septic shock due to positive strep pneumo Pneumonia +/- Rhinovirus pneumonitis  -Clinically remains in a precarious situation with persisting hypotension and intermittent severe hypoxemia  - continue conservative medical care as per Dr. Sharon Seller discussion with his power of attorney  - not a candidate for BiPAP given his heavy secretions and his inability to control them on his own  - admin med tx to assure patient not anxious or suffering with uncontrolled pain or air hunger -Physiotherapy vest QID  Hemoptysis -Patient with thick purulent/bloody sputum with  vigorous pulmonary toilet. PCXR does not look improved however physical exam significantly improved. Recent Labs Lab 08/22/16 0049 08/23/16 0428 08/24/16 0405 08/26/16 0413  HGB 11.1* 9.9* 9.4* 9.2*  -any drop in H/H or respiratory distress will require immediate CT scan for active pulmonary hemorrhage. -Spoke with pharmacy will switch to Heparin and hold at minimal therapeutic level   Altered mental status -Cognition improved. But still unable to follow commands. Possible aspiration when attempted to be fed today. Nothing by mouth until patient passes swallow eval or until NG tube placed.  Dysphagia -See altered mental status  Acute kidney failure on CKD stage 3(Baseline Cr ~2)  - creatinine climbing in setting of severe sepsis/septic shock Lab Results  Component Value Date   CREATININE 2.74 (H) 08/26/2016   CREATININE 3.03 (H) 08/25/2016   CREATININE 3.26 (H) 08/24/2016   Chronic DVT -On Xarelto as outpt. See hemoptysis,  HTN/Hypotensive -Continues to be hypotensive however in accordance with above will not increase aggressive measures i.e. pressors.   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  Subacute hypothyroidism -TSH 5.3, and free T4 0.81  Hypernatremia -D5W  50 ml/hr  Decubitus ulcer stage II right buttocks -     DVT prophylaxis: Minimal therapeutic Heparin Code Status: DO NOT RESUSCITATE Family Communication: None Disposition Plan: Guarded   Consultants:  None   Procedures/Significant Events:  None   Cultures 11/12 urine negative  11/12 blood left arm/right hand NGTD 11/12 MRSA by PCR negative 11/12 positive respiratory floor 11/12 respiratory virus panel positive  Rhinovirus/Enterovirus 11/12 positive strep pneumo urine antigen    Antimicrobials: Azithromycin 11/11 > 11/12 Ceftriaxone 11/11 > 11/12 Maxipime 11/12 > Vanc 11/12 > 11/14   Devices None    LINES / TUBES:  None    Continuous  Infusions: . dextrose 50 mL/hr at 08/25/16 1522     Objective: Vitals:   08/26/16 0751 08/26/16 0800 08/26/16 1214 08/26/16 1504  BP:  109/69 (!) 120/103 103/71  Pulse:  (!) 48 (!) 48 (!) 48  Resp:  15 12 14   Temp:  97.9 F (36.6 C) 97.7 F (36.5 C) 97.7 F (36.5 C)  TempSrc:  Axillary Axillary Axillary  SpO2: 100% 98% 100% 100%  Weight:      Height:        Intake/Output Summary (Last 24 hours) at 08/26/16 1714 Last data filed at 08/26/16 1700  Gross per 24 hour  Intake             1250 ml  Output              701 ml  Net              549 ml   Filed Weights   08/22/16 0550  Weight: 91.2 kg (201 lb 1 oz)    Examination:  General:patient's eyes open fracture around the room. Nonverbal, positive acute respiratory distress Eyes: negative scleral hemorrhage, negative anisocoria, negative icterus ENT: Negative Runny nose, negative gingival bleeding, Neck:  Negative scars, masses, torticollis, lymphadenopathy, JVD Lungs: Mild Diffuse rhonchi (significantly improved), decreased breath sounds bibasilar Rt>> Lt, without wheezes or crackles. Purulent sputum + hemoptysis in patient suction tubing Cardiovascular: Tachycardic, Regular rhythm without murmur gallop or rub normal S1 and S2 Abdomen: negative abdominal pain, nondistended, positive soft, bowel sounds, no rebound, no ascites, no appreciable mass Extremities: No significant cyanosis, clubbing, or edema bilateral lower extremities Skin: Negative rashes, lesions, ulcers Psychiatric:  Unable to evaluate Central nervous system:  Unable to evaluate  .     Data Reviewed: Care during the described time interval was provided by me .  I have reviewed this patient's available data, including medical history, events of note, physical examination, and all test results as part of my evaluation. I have personally reviewed and interpreted all radiology studies.  CBC:  Recent Labs Lab 08/22/16 0049 08/23/16 0428 08/24/16 0405  08/26/16 0413  WBC 5.8 17.3* 11.1* 11.3*  NEUTROABS 4.2  --   --   --   HGB 11.1* 9.9* 9.4* 9.2*  HCT 34.0* 30.1* 28.2* 28.3*  MCV 84.6 84.8 83.2 85.0  PLT 114* 93* 65* 106*   Basic Metabolic Panel:  Recent Labs Lab 08/22/16 0125 08/23/16 0428 08/24/16 0405 08/25/16 0532 08/26/16 0413  NA 144 147* 151* 151* 150*  K 4.7 5.4* 4.9 4.7 4.7  CL 109 115* 120* 116* 119*  CO2 26 25 24 26 26   GLUCOSE 123* 79 75 99 108*  BUN 44* 47* 57* 56* 56*  CREATININE 1.68* 2.57* 3.26* 3.03* 2.74*  CALCIUM 9.6 8.4* 8.7* 9.1 8.9  MG  --   --   --  2.5*  --    GFR: Estimated Creatinine Clearance: 30.3 mL/min (by C-G formula based on SCr of 2.74 mg/dL (H)). Liver Function Tests:  Recent Labs Lab 08/22/16 0125 08/23/16 0428 08/24/16 0405 08/26/16 0413  AST 42* 37 63* 62*  ALT 44 39 37 32  ALKPHOS 83 67 63 75  BILITOT 0.4 0.7 0.8 0.6  PROT 7.5 6.0* 6.1* 6.3*  ALBUMIN 3.3* 2.4* 2.4* 2.5*   No results for input(s): LIPASE, AMYLASE in the last 168 hours. No results for input(s): AMMONIA in the last 168 hours. Coagulation Profile: No results for input(s): INR, PROTIME in the last 168 hours. Cardiac Enzymes: No results for input(s): CKTOTAL, CKMB, CKMBINDEX, TROPONINI in the last 168 hours. BNP (last 3 results) No results for input(s): PROBNP in the last 8760 hours. HbA1C: No results for input(s): HGBA1C in the last 72 hours. CBG:  Recent Labs Lab 08/22/16 0119 08/25/16 1305 08/25/16 1528 08/26/16 1636  GLUCAP 97 99 99 80   Lipid Profile: No results for input(s): CHOL, HDL, LDLCALC, TRIG, CHOLHDL, LDLDIRECT in the last 72 hours. Thyroid Function Tests: No results for input(s): TSH, T4TOTAL, FREET4, T3FREE, THYROIDAB in the last 72 hours. Anemia Panel: No results for input(s): VITAMINB12, FOLATE, FERRITIN, TIBC, IRON, RETICCTPCT in the last 72 hours. Urine analysis:    Component Value Date/Time   COLORURINE YELLOW 08/22/2016 0049   APPEARANCEUR CLEAR 08/22/2016 0049    LABSPEC 1.016 08/22/2016 0049   PHURINE 5.0 08/22/2016 0049   GLUCOSEU NEGATIVE 08/22/2016 0049   HGBUR NEGATIVE 08/22/2016 0049   BILIRUBINUR NEGATIVE 08/22/2016 0049   KETONESUR NEGATIVE 08/22/2016 0049   PROTEINUR NEGATIVE 08/22/2016 0049   UROBILINOGEN 0.2 06/18/2014 1147   NITRITE NEGATIVE 08/22/2016 0049   LEUKOCYTESUR NEGATIVE 08/22/2016 0049   Sepsis Labs: @LABRCNTIP (procalcitonin:4,lacticidven:4)  ) Recent Results (from the past 240 hour(s))  Urine culture     Status: None   Collection Time: 08/22/16  1:07 AM  Result Value Ref Range Status   Specimen Description URINE, RANDOM  Final   Special Requests NONE  Final   Culture NO GROWTH  Final   Report Status 08/23/2016 FINAL  Final  Blood Culture (routine x 2)     Status: None (Preliminary result)   Collection Time: 08/22/16  1:15 AM  Result Value Ref Range Status   Specimen Description BLOOD LEFT ARM  Final   Special Requests BOTTLES DRAWN AEROBIC AND ANAEROBIC 5CC EA  Final   Culture NO GROWTH 4 DAYS  Final   Report Status PENDING  Incomplete  Blood Culture (routine x 2)     Status: None (Preliminary result)   Collection Time: 08/22/16  1:16 AM  Result Value Ref Range Status   Specimen Description BLOOD RIGHT HAND  Final   Special Requests BOTTLES DRAWN AEROBIC AND ANAEROBIC 5CC EA  Final   Culture NO GROWTH 4 DAYS  Final   Report Status PENDING  Incomplete  Respiratory Panel by PCR     Status: Abnormal   Collection Time: 08/22/16  6:00 AM  Result Value Ref Range Status   Adenovirus NOT DETECTED NOT DETECTED Final   Coronavirus 229E NOT DETECTED NOT DETECTED Final   Coronavirus HKU1 NOT DETECTED NOT DETECTED Final   Coronavirus NL63 NOT DETECTED NOT DETECTED Final   Coronavirus OC43 NOT DETECTED NOT DETECTED Final   Metapneumovirus NOT DETECTED NOT DETECTED Final   Rhinovirus / Enterovirus DETECTED (A) NOT DETECTED Final   Influenza A NOT DETECTED NOT DETECTED Final   Influenza B NOT DETECTED NOT DETECTED Final    Parainfluenza Virus 1 NOT DETECTED NOT DETECTED Final   Parainfluenza Virus 2 NOT DETECTED NOT DETECTED Final   Parainfluenza Virus 3 NOT DETECTED NOT DETECTED Final   Parainfluenza Virus 4 NOT DETECTED NOT DETECTED Final   Respiratory Syncytial Virus NOT DETECTED NOT DETECTED Final   Bordetella pertussis NOT DETECTED NOT DETECTED Final  Chlamydophila pneumoniae NOT DETECTED NOT DETECTED Final   Mycoplasma pneumoniae NOT DETECTED NOT DETECTED Final  MRSA PCR Screening     Status: None   Collection Time: 08/22/16  6:00 AM  Result Value Ref Range Status   MRSA by PCR NEGATIVE NEGATIVE Final    Comment:        The GeneXpert MRSA Assay (FDA approved for NASAL specimens only), is one component of a comprehensive MRSA colonization surveillance program. It is not intended to diagnose MRSA infection nor to guide or monitor treatment for MRSA infections.   Culture, respiratory (NON-Expectorated)     Status: None   Collection Time: 08/22/16  3:04 PM  Result Value Ref Range Status   Specimen Description TRACHEAL ASPIRATE  Final   Special Requests NONE  Final   Gram Stain   Final    ABUNDANT WBC PRESENT,BOTH PMN AND MONONUCLEAR MODERATE SQUAMOUS EPITHELIAL CELLS PRESENT MODERATE GRAM POSITIVE COCCI IN PAIRS FEW GRAM POSITIVE RODS RARE GRAM NEGATIVE RODS RARE GRAM NEGATIVE COCCI IN PAIRS    Culture Consistent with normal respiratory flora.  Final   Report Status 08/24/2016 FINAL  Final         Radiology Studies: Dg Chest Port 1 View  Result Date: 08/26/2016 CLINICAL DATA:  Pneumonia EXAM: PORTABLE CHEST 1 VIEW COMPARISON:  08/23/2016 FINDINGS: Diffuse bilateral airspace disease has improved in the interval. Bibasilar atelectasis also improved. Right pleural effusion improved. Findings most consistent with clearing heart failure or edema. Gas shadow overlying the right heart was not present on prior studies and is of uncertain etiology. This could be in the esophagus or hiatal  hernia however is not seen on prior studies. If the patient has had acute changes symptoms, consider chest CT to determine the localization of this gas. IMPRESSION: Interval improvement in bilateral airspace disease. Improvement in bibasilar atelectasis and right effusion Gas shadow overlying the right heart border of uncertain etiology. Follow-up recommended. CT if the patient has had acute interval change. Electronically Signed   By: Marlan Palau M.D.   On: 08/26/2016 09:17        Scheduled Meds: . ceFEPime (MAXIPIME) IV  1 g Intravenous Q24H  . enoxaparin (LOVENOX) injection  90 mg Subcutaneous Q24H  . hydrocortisone sod succinate (SOLU-CORTEF) inj  50 mg Intravenous Q12H  . levalbuterol  0.63 mg Nebulization TID  . mouth rinse  15 mL Mouth Rinse BID   Continuous Infusions: . dextrose 50 mL/hr at 08/25/16 1522     LOS: 4 days    Time spent: 40 minutes    Dshawn Mcnay, Roselind Messier, MD [facility] Pager [phone]   If 7PM-7AM, please contact night-coverage [email or web address] Password [email or web address] 08/26/2016, 5:14 PM

## 2016-08-26 NOTE — [place] FL2 (Signed)
[place] MEDICAID FL2 LEVEL OF CARE SCREENING TOOL     IDENTIFICATION  Patient Name: [name] Birthdate: 09/21/1951 Sex: male Admission Date (Current Location): 08/22/2016  [facility] [place]   Facility and Address:  [facility], [address], [place], [place] [address]      Provider Number: [id number]  Attending Physician Name and Address:  Drema Dallasurtis J Woods, MD  Relative Name and Phone Number:       Current Level of Care: Hospital Recommended Level of Care: Skilled Nursing Facility Prior Approval Number:    Date Approved/Denied:   PASRR Number: Pending  Discharge Plan: SNF    Current Diagnoses: Patient Active Problem List   Diagnosis Date Noted  . Sepsis due to pneumonia (HCC)   . Rhinovirus infection   . Acute renal failure with acute tubular necrosis superimposed on stage 3 chronic kidney disease (HCC)   . Hypotensive episode   . Other specified hypothyroidism   . Sepsis (HCC) 08/22/2016  . Chronic diastolic CHF (congestive heart failure) (HCC) 12/09/2015  . Mild aortic stenosis 12/09/2015  . Failure to thrive in adult 12/09/2015  . Absolute anemia   . DVT (deep venous thrombosis) (HCC) 06/11/2015  . CKD (chronic kidney disease) stage 3, GFR 30-59 ml/min 06/11/2015  . Mental retardation 06/11/2015  . Dementia 06/19/2014  . HLD (hyperlipidemia) 06/19/2014  . Leg edema 07/09/2011  . Hypertension     Orientation RESPIRATION BLADDER Height & Weight     Self  O2 (Nasal Canula 2 L) Incontinent, External catheter Weight: 201 lb 1 oz (91.2 kg) Height:  6' (182.9 cm)  BEHAVIORAL SYMPTOMS/MOOD NEUROLOGICAL BOWEL NUTRITION STATUS  Stable/cooperative. Does get anxious due to new environment   Developmental Delay, MR. Incontinent Dysphagia 2 (Fine chop) solids;Nectar thick liquid Liquid Administration via: Cup;Straw   Medication Administration: Whole meds with puree   Supervision: Staff to assist with self  feeding;Full supervision/cueing for compensatory strategies   Compensations: Slow rate;Small sips/bites   Postural Changes: Seated upright at 90 degrees   Oral Care Recommendations: Oral care BID   Other Recommendations: Order thickener from pharmacy;Prohibited food (jello, ice cream, thin soups);Remove water pitcher   AMBULATORY STATUS COMMUNICATION OF NEEDS Skin   Extensive Assist Non-Verbally Bruising, PU Stage and Appropriate Care   PU Stage 2 Dressing:  (Left sacrum. Foam prn.)                   Personal Care Assistance Level of Assistance          Total Care Assistance: Maximum assistance   Functional Limitations Info  Sight, Hearing, Speech Sight Info: Adequate Hearing Info: Adequate Speech Info: Impaired    SPECIAL CARE FACTORS FREQUENCY  PT (By licensed PT), OT (By licensed OT), Blood pressure    Speech Therapy      PT Frequency: 5 x week OT Frequency: 5 x week  5x week          Contractures Contractures Info: Not present    Additional Factors Info  Code Status, Allergies, Psychotropic Code Status Info: DNR Allergies Info: NKDA Psychotropic Info: Mental retardation         Current Medications (08/26/2016):  This is the current hospital active medication list Current Facility-Administered Medications  Medication Dose Route Frequency Provider Last Rate Last Dose  . acetaminophen (TYLENOL) suppository 650 mg  650 mg Rectal Q6H PRN Alberteen Samhristopher P Danford, MD      . ceFEPIme (MAXIPIME) 1 g in  dextrose 5 % 50 mL IVPB  1 g Intravenous Q24H Lonia BloodJeffrey T McClung, MD   1 g at 08/25/16 2113  . dextrose 5 % solution   Intravenous Continuous Drema Dallasurtis J Woods, MD 50 mL/hr at 08/25/16 1522    . enoxaparin (LOVENOX) injection 90 mg  90 mg Subcutaneous Q24H Drema Dallasurtis J Woods, MD   90 mg at 08/25/16 1113  . hydrocortisone sodium succinate (SOLU-CORTEF) 100 MG injection 50 mg  50 mg Intravenous Q12H Lonia BloodJeffrey T McClung, MD   50 mg at 08/26/16 0925  . levalbuterol  (XOPENEX) nebulizer solution 0.63 mg  0.63 mg Nebulization TID Lonia BloodJeffrey T McClung, MD   0.63 mg at 08/26/16 0750  . levalbuterol (XOPENEX) nebulizer solution 0.63 mg  0.63 mg Nebulization Q3H PRN Lonia BloodJeffrey T McClung, MD      . LORazepam (ATIVAN) injection 0.5-1 mg  0.5-1 mg Intravenous Q3H PRN Lonia BloodJeffrey T McClung, MD      . MEDLINE mouth rinse  15 mL Mouth Rinse BID Lonia BloodJeffrey T McClung, MD   15 mL at 08/26/16 1000  . morphine 2 MG/ML injection 2-4 mg  2-4 mg Intravenous Q1H PRN Lonia BloodJeffrey T McClung, MD   2 mg at 08/22/16 2041  . ondansetron (ZOFRAN) injection 4 mg  4 mg Intravenous Q6H PRN Alberteen Samhristopher P Danford, MD         Discharge Medications: Please see discharge summary for a list of discharge medications.  Relevant Imaging Results:  Relevant Lab Results:   Additional Information SS#: [id number].   Has a guardian:   Johnsie Cancelrnestine Howell  [phone]  Margarito LinerSarah C Boswell, LCSW

## 2016-08-26 NOTE — Evaluation (Signed)
Physical Therapy Evaluation Patient Details Name: [name] MRN: [id number] DOB: 05/07/1951 Today's Date: 08/26/2016   History of Present Illness  65 y.o. 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  EMS found him lethargic, congested, and hypoxic at 88% on room air.  In the ED he was found to be hypothermic and CXR showed atelectasis vs opacity in the right base  Clinical Impression  Patient presents with dependencies for mobility due to decreased level of arousal, and weakness from illness and bedrest in addition to issues noted in PT problem list.  He will benefit from skilled PT in the acute setting to allow decreased burden of care in next venue.  Agree with SNF level rehab at this time.     Follow Up Recommendations SNF    Equipment Recommendations  None recommended by PT    Recommendations for Other Services       Precautions / Restrictions Precautions Precautions: Fall      Mobility  Bed Mobility Overal bed mobility: +2 for physical assistance;Needs Assistance Bed Mobility: Rolling;Sidelying to Sit;Sit to Supine Rolling: Total assist;+2 for physical assistance Sidelying to sit: Total assist;+2 for physical assistance   Sit to supine: Total assist;+2 for physical assistance   General bed mobility comments: no attempts to assist with lethargy  Transfers                 General transfer comment: unable  Ambulation/Gait                Stairs            Wheelchair Mobility    Modified Rankin (Stroke Patients Only)       Balance Overall balance assessment: Needs assistance   Sitting balance-Leahy Scale: Zero Sitting balance - Comments: assist to arouse, pt with minimal attempts to reach for rail on R for balance and though at one point awake and self supporting momentarily leaned back and needed assist for preventing LOB, sat EOB  about 15 minutes and RT in to perform percussion in sitting; also at one point reached up with R hand for obtaining brightly colored card Postural control: Posterior lean;Right lateral lean                                   Pertinent Vitals/Pain Pain Assessment: Faces Faces Pain Scale: Hurts whole lot Pain Location: R knee with PROM Pain Descriptors / Indicators: Moaning Pain Intervention(s): Monitored during session;Limited activity within patient's tolerance    Home Living Family/patient expects to be discharged to:: Skilled nursing facility                      Prior Function Level of Independence: Needs assistance   Gait / Transfers Assistance Needed: unsure of baseline for gait, currently with knee flexion contractures  ADL's / Homemaking Assistance Needed: Assistance from ALF staff; incontinent, but able to stand with assist for changing        Hand Dominance   Dominant Hand: Right    Extremity/Trunk Assessment   Upper Extremity Assessment: Difficult to assess due to impaired cognition           Lower Extremity Assessment: Difficult to assess due to impaired cognition;LLE deficits/detail;RLE deficits/detail RLE Deficits / Details: not moving actively due to level of arousal, knee flexion contractures bilaterally  about 20-30 degrees, painful with R knee ROM LLE Deficits / Details: not moving actively due to level of arousal, knee flexion contractures bilaterally about 20-30 degrees,  Cervical / Trunk Assessment: Kyphotic;Other exceptions  Communication   Communication: Expressive difficulties (nonverbal)  Cognition Arousal/Alertness: Lethargic Behavior During Therapy: Flat affect Overall Cognitive Status: Difficult to assess                      General Comments      Exercises     Assessment/Plan    PT Assessment Patient needs continued PT services  PT Problem List Decreased strength;Decreased activity tolerance;Decreased  balance;Decreased mobility;Decreased safety awareness;Decreased range of motion;Other (comment) (decreased arousal)          PT Treatment Interventions Functional mobility training;Balance training;Neuromuscular re-education;Therapeutic exercise;Patient/family education;Therapeutic activities    PT Goals (Current goals can be found in the Care Plan section)  Acute Rehab PT Goals Patient Stated Goal: Unable to state PT Goal Formulation: Patient unable to participate in goal setting Time For Goal Achievement: 09/09/16 Potential to Achieve Goals: Fair    Frequency Min 2X/week   Barriers to discharge        Co-evaluation PT/OT/SLP Co-Evaluation/Treatment: Yes Reason for Co-Treatment: Complexity of the patient's impairments (multi-system involvement);For patient/therapist safety PT goals addressed during session: Mobility/safety with mobility;Balance;Strengthening/ROM         End of Session   Activity Tolerance: Patient limited by lethargy Patient left: in bed;with call bell/phone within reach;with bed alarm set Nurse Communication: Mobility status         Time: [id number] PT Time Calculation (min) (ACUTE ONLY): 33 min   Charges:   PT Evaluation $PT Eval High Complexity: 1 Procedure     PT G CodesElray Mcgregor:        Flor Whitacre 08/26/2016, 8:23 AM  Sheran Lawlessyndi Mandrell Vangilder, PT [phone] 08/26/2016

## 2016-08-26 NOTE — Progress Notes (Signed)
ANTICOAGULATION CONSULT NOTE - Follow Up Consult  Pharmacy Consult for Lovenox >> Heparin Indication: Hx DVT  No Known Allergies  Patient Measurements: Height: 6' (182.9 cm) Weight: 201 lb 1 oz (91.2 kg) IBW/kg (Calculated) : 77.6 Heparin Dosing Weight: 92 kg  Vital Signs: Temp: 97.7 F (36.5 C) (11/16 2000) Temp Source: Axillary (11/16 2000) BP: 96/66 (11/16 2000) Pulse Rate: 60 (11/16 2000)  Labs:  Recent Labs  08/24/16 0405 08/25/16 0532 08/26/16 0413  HGB 9.4*  --  9.2*  HCT 28.2*  --  28.3*  PLT 65*  --  106*  CREATININE 3.26* 3.03* 2.74*    Estimated Creatinine Clearance: 30.3 mL/min (by C-G formula based on SCr of 2.74 mg/dL (H)).   Assessment: 65 YOM on Xarelto PTA for hx DVT - held this admit and transitioned to Lovenox. The patient was noted to have AKI on 11/13 AM - the dose of lovenox was adjusted from q12h dosing to q24h dosing on 11/14. The patient's SCr peaked at 3.26 on 11/14 and is now trended back down to 2.74 with an estimated CrCl~30 ml/min. Subsequently, the patient has developed some hemoptysis. Given the patient's AKI and possible accumulation of lovenox - it was discussed with the MD and plans are now to transition heparin and aim for a low goal of 0.3-0.5. Plans are to watch the hemoptysis and consider CT evaluation for pulmonary hemorrhage if condition worsens of H/H drops.   The patient received a dose of lovenox 90 mg at 1200 today. Will plan to start heparin 24 hours after the last dose was given.   Goal of Therapy:  Heparin level 0.3-0.5 units/ml Monitor platelets by anticoagulation protocol: Yes   Plan:  1. Start heparin at 1200 units/hr (12 ml/hr) starting at noon on 11/17 2. Will obtain a HL + aPTT with the initial level to check correlation given hx Xarelto use and likely accumulation of Lovenox 3. Daily CBC, HL 4. Will continue to monitor for any signs/symptoms of bleeding and will follow up with heparin level in 6 hours after the  drip is initiated.   Thank you for allowing pharmacy to be a part of this patient's care.  Georgina PillionElizabeth Jaci Desanto, PharmD, BCPS Clinical Pharmacist Pager: [phone] 08/26/2016 9:57 PM

## 2016-08-27 ENCOUNTER — Inpatient Hospital Stay (HOSPITAL_COMMUNITY): Payer: Medicare Other

## 2016-08-27 DIAGNOSIS — D696 Thrombocytopenia, unspecified: Secondary | ICD-10-CM | POA: Diagnosis present

## 2016-08-27 DIAGNOSIS — G92 Toxic encephalopathy: Secondary | ICD-10-CM

## 2016-08-27 DIAGNOSIS — G928 Other toxic encephalopathy: Secondary | ICD-10-CM | POA: Diagnosis present

## 2016-08-27 DIAGNOSIS — L8990 Pressure ulcer of unspecified site, unspecified stage: Secondary | ICD-10-CM | POA: Diagnosis present

## 2016-08-27 DIAGNOSIS — T148XXA Other injury of unspecified body region, initial encounter: Secondary | ICD-10-CM

## 2016-08-27 DIAGNOSIS — F0390 Unspecified dementia without behavioral disturbance: Secondary | ICD-10-CM

## 2016-08-27 LAB — CBC
HEMATOCRIT: 29.7 % — AB (ref 39.0–52.0)
Hemoglobin: 9.6 g/dL — ABNORMAL LOW (ref 13.0–17.0)
MCH: 27.8 pg (ref 26.0–34.0)
MCHC: 32.3 g/dL (ref 30.0–36.0)
MCV: 86.1 fL (ref 78.0–100.0)
PLATELETS: 118 10*3/uL — AB (ref 150–400)
RBC: 3.45 MIL/uL — AB (ref 4.22–5.81)
RDW: 19.5 % — AB (ref 11.5–15.5)
WBC: 9.4 10*3/uL (ref 4.0–10.5)

## 2016-08-27 LAB — CULTURE, BLOOD (ROUTINE X 2)
Culture: NO GROWTH
Culture: NO GROWTH

## 2016-08-27 LAB — APTT: aPTT: 62 seconds — ABNORMAL HIGH (ref 24–36)

## 2016-08-27 LAB — HEPARIN LEVEL (UNFRACTIONATED): Heparin Unfractionated: 0.66 IU/mL (ref 0.30–0.70)

## 2016-08-27 MED ORDER — HYDROCORTISONE NA SUCCINATE PF 100 MG IJ SOLR
50.0000 mg | Freq: Every day | INTRAMUSCULAR | Status: DC
  Administered 2016-08-28: 50 mg via INTRAVENOUS
  Filled 2016-08-27: qty 2

## 2016-08-27 MED ORDER — ACETYLCYSTEINE 20 % IN SOLN
2.0000 mL | Freq: Two times a day (BID) | RESPIRATORY_TRACT | Status: DC
  Administered 2016-08-27 – 2016-08-29 (×4): 2 mL via RESPIRATORY_TRACT
  Administered 2016-08-29 – 2016-08-30 (×2): 4 mL via RESPIRATORY_TRACT
  Administered 2016-08-30 – 2016-09-02 (×5): 2 mL via RESPIRATORY_TRACT
  Filled 2016-08-27 (×15): qty 4

## 2016-08-27 MED ORDER — ACETYLCYSTEINE 10% NICU INHALATION SOLUTION
2.0000 mL | Freq: Two times a day (BID) | RESPIRATORY_TRACT | Status: DC
  Filled 2016-08-27: qty 2

## 2016-08-27 NOTE — Evaluation (Signed)
Clinical/Bedside Swallow Evaluation Patient Details  Name: [name] MRN: [id number] Date of Birth: 03/25/1951  Today's Date: 08/27/2016 Time: SLP Start Time (ACUTE ONLY): 0830 SLP Stop Time (ACUTE ONLY): 0900 SLP Time Calculation (min) (ACUTE ONLY): 30 min  Past Medical History:  Past Medical History:  Diagnosis Date  . Dementia 06/19/2014  . DVT (deep venous thrombosis) (HCC)    right leg  . Dyslipidemia   . Hypertension   . Mental retardation   . Obesity   . Other and unspecified hyperlipidemia 06/19/2014   Past Surgical History: History reviewed. No pertinent surgical history. HPI:  65 y.o.BM PMHxSevere Mental Handicap(nonverbal at baseline), HTN, DVT on Xarelto, CKD, Mild Chronic Diastolic CHF. Who presented with cough and severe weakness. EMS found him lethargic, congested, and hypoxic at 88% on room air. In the ED he was found to be hypothermic and CXR showed atelectasis vs opacity in the right base. He has been admitted twice in the past year under similar circumstances. The first time in January he had mild AKI, and improved with fluids. The second time in March he was hypothermic but without any focal signs of infection and his TSH, cortisol, and lactate were normal as was a flu swab. He was given fluids and he returned completely to normal with just supportive care. Clinically remains in a precarious situation with persisting hypotension and intermittent severe hypoxemia, continue conservative medical care as per Dr. Sharon SellerMcClung discussion with his power of attorney, not a candidate for BiPAP given his heavy secretions and his inability to control them on his own. Physiotherapy vest QID.    Assessment / Plan / Recommendation Clinical Impression  Pt presents with severe oroparyngeal dysphagia d/t severe oral inattention to bolus and no swallow initiation felt with palpation of hyolaryngeal structure. Pt's O2 saturation dropped to 88% and RR increased RR to 34. Pt unable to  follow instructions for coughing. Pt with very weak cough that appeared to be continuous following 1 trial of ice chips. Given pt's decreased respiratory status and overt s/s of aspiration with trial of ice chip, pt is at high risk for aspiration. Therefore ST recommends pt remain NPO. ST to follow.     Aspiration Risk  Severe aspiration risk;Risk for inadequate nutrition/hydration    Diet Recommendation NPO   Medication Administration: Via alternative means Postural Changes: Seated upright at 90 degrees    Other  Recommendations Oral Care Recommendations: Oral care QID Other Recommendations: Remove water pitcher   Follow up Recommendations  (TBD)      Frequency and Duration min 2x/week  2 weeks       Prognosis Prognosis for Safe Diet Advancement: Fair Barriers to Reach Goals: Cognitive deficits;Severity of deficits      Swallow Study   General Date of Onset: 08/22/16 HPI: 65 y.o.BM PMHxSevere Mental Handicap(nonverbal at baseline), HTN, DVT on Xarelto, CKD, Mild Chronic Diastolic CHF. Who presented with cough and severe weakness. EMS found him lethargic, congested, and hypoxic at 88% on room air. In the ED he was found to be hypothermic and CXR showed atelectasis vs opacity in the right base. He has been admitted twice in the past year under similar circumstances. The first time in January he had mild AKI, and improved with fluids. The second time in March he was hypothermic but without any focal signs of infection and his TSH, cortisol, and lactate were normal as was a flu swab. He was given fluids and he returned completely to normal with just supportive care.  Clinically remains in a precarious situation with persisting hypotension and intermittent severe hypoxemia, continue conservative medical care as per Dr. Sharon SellerMcClung discussion with his power of attorney, not a candidate for BiPAP given his heavy secretions and his inability to control them on his own. Physiotherapy vest QID.   Type of Study: Bedside Swallow Evaluation Previous Swallow Assessment:  (06/19/2014 - BSE recommend dysphagia 3 with thin liquids) Diet Prior to this Study: NPO Temperature Spikes Noted: No Respiratory Status: Nasal cannula (6 liters) History of Recent Intubation: No Behavior/Cognition: Alert;Doesn't follow directions Oral Cavity Assessment: Within Functional Limits Oral Care Completed by SLP: Recent completion by staff Oral Cavity - Dentition: Adequate natural dentition Vision:  (Unable to assess) Self-Feeding Abilities: Needs assist Patient Positioning: Upright in bed Baseline Vocal Quality: Normal (Pt answered "yes" to several questions but largely nonverbal) Volitional Cough: Weak;Cognitively unable to elicit (Weak continuous coughing present at baseline) Volitional Swallow: Unable to elicit    Oral/Motor/Sensory Function Overall Oral Motor/Sensory Function: Generalized oral weakness Facial ROM:  (Unable to formally assess) Facial Symmetry: Within Functional Limits   Ice Chips Ice chips: Impaired Presentation: Spoon Oral Phase Impairments: Poor awareness of bolus Oral Phase Functional Implications: Oral holding Pharyngeal Phase Impairments: Unable to trigger swallow;Cough - Immediate;Change in Vital Signs   Thin Liquid Thin Liquid: Not tested    Nectar Thick Nectar Thick Liquid: Not tested   Honey Thick Honey Thick Liquid: Not tested   Puree Puree: Not tested   Solid   GO   Solid: Not tested       Taziah Difatta B. Dreama Saaverton, M.S., CCC-SLP Speech-Language Pathologist [phone] Gwendolyne Welford 08/27/2016,9:07 AM

## 2016-08-27 NOTE — Progress Notes (Signed)
ANTICOAGULATION CONSULT NOTE - Follow Up Consult  Pharmacy Consult for Heparin Indication: Hx DVT  No Known Allergies  Patient Measurements: Height: 6' (182.9 cm) Weight: 201 lb 1 oz (91.2 kg) IBW/kg (Calculated) : 77.6 Heparin Dosing Weight: 91  Vital Signs: Temp: 97.8 F (36.6 C) (11/17 1959) Temp Source: Oral (11/17 1959) BP: 111/72 (11/17 1959) Pulse Rate: 44 (11/17 1959)  Labs:  Recent Labs  08/25/16 0532 08/26/16 0413 08/27/16 0625 08/27/16 1840  HGB  --  9.2* 9.6*  --   HCT  --  28.3* 29.7*  --   PLT  --  106* 118*  --   APTT  --   --   --  62*  HEPARINUNFRC  --   --   --  0.66  CREATININE 3.03* 2.74*  --   --     Estimated Creatinine Clearance: 30.3 mL/min (by C-G formula based on SCr of 2.74 mg/dL (H)).   Assessment: 65yo male with hx DVT on Xarelto pta, last dose on 11/11.  He was bridged with Lovenox and then transitioned to heparin given AKI and some hemoptysis.  Cr has improved today, but remains above baseline Cr 1.6.  Per d/w RN, there has been no bleeding nor further hemoptysis.  Based on heparin level being above goal and PTT being subtherapeutic, it seems that Xarelto is still exerting some effect in falsely elevating the heparin level.  Goal of Therapy:  Heparin level 0.3-0.5 units/ml aPTT 66-102 seconds Monitor platelets by anticoagulation protocol: Yes   Plan:  Increase heparin to 1300 units/hr F/U AM heparin level and aPTT Watch for s/s of bleeding  Marisue HumbleKendra Shravan Salahuddin, PharmD Clinical Pharmacist [facility]

## 2016-08-27 NOTE — Progress Notes (Signed)
Switched patient from High  Flow nasal cannula to regular cannula with bubble humidifier.

## 2016-08-27 NOTE — Progress Notes (Signed)
Physical Therapy Treatment Patient Details Name: [name] MRN: [id number] DOB: 09/28/1951 Today's Date: 08/27/2016    History of Present Illness 65 y.o. 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  EMS found him lethargic, congested, and hypoxic at 88% on room air.  In the ED he was found to be hypothermic and CXR showed atelectasis vs opacity in the right base    PT Comments    Pt answering yes/no question better, he is alert and willing, but still very weak and help very minimally with bed mobility, sitting EOB and standing.  Follow Up Recommendations  SNF     Equipment Recommendations  None recommended by PT    Recommendations for Other Services       Precautions / Restrictions Precautions Precautions: Fall    Mobility  Bed Mobility Overal bed mobility: +2 for physical assistance;Needs Assistance Bed Mobility: Supine to Sit;Sit to Sidelying     Supine to sit: Total assist;+2 for physical assistance Sit to supine: Total assist;+2 for physical assistance   General bed mobility comments: pt didn't assist much, but pulled R arm into place to help a little, no help to lay down  Transfers Overall transfer level: Needs assistance Equipment used: None Transfers: Sit to/from Stand Sit to Stand: Total assist;+2 physical assistance         General transfer comment: unable  Ambulation/Gait                 Stairs            Wheelchair Mobility    Modified Rankin (Stroke Patients Only)       Balance Overall balance assessment: Needs assistance   Sitting balance-Leahy Scale: Poor Sitting balance - Comments: pt unable to maintain sitting EOB without minimal assist.  Head hanging with no real attempt to extend into upright posture.     Standing balance-Leahy Scale: Zero                      Cognition Arousal/Alertness:  Awake/alert Behavior During Therapy: Flat affect Overall Cognitive Status: History of cognitive impairments - at baseline (difficult to compare to baseline)                      Exercises      General Comments General comments (skin integrity, edema, etc.): Pt more alert and participative, but still needing total assist.  After standing trial, pt appeared to become less responsing.  Therapist returned pt to supine in case BP was falling, but supine BP 120/79.      Pertinent Vitals/Pain Pain Assessment: Faces Faces Pain Scale: No hurt    Home Living                      Prior Function            PT Goals (current goals can now be found in the care plan section) Acute Rehab PT Goals Patient Stated Goal: Unable to state PT Goal Formulation: Patient unable to participate in goal setting Time For Goal Achievement: 09/09/16 Potential to Achieve Goals: Fair Progress towards PT goals: Progressing toward goals    Frequency    Min 2X/week      PT Plan Current plan remains appropriate    Co-evaluation PT/OT/SLP Co-Evaluation/Treatment: Yes Reason for Co-Treatment: For patient/therapist safety;Complexity of the patient's impairments (multi-system involvement) PT  goals addressed during session: Mobility/safety with mobility       End of Session   Activity Tolerance: Patient tolerated treatment well;Patient limited by fatigue Patient left: in bed;with call bell/phone within reach;with bed alarm set     Time: 1437-1501 PT Time Calculation (min) (ACUTE ONLY): 24 min  Charges:  $Therapeutic Activity: 8-22 mins                    G CodesEliseo Gum:      Tyreak Reagle V Bijan Ridgley 08/27/2016, 3:53 PM 08/27/2016  Dilkon BingKen Marketta Valadez, PT [phone]  (pager)

## 2016-08-27 NOTE — Progress Notes (Signed)
Progress Note    [name]  MVH:[id number] DOB: 11/27/1950  DOA: 08/22/2016 PCP: Colon Branch, MD    Brief Narrative:   Chief complaint: Follow-up pneumonia  [name] is an 65 y.o. male with a PMH of severe developmental delay who is nonverbal at baseline, hypertension, DVT on Xarelto, chronic diastolic CHF, and stage III chronic kidney disease who was admitted 08/22/16 for evaluation of lethargy, congestion and hypoxia. He subsequently developed septic shock in the setting of pneumococcal pneumonia and a positive rhinovirus nasal swab. He has been hypoxic and is not a candidate for BiPAP due to his heavy secretions.  Assessment/Plan:   Principal Problem:   Sepsis (HCC)/Septic shock due to pneumococcal pneumonia and rhinovirus pneumonitis Seems a bit more stable today. Not a candidate for BiPAP secondary to heavy secretions and altered mental status. Continue physiotherapy vest 4 times a day and treatment with Maxipime. Wean stress dose steroids. Seen by speech therapist and is at high risk for aspiration so he remains nothing by mouth. Will add Mucomyst due to thick, copious secretions. Lactate cleared and WBC normalized. Continue Maxipime. Continue bronchodilators.  Active Problems:   Hypernatremia Increase IV fluids.    DVT (deep venous thrombosis) (HCC) Continue IV heparin. On Xarelto as an outpatient.    Mental retardation/dementia with superimposed toxic/metabolic encephalopathy SNF recommended status post PT evaluation. Mental status still altered. Abilify, Neurontin, Namenda, Ativan, Risperdal and Aricept remain on hold.    Absolute anemia Hemoglobin stable. No current indication for transfusion.    Acute renal failure with acute tubular necrosis superimposed on stage 3 chronic kidney disease (HCC) Baseline creatinine appears to be around 1.6-1.8. Current creatinine elevated over usual baseline values. Increase IV fluids and monitor. Avoid nephrotoxins.   Other specified hypothyroidism TSH 5.3, and free T4 0.81. Not on replacement therapy.    Pressure injury of skin Pressure reduction mattress per nursing.    Hematoma Monitor hemoglobin while on blood thinners.    Thrombocytopenia Unclear etiology, but possibly secondary to infection. Platelets rising.   Family Communication/Anticipated D/C date and plan/Code Status   DVT prophylaxis: Heparin ordered. Code Status: DO NOT RESUSCITATE. Family Communication: No family at the bedside. Disposition Plan: SNF when medically stable. Likely several more days.   Medical Consultants:    None.   Procedures:    None  Anti-Infectives:   Azithromycin 11/11 > 11/12 Ceftriaxone 11/11 > 11/12 Maxipime 11/12 > 11/19 Vanc 11/12 > 11/14   Subjective:   The patient is minimally verbal. Review of symptoms is negative for pain, dyspnea, nausea/vomiting.  Objective:    Vitals:   08/26/16 2300 08/27/16 0000 08/27/16 0400 08/27/16 0800  BP: 105/67 122/82 117/73 140/85  Pulse: (!) 58 (!) 59 67 (!) 58  Resp: 20 20 18 18   Temp: 98.5 F (36.9 C)  97.7 F (36.5 C) 98.7 F (37.1 C)  TempSrc: Oral  Axillary Oral  SpO2: 93% 92% 94% 93%  Weight:      Height:        Intake/Output Summary (Last 24 hours) at 08/27/16 0917 Last data filed at 08/27/16 0802  Gross per 24 hour  Intake          1001.67 ml  Output             1351 ml  Net          -349.33 ml   Filed Weights   08/22/16 0550  Weight: 91.2 kg (201 lb 1 oz)  Exam: General exam: Mildly restless.  Respiratory system: Diminished with a few faint rhonchi to auscultation. Respiratory effort Increased. Cardiovascular system: S1 & S2 heard, RRR. No JVD,  rubs, gallops or clicks. No murmurs. Gastrointestinal system: Abdomen is nondistended, soft and nontender. No organomegaly or masses felt. Normal bowel sounds heard. Central nervous system: Disoriented. No focal neurological deficits. Extremities: No clubbing,  or cyanosis.  No edema. Skin: Large hematoma right posterior thigh, 1-2+ pitting edema BLE. Psychiatry: Judgement and insight appear normal. Mood & affect appropriate.   Data Reviewed:   I have personally reviewed following labs and imaging studies:  Labs: Basic Metabolic Panel:  Recent Labs Lab 08/22/16 0125 08/23/16 0428 08/24/16 0405 08/25/16 0532 08/26/16 0413  NA 144 147* 151* 151* 150*  K 4.7 5.4* 4.9 4.7 4.7  CL 109 115* 120* 116* 119*  CO2 26 25 24 26 26   GLUCOSE 123* 79 75 99 108*  BUN 44* 47* 57* 56* 56*  CREATININE 1.68* 2.57* 3.26* 3.03* 2.74*  CALCIUM 9.6 8.4* 8.7* 9.1 8.9  MG  --   --   --  2.5*  --    GFR Estimated Creatinine Clearance: 30.3 mL/min (by C-G formula based on SCr of 2.74 mg/dL (H)). Liver Function Tests:  Recent Labs Lab 08/22/16 0125 08/23/16 0428 08/24/16 0405 08/26/16 0413  AST 42* 37 63* 62*  ALT 44 39 37 32  ALKPHOS 83 67 63 75  BILITOT 0.4 0.7 0.8 0.6  PROT 7.5 6.0* 6.1* 6.3*  ALBUMIN 3.3* 2.4* 2.4* 2.5*    CBC:  Recent Labs Lab 08/22/16 0049 08/23/16 0428 08/24/16 0405 08/26/16 0413 08/27/16 0625  WBC 5.8 17.3* 11.1* 11.3* 9.4  NEUTROABS 4.2  --   --   --   --   HGB 11.1* 9.9* 9.4* 9.2* 9.6*  HCT 34.0* 30.1* 28.2* 28.3* 29.7*  MCV 84.6 84.8 83.2 85.0 86.1  PLT 114* 93* 65* 106* 118*   CBG:  Recent Labs Lab 08/22/16 0119 08/25/16 1305 08/25/16 1528 08/26/16 1636  GLUCAP 97 99 99 80   Sepsis Labs:  Recent Labs Lab 08/22/16 0123 08/22/16 0411 08/22/16 0700 08/23/16 0428 08/24/16 0405 08/26/16 0413 08/27/16 0625  PROCALCITON  --   --  0.32  --   --   --   --   WBC  --   --   --  17.3* 11.1* 11.3* 9.4  LATICACIDVEN 2.11* 0.92 1.1  --   --   --   --     Microbiology Recent Results (from the past 240 hour(s))  Urine culture     Status: None   Collection Time: 08/22/16  1:07 AM  Result Value Ref Range Status   Specimen Description URINE, RANDOM  Final   Special Requests NONE  Final   Culture NO GROWTH  Final     Report Status 08/23/2016 FINAL  Final  Blood Culture (routine x 2)     Status: None (Preliminary result)   Collection Time: 08/22/16  1:15 AM  Result Value Ref Range Status   Specimen Description BLOOD LEFT ARM  Final   Special Requests BOTTLES DRAWN AEROBIC AND ANAEROBIC 5CC EA  Final   Culture NO GROWTH 4 DAYS  Final   Report Status PENDING  Incomplete  Blood Culture (routine x 2)     Status: None (Preliminary result)   Collection Time: 08/22/16  1:16 AM  Result Value Ref Range Status   Specimen Description BLOOD RIGHT HAND  Final   Special Requests  BOTTLES DRAWN AEROBIC AND ANAEROBIC 5CC EA  Final   Culture NO GROWTH 4 DAYS  Final   Report Status PENDING  Incomplete  Respiratory Panel by PCR     Status: Abnormal   Collection Time: 08/22/16  6:00 AM  Result Value Ref Range Status   Adenovirus NOT DETECTED NOT DETECTED Final   Coronavirus 229E NOT DETECTED NOT DETECTED Final   Coronavirus HKU1 NOT DETECTED NOT DETECTED Final   Coronavirus NL63 NOT DETECTED NOT DETECTED Final   Coronavirus OC43 NOT DETECTED NOT DETECTED Final   Metapneumovirus NOT DETECTED NOT DETECTED Final   Rhinovirus / Enterovirus DETECTED (A) NOT DETECTED Final   Influenza A NOT DETECTED NOT DETECTED Final   Influenza B NOT DETECTED NOT DETECTED Final   Parainfluenza Virus 1 NOT DETECTED NOT DETECTED Final   Parainfluenza Virus 2 NOT DETECTED NOT DETECTED Final   Parainfluenza Virus 3 NOT DETECTED NOT DETECTED Final   Parainfluenza Virus 4 NOT DETECTED NOT DETECTED Final   Respiratory Syncytial Virus NOT DETECTED NOT DETECTED Final   Bordetella pertussis NOT DETECTED NOT DETECTED Final   Chlamydophila pneumoniae NOT DETECTED NOT DETECTED Final   Mycoplasma pneumoniae NOT DETECTED NOT DETECTED Final  MRSA PCR Screening     Status: None   Collection Time: 08/22/16  6:00 AM  Result Value Ref Range Status   MRSA by PCR NEGATIVE NEGATIVE Final    Comment:        The GeneXpert MRSA Assay (FDA approved for  NASAL specimens only), is one component of a comprehensive MRSA colonization surveillance program. It is not intended to diagnose MRSA infection nor to guide or monitor treatment for MRSA infections.   Culture, respiratory (NON-Expectorated)     Status: None   Collection Time: 08/22/16  3:04 PM  Result Value Ref Range Status   Specimen Description TRACHEAL ASPIRATE  Final   Special Requests NONE  Final   Gram Stain   Final    ABUNDANT WBC PRESENT,BOTH PMN AND MONONUCLEAR MODERATE SQUAMOUS EPITHELIAL CELLS PRESENT MODERATE GRAM POSITIVE COCCI IN PAIRS FEW GRAM POSITIVE RODS RARE GRAM NEGATIVE RODS RARE GRAM NEGATIVE COCCI IN PAIRS    Culture Consistent with normal respiratory flora.  Final   Report Status 08/24/2016 FINAL  Final    Radiology: Dg Chest Port 1 View  Result Date: 08/26/2016 CLINICAL DATA:  Pneumonia EXAM: PORTABLE CHEST 1 VIEW COMPARISON:  08/23/2016 FINDINGS: Diffuse bilateral airspace disease has improved in the interval. Bibasilar atelectasis also improved. Right pleural effusion improved. Findings most consistent with clearing heart failure or edema. Gas shadow overlying the right heart was not present on prior studies and is of uncertain etiology. This could be in the esophagus or hiatal hernia however is not seen on prior studies. If the patient has had acute changes symptoms, consider chest CT to determine the localization of this gas. IMPRESSION: Interval improvement in bilateral airspace disease. Improvement in bibasilar atelectasis and right effusion Gas shadow overlying the right heart border of uncertain etiology. Follow-up recommended. CT if the patient has had acute interval change. Electronically Signed   By: Marlan Palauharles  Clark M.D.   On: 08/26/2016 09:17    Medications:   . ceFEPime (MAXIPIME) IV  1 g Intravenous Q24H  . hydrocortisone sod succinate (SOLU-CORTEF) inj  50 mg Intravenous Q12H  . levalbuterol  0.63 mg Nebulization TID  . mouth rinse  15 mL  Mouth Rinse BID   Continuous Infusions: . dextrose 50 mL/hr at 08/26/16 2300  . heparin  Medical decision making is of high complexity and this patient is at high risk of deterioration, therefore this is a level 3 visit.     LOS: 5 days   Jadrian Bulman  [facility] Pager [phone]. If unable to reach me by pager, please call my cell phone at [phone].  *Please refer to [email or web address], password [email or web address] to get updated schedule on who will round on this patient, as hospitalists switch teams weekly. If 7PM-7AM, please contact night-coverage at [email or web address], password [email or web address] for any overnight needs.  08/27/2016, 9:17 AM

## 2016-08-27 NOTE — Progress Notes (Signed)
Occupational Therapy Treatment Patient Details Name: [name] MRN: [id number] DOB: 07/17/1951 Today's Date: 08/27/2016    History of present illness 65 y.o. 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  EMS found him lethargic, congested, and hypoxic at 88% on room air.  In the ED he was found to be hypothermic and CXR showed atelectasis vs opacity in the right base   OT comments  Pt more alert today, and answering "yes" intermittently to questions.  He requires total A +2 for all mobility.  02 sats remained >94% on 5L supplemental 02  Follow Up Recommendations  SNF    Equipment Recommendations  None recommended by OT    Recommendations for Other Services      Precautions / Restrictions Precautions Precautions: Fall       Mobility Bed Mobility Overal bed mobility: +2 for physical assistance;Needs Assistance Bed Mobility: Supine to Sit;Sit to Sidelying     Supine to sit: Total assist;+2 for physical assistance Sit to supine: Total assist;+2 for physical assistance   General bed mobility comments: pt didn't assist much, but pulled R arm into place to help a little, no help to lay down  Transfers Overall transfer level: Needs assistance Equipment used: None Transfers: Sit to/from Stand Sit to Stand: Total assist;+2 physical assistance         General transfer comment: unable    Balance Overall balance assessment: Needs assistance   Sitting balance-Leahy Scale: Poor Sitting balance - Comments: pt unable to maintain sitting EOB without minimal assist.  Head hanging with no real attempt to extend into upright posture.     Standing balance-Leahy Scale: Zero                     ADL                                                Vision                     Perception     Praxis      Cognition   Behavior During  Therapy: Flat affect Overall Cognitive Status: No family/caregiver present to determine baseline cognitive functioning (Pt will intermittently answer "yes" to questions )                       Extremity/Trunk Assessment               Exercises     Shoulder Instructions       General Comments      Pertinent Vitals/ Pain       Pain Assessment: Faces Faces Pain Scale: No hurt  Home Living                                          Prior Functioning/Environment              Frequency  Min 2X/week        Progress Toward Goals  OT Goals(current goals can now be found in the care plan section)  Progress towards OT goals: Progressing toward goals (slowly )  Acute Rehab  OT Goals Patient Stated Goal: Unable to state  Plan Discharge plan remains appropriate    Co-evaluation    PT/OT/SLP Co-Evaluation/Treatment: Yes Reason for Co-Treatment: Complexity of the patient's impairments (multi-system involvement);For patient/therapist safety PT goals addressed during session: Mobility/safety with mobility OT goals addressed during session: ADL's and self-care;Strengthening/ROM      End of Session Equipment Utilized During Treatment: Oxygen   Activity Tolerance Patient limited by fatigue   Patient Left in bed;with call bell/phone within reach   Nurse Communication Mobility status        Time: [id number] OT Time Calculation (min): 26 min  Charges: OT General Charges $OT Visit: 1 Procedure OT Treatments $Therapeutic Activity: 8-22 mins  [name] M 08/27/2016, 7:30 PM

## 2016-08-27 NOTE — Progress Notes (Signed)
Chest PT completed at 8PM with hand percussors, and chest vest completed for 20 minutes with no return of sputum following treatment. May want to consider adding 48hr Mucomyst treatment along with chest vest. RT will continue to monitor, and will pass along importance of chest PT during the day.

## 2016-08-28 DIAGNOSIS — L89322 Pressure ulcer of left buttock, stage 2: Secondary | ICD-10-CM

## 2016-08-28 LAB — CBC
HCT: 27.3 % — ABNORMAL LOW (ref 39.0–52.0)
Hemoglobin: 8.8 g/dL — ABNORMAL LOW (ref 13.0–17.0)
MCH: 27.2 pg (ref 26.0–34.0)
MCHC: 32.2 g/dL (ref 30.0–36.0)
MCV: 84.3 fL (ref 78.0–100.0)
PLATELETS: 140 10*3/uL — AB (ref 150–400)
RBC: 3.24 MIL/uL — AB (ref 4.22–5.81)
RDW: 18.8 % — ABNORMAL HIGH (ref 11.5–15.5)
WBC: 12 10*3/uL — AB (ref 4.0–10.5)

## 2016-08-28 LAB — BASIC METABOLIC PANEL
Anion gap: 9 (ref 5–15)
BUN: 43 mg/dL — AB (ref 6–20)
CALCIUM: 8.7 mg/dL — AB (ref 8.9–10.3)
CO2: 24 mmol/L (ref 22–32)
Chloride: 115 mmol/L — ABNORMAL HIGH (ref 101–111)
Creatinine, Ser: 1.84 mg/dL — ABNORMAL HIGH (ref 0.61–1.24)
GFR calc Af Amer: 43 mL/min — ABNORMAL LOW (ref >60)
GFR, EST NON AFRICAN AMERICAN: 37 mL/min — AB (ref >60)
Glucose, Bld: 95 mg/dL (ref 65–99)
POTASSIUM: 3.8 mmol/L (ref 3.5–5.1)
SODIUM: 148 mmol/L — AB (ref 135–145)

## 2016-08-28 LAB — APTT: APTT: 79 s — AB (ref 24–36)

## 2016-08-28 LAB — HEPARIN LEVEL (UNFRACTIONATED): Heparin Unfractionated: 0.7 IU/mL (ref 0.30–0.70)

## 2016-08-28 NOTE — Progress Notes (Signed)
Cortrak team consulted to place NGT. RD was unable to place tube as pt became agitated and pushed RD away as soon as attempted first pass.. Each subsequent attempt was met with physical resistance. Pt is cognitively impaired and unable to explain the necessity of procedure. He could be restrained for the procedure,  but would undoubtedly pull tube as soon as procedure was over.   Pt has failed multiple swallow screens. If pt thought to ever be safe for PO intake, PEG would be needed for long term nutrition. However he is DNR and unsure if this is in line with GOC. Reccomend considering comfort care and allowance of oral feeding if PEG not considered option.   Potentially, team could try at later date if pt's disposition changes.   Will d/c cortrak consult.   Burtis Junes RD, LDN, CNSC Clinical Nutrition Pager: [phone] 08/28/2016 4:29 PM

## 2016-08-28 NOTE — Progress Notes (Signed)
Speech Language Pathology Treatment: Dysphagia  Patient Details Name: [name] Sheets MRN: [id number] DOB: 10/29/1950 Today's Date: 08/28/2016 Time: [id number] SLP Time Calculation (min) (ACUTE ONLY): 20 min  Assessment / Plan / Recommendation Clinical Impression  Pt with overt s/s of aspiration noted with immediate cough with ice chips via spoon with max multi-modal cueing provided by SLP; no swallow response noted during assessment despite max verbal/tactile cueing; pt stated "I can't swallow" during assessment; he responded to simple yes/no questions about 50% of time; he spoke in 1-2 word simple responses with SLP during re-assessment of swallowing.  Recommend NPO con't with consideration for non-oral nutrition d/t severity of oropharyngeal dysphagia at this point. ST will con't to f/u for possible PO intake and improvement in swallowing accuracy.   HPI HPI: 65 y.o.BM PMHxSevere Mental Handicap(nonverbal at baseline), HTN, DVT on Xarelto, CKD, Mild Chronic Diastolic CHF. Who presented with cough and severe weakness. EMS found him lethargic, congested, and hypoxic at 88% on room air. In the ED he was found to be hypothermic and CXR showed atelectasis vs opacity in the right base. He has been admitted twice in the past year under similar circumstances. The first time in January he had mild AKI, and improved with fluids. The second time in March he was hypothermic but without any focal signs of infection and his TSH, cortisol, and lactate were normal as was a flu swab. He was given fluids and he returned completely to normal with just supportive care. Clinically remains in a precarious situation with persisting hypotension and intermittent severe hypoxemia, continue conservative medical care as per Dr. Sharon SellerMcClung discussion with his power of attorney, not a candidate for BiPAP given his heavy secretions and his inability to control them on his own. Physiotherapy vest QID.       SLP Plan  Continue  with current plan of care     Recommendations  Diet recommendations: NPO Medication Administration: Via alternative means                Oral Care Recommendations: Oral care QID Follow up Recommendations: Other (comment) (TBD) Plan: Continue with current plan of care                       Miriah Maruyama,PAT, M.S., CCC-SLP 08/28/2016, 1:12 PM

## 2016-08-28 NOTE — Progress Notes (Signed)
Pharmacy Antibiotic Note  [name] is a 65 y.o. male admitted on 08/22/2016 with pneumonia.   Pharmacy is consulted to dose cefepime.    Today is D#7/8 ABX for PNA. WBC up to 12, on steroids , Afeb. Baseline Scr ~1.6;  SCr improving at 1.84/CrCl ~45. Cefepime dose still appropriate until CrCl >50.   Plan: Continue  Cefepime to 1g Q24h (d/c date entered for 11/19)  Monitor renal function and cultures F/u WBC, temp, clinical picture   Height: 6' (182.9 cm) Weight: 201 lb 1 oz (91.2 kg) IBW/kg (Calculated) : 77.6  Temp (24hrs), Avg:97.6 F (36.4 C), Min:96.6 F (35.9 C), Max:98 F (36.7 C)   Recent Labs Lab 08/22/16 0123  08/22/16 0411 08/22/16 0700 08/23/16 0428 08/24/16 0405 08/25/16 0532 08/26/16 0413 08/27/16 0625 08/28/16 0429  WBC  --   --   --   --  17.3* 11.1*  --  11.3* 9.4 12.0*  CREATININE  --   < >  --   --  2.57* 3.26* 3.03* 2.74*  --  1.84*  LATICACIDVEN 2.11*  --  0.92 1.1  --   --   --   --   --   --   < > = values in this interval not displayed.  Estimated Creatinine Clearance: 45.1 mL/min (by C-G formula based on SCr of 1.84 mg/dL (H)).    No Known Allergies   Antimicrobials this admission:  CTX 11/12 >> 11/12 Azithromycin 11/12 >> 11/12 Vanc 11/12 >>11/14 Cefepime 11/12 >> (11/19)  Dose adjustments this admission:  11/13 SCr increased 1.6>2.57, adjusted 1st Vanc dose to 1250mg  IV q24h and cefepime decreased to 1g q24h  Microbiology results:  11/12 BCx: ngtd x3 days 11/12 UCx: negative 11/12 MRSA PCR: negative 11/12 Respiratory Panel: Rhinovirus/Enterovirus detected 11/12: resp trach aspirate:normal flora/FINAL  Thank you for allowing pharmacy to be a part of this patient's care.  Allena Katzaroline E Welles, Pharm.D. PGY1 Pharmacy Resident 11/18/20179:33 AM Pager [phone]

## 2016-08-28 NOTE — Progress Notes (Signed)
ANTICOAGULATION CONSULT NOTE - Follow Up Consult  Pharmacy Consult for Heparin Indication: Hx DVT  No Known Allergies  Patient Measurements: Height: 6' (182.9 cm) Weight: 201 lb 1 oz (91.2 kg) IBW/kg (Calculated) : 77.6 Heparin Dosing Weight: 91  Vital Signs: Temp: 98 F (36.7 C) (11/18 0730) Temp Source: Axillary (11/18 0730) BP: 110/81 (11/18 0730) Pulse Rate: 64 (11/18 0730)  Labs:  Recent Labs  08/26/16 0413 08/27/16 0625 08/27/16 1840 08/28/16 0429  HGB 9.2* 9.6*  --  8.8*  HCT 28.3* 29.7*  --  27.3*  PLT 106* 118*  --  140*  APTT  --   --  62* 79*  HEPARINUNFRC  --   --  0.66 0.70  CREATININE 2.74*  --   --  1.84*    Estimated Creatinine Clearance: 45.1 mL/min (by C-G formula based on SCr of 1.84 mg/dL (H)).   Assessment: [age]yo male with hx DVT on Xarelto pta, last dose on 11/11.  He was bridged with Lovenox and then transitioned to heparin given AKI and some hemoptysis. Last dose lovenox 11/16. Cr has improved today, close to baseline Cr 1.6. Hgb with drop to 8.8, Plts up 140.  Per RN, no further hemoptysis.   Based on heparin level being above goal (shooting for more conservative heparin level goal of 0.3-0.5 given recent hemoptysis and AKI) and APTT being therapeutic, it seems that lovenox is still exerting some effect in falsely elevating the heparin level. Renal function greatly improved today and lovenox should be cleared by AM labs tomorrow.   Goal of Therapy:  Heparin level 0.3-0.5 units/ml aPTT 66-85 seconds Monitor platelets by anticoagulation protocol: Yes   Plan:  Continue heparin to 1300 units/hr F/U AM heparin level Watch for s/sx of bleeding F/u transition to PO AC  Allena Katzaroline E Lucyann Romano, Pharm.D. PGY1 Pharmacy Resident 11/18/20179:36 AM Pager [phone]

## 2016-08-28 NOTE — Progress Notes (Signed)
Progress Note    [name]  ZOX:[id number] DOB: 1951/08/29  DOA: 08/22/2016 PCP: Colon Branch, MD    Brief Narrative:   Chief complaint: Follow-up pneumonia  [name] is an 65 y.o. male with a PMH of severe developmental delay who is nonverbal at baseline, hypertension, DVT on Xarelto, chronic diastolic CHF, and stage III chronic kidney disease who was admitted 08/22/16 for evaluation of lethargy, congestion and hypoxia. He subsequently developed septic shock in the setting of pneumococcal pneumonia and a positive rhinovirus nasal swab. He has been hypoxic and is not a candidate for BiPAP due to his heavy secretions.  Assessment/Plan:   Principal Problem:   Sepsis (HCC)/Septic shock due to pneumococcal pneumonia and rhinovirus pneumonitis Condition has stabilized and he has been weaned to nasal cannula. Not a candidate for BiPAP secondary to heavy secretions and altered mental status. Continue physiotherapy vest 4 times a day and treatment with Maxipime. Weaning stress dose steroids. Seen by speech therapist and is at high risk for aspiration so he remains nothing by mouth. Continue Mucomyst. Lactate cleared and WBC normalized. Continue Maxipime through 08/29/16. Continue bronchodilators.  Active Problems:   Dysphagia Has failed ST evaluation and remains NPO.  To be re-evaluated today.  If diet cannot be advanced, will need to place a CorTrak for feeding.    Hypernatremia Improving with increased IV fluids, but still not back to normal. We'll increase D5W to 125 mL/hour.    DVT (deep venous thrombosis) (HCC) Continue IV heparin. On Xarelto as an outpatient. Resume Xarelto when able to take POs.    Mental retardation/dementia with superimposed toxic/metabolic encephalopathy SNF recommended status post PT evaluation. Mental status still altered. Abilify, Neurontin, Namenda, Ativan, Risperdal and Aricept remain on hold.    Absolute anemia Hemoglobin stable but watch  closely on blood thinners. No current indication for transfusion.    Acute renal failure with acute tubular necrosis superimposed on stage 3 chronic kidney disease (HCC) Baseline creatinine appears to be around 1.6-1.8. Creatinine has improved back to baseline values with increased IV fluids. Avoid nephrotoxins.    Other specified hypothyroidism TSH 5.3, and free T4 0.81. Not on replacement therapy.    Pressure injury of skin, stage II, left buttock Evaluated by wound care RN. Pressure reduction mattress per nursing. Continue soft silicone foam for insulation, protection, and management of drainage.    Hematoma right posterior thigh Monitor hemoglobin while on blood thinners. Slight drop in hemoglobin noted over the past 24 hours, continue to monitor closely.    Thrombocytopenia Unclear etiology, but possibly secondary to infection. Platelets rising.   Family Communication/Anticipated D/C date and plan/Code Status   DVT prophylaxis: Heparin ordered. Code Status: DO NOT RESUSCITATE. Family Communication: No family at the bedside. No next of kin noted in chart. Disposition Plan: SNF when medically stable. Likely another 24-48 hours. Lived in a group home prior to admission.   Medical Consultants:    None.   Procedures:    None  Anti-Infectives:   Azithromycin 11/11 > 11/12 Ceftriaxone 11/11 > 11/12 Maxipime 11/12 > 11/19 Vanc 11/12 > 11/14   Subjective:   The patient is minimally verbal. Review of symptoms is negative for pain, dyspnea, nausea/vomiting. RN reports that he has been disoriented.  Objective:    Vitals:   08/27/16 1959 08/27/16 2323 08/28/16 0345 08/28/16 0730  BP: 111/72 122/73  110/81  Pulse: (!) 44 (!) 48  64  Resp: 15 18  19   Temp: 97.8 F (36.6  C) (!) 96.6 F (35.9 C) 97.4 F (36.3 C) 98 F (36.7 C)  TempSrc: Oral Axillary Axillary Axillary  SpO2: 97% 99%  94%  Weight:      Height:        Intake/Output Summary (Last 24 hours) at  08/28/16 0839 Last data filed at 08/28/16 0735  Gross per 24 hour  Intake          1597.44 ml  Output             1400 ml  Net           197.44 ml   Filed Weights   08/22/16 0550  Weight: 91.2 kg (201 lb 1 oz)    Exam: General exam: No acute distress, sleepy.  Respiratory system: Diminished but clear. Respiratory effort normal. Cardiovascular system: S1 & S2 heard, RRR. No JVD,  rubs, gallops or clicks. No murmurs. Gastrointestinal system: Abdomen is nondistended, soft and nontender. No organomegaly or masses felt. Normal bowel sounds heard. Central nervous system: Disoriented. No focal neurological deficits. Extremities: No clubbing,  or cyanosis. 1+ edema. Skin: Large hematoma right posterior thigh. Psychiatry: Judgement and insight appear impaired. Mood & affect flat.   Data Reviewed:   I have personally reviewed following labs and imaging studies:  Labs: Basic Metabolic Panel:  Recent Labs Lab 08/23/16 0428 08/24/16 0405 08/25/16 0532 08/26/16 0413 08/28/16 0429  NA 147* 151* 151* 150* 148*  K 5.4* 4.9 4.7 4.7 3.8  CL 115* 120* 116* 119* 115*  CO2 25 24 26 26 24   GLUCOSE 79 75 99 108* 95  BUN 47* 57* 56* 56* 43*  CREATININE 2.57* 3.26* 3.03* 2.74* 1.84*  CALCIUM 8.4* 8.7* 9.1 8.9 8.7*  MG  --   --  2.5*  --   --    GFR Estimated Creatinine Clearance: 45.1 mL/min (by C-G formula based on SCr of 1.84 mg/dL (H)). Liver Function Tests:  Recent Labs Lab 08/22/16 0125 08/23/16 0428 08/24/16 0405 08/26/16 0413  AST 42* 37 63* 62*  ALT 44 39 37 32  ALKPHOS 83 67 63 75  BILITOT 0.4 0.7 0.8 0.6  PROT 7.5 6.0* 6.1* 6.3*  ALBUMIN 3.3* 2.4* 2.4* 2.5*    CBC:  Recent Labs Lab 08/22/16 0049 08/23/16 0428 08/24/16 0405 08/26/16 0413 08/27/16 0625 08/28/16 0429  WBC 5.8 17.3* 11.1* 11.3* 9.4 12.0*  NEUTROABS 4.2  --   --   --   --   --   HGB 11.1* 9.9* 9.4* 9.2* 9.6* 8.8*  HCT 34.0* 30.1* 28.2* 28.3* 29.7* 27.3*  MCV 84.6 84.8 83.2 85.0 86.1 84.3  PLT  114* 93* 65* 106* 118* 140*   CBG:  Recent Labs Lab 08/22/16 0119 08/25/16 1305 08/25/16 1528 08/26/16 1636  GLUCAP 97 99 99 80   Sepsis Labs:  Recent Labs Lab 08/22/16 0123 08/22/16 0411 08/22/16 0700  08/24/16 0405 08/26/16 0413 08/27/16 0625 08/28/16 0429  PROCALCITON  --   --  0.32  --   --   --   --   --   WBC  --   --   --   < > 11.1* 11.3* 9.4 12.0*  LATICACIDVEN 2.11* 0.92 1.1  --   --   --   --   --   < > = values in this interval not displayed.  Microbiology Recent Results (from the past 240 hour(s))  Urine culture     Status: None   Collection Time: 08/22/16  1:07 AM  Result Value Ref Range Status   Specimen Description URINE, RANDOM  Final   Special Requests NONE  Final   Culture NO GROWTH  Final   Report Status 08/23/2016 FINAL  Final  Blood Culture (routine x 2)     Status: None   Collection Time: 08/22/16  1:15 AM  Result Value Ref Range Status   Specimen Description BLOOD LEFT ARM  Final   Special Requests BOTTLES DRAWN AEROBIC AND ANAEROBIC 5CC EA  Final   Culture NO GROWTH 5 DAYS  Final   Report Status 08/27/2016 FINAL  Final  Blood Culture (routine x 2)     Status: None   Collection Time: 08/22/16  1:16 AM  Result Value Ref Range Status   Specimen Description BLOOD RIGHT HAND  Final   Special Requests BOTTLES DRAWN AEROBIC AND ANAEROBIC 5CC EA  Final   Culture NO GROWTH 5 DAYS  Final   Report Status 08/27/2016 FINAL  Final  Respiratory Panel by PCR     Status: Abnormal   Collection Time: 08/22/16  6:00 AM  Result Value Ref Range Status   Adenovirus NOT DETECTED NOT DETECTED Final   Coronavirus 229E NOT DETECTED NOT DETECTED Final   Coronavirus HKU1 NOT DETECTED NOT DETECTED Final   Coronavirus NL63 NOT DETECTED NOT DETECTED Final   Coronavirus OC43 NOT DETECTED NOT DETECTED Final   Metapneumovirus NOT DETECTED NOT DETECTED Final   Rhinovirus / Enterovirus DETECTED (A) NOT DETECTED Final   Influenza A NOT DETECTED NOT DETECTED Final    Influenza B NOT DETECTED NOT DETECTED Final   Parainfluenza Virus 1 NOT DETECTED NOT DETECTED Final   Parainfluenza Virus 2 NOT DETECTED NOT DETECTED Final   Parainfluenza Virus 3 NOT DETECTED NOT DETECTED Final   Parainfluenza Virus 4 NOT DETECTED NOT DETECTED Final   Respiratory Syncytial Virus NOT DETECTED NOT DETECTED Final   Bordetella pertussis NOT DETECTED NOT DETECTED Final   Chlamydophila pneumoniae NOT DETECTED NOT DETECTED Final   Mycoplasma pneumoniae NOT DETECTED NOT DETECTED Final  MRSA PCR Screening     Status: None   Collection Time: 08/22/16  6:00 AM  Result Value Ref Range Status   MRSA by PCR NEGATIVE NEGATIVE Final    Comment:        The GeneXpert MRSA Assay (FDA approved for NASAL specimens only), is one component of a comprehensive MRSA colonization surveillance program. It is not intended to diagnose MRSA infection nor to guide or monitor treatment for MRSA infections.   Culture, respiratory (NON-Expectorated)     Status: None   Collection Time: 08/22/16  3:04 PM  Result Value Ref Range Status   Specimen Description TRACHEAL ASPIRATE  Final   Special Requests NONE  Final   Gram Stain   Final    ABUNDANT WBC PRESENT,BOTH PMN AND MONONUCLEAR MODERATE SQUAMOUS EPITHELIAL CELLS PRESENT MODERATE GRAM POSITIVE COCCI IN PAIRS FEW GRAM POSITIVE RODS RARE GRAM NEGATIVE RODS RARE GRAM NEGATIVE COCCI IN PAIRS    Culture Consistent with normal respiratory flora.  Final   Report Status 08/24/2016 FINAL  Final    Radiology: Dg Chest Port 1 View  Result Date: 08/27/2016 CLINICAL DATA:  Shortness of breath EXAM: PORTABLE CHEST 1 VIEW COMPARISON:  08/26/2016 and multiple previous FINDINGS: Bilateral pulmonary infiltrates persist, similar to yesterday's study but improved compared to the study of 11/13. No worsening or new finding. No visible effusion. IMPRESSION: Persistent bilateral pulmonary infiltrates, unchanged since yesterday. Electronically Signed   By: Paulina FusiMark   Shogry  M.D.   On: 08/27/2016 09:50    Medications:   . acetylcysteine  2 mL Nebulization BID  . ceFEPime (MAXIPIME) IV  1 g Intravenous Q24H  . hydrocortisone sod succinate (SOLU-CORTEF) inj  50 mg Intravenous Daily  . levalbuterol  0.63 mg Nebulization TID  . mouth rinse  15 mL Mouth Rinse BID   Continuous Infusions: . dextrose 100 mL/hr at 08/28/16 0300  . heparin 1,300 Units/hr (08/28/16 0719)    Medical decision making is of high complexity and this patient is at high risk of deterioration, therefore this is a level 3 visit.     LOS: 6 days   Desirea Mizrahi  [facility] Pager [phone]. If unable to reach me by pager, please call my cell phone at [phone].  *Please refer to [email or web address], password [email or web address] to get updated schedule on who will round on this patient, as hospitalists switch teams weekly. If 7PM-7AM, please contact night-coverage at [email or web address], password [email or web address] for any overnight needs.  08/28/2016, 8:39 AM

## 2016-08-29 ENCOUNTER — Inpatient Hospital Stay (HOSPITAL_COMMUNITY): Payer: Medicare Other

## 2016-08-29 LAB — BASIC METABOLIC PANEL
ANION GAP: 7 (ref 5–15)
BUN: 31 mg/dL — ABNORMAL HIGH (ref 6–20)
CHLORIDE: 111 mmol/L (ref 101–111)
CO2: 26 mmol/L (ref 22–32)
Calcium: 8.8 mg/dL — ABNORMAL LOW (ref 8.9–10.3)
Creatinine, Ser: 1.75 mg/dL — ABNORMAL HIGH (ref 0.61–1.24)
GFR calc Af Amer: 46 mL/min — ABNORMAL LOW (ref >60)
GFR, EST NON AFRICAN AMERICAN: 40 mL/min — AB (ref >60)
GLUCOSE: 98 mg/dL (ref 65–99)
POTASSIUM: 3.6 mmol/L (ref 3.5–5.1)
Sodium: 144 mmol/L (ref 135–145)

## 2016-08-29 LAB — CBC
HEMATOCRIT: 28.4 % — AB (ref 39.0–52.0)
HEMOGLOBIN: 9.1 g/dL — AB (ref 13.0–17.0)
MCH: 27.2 pg (ref 26.0–34.0)
MCHC: 32 g/dL (ref 30.0–36.0)
MCV: 84.8 fL (ref 78.0–100.0)
PLATELETS: 224 10*3/uL (ref 150–400)
RBC: 3.35 MIL/uL — AB (ref 4.22–5.81)
RDW: 18.6 % — ABNORMAL HIGH (ref 11.5–15.5)
WBC: 12.4 10*3/uL — AB (ref 4.0–10.5)

## 2016-08-29 LAB — HEPARIN LEVEL (UNFRACTIONATED): Heparin Unfractionated: 0.56 IU/mL (ref 0.30–0.70)

## 2016-08-29 NOTE — Progress Notes (Signed)
ANTICOAGULATION CONSULT NOTE - Follow Up Consult  Pharmacy Consult for Heparin Indication: Hx DVT  No Known Allergies  Patient Measurements: Height: 6' (182.9 cm) Weight: 201 lb 1 oz (91.2 kg) IBW/kg (Calculated) : 77.6 Heparin Dosing Weight: 91  Vital Signs: Temp: 98 F (36.7 C) (11/19 0500) BP: 143/87 (11/19 0500) Pulse Rate: 110 (11/19 0823)  Labs:  Recent Labs  08/27/16 0625 08/27/16 1840 08/28/16 0429 08/29/16 0400  HGB 9.6*  --  8.8* 9.1*  HCT 29.7*  --  27.3* 28.4*  PLT 118*  --  140* 224  APTT  --  62* 79*  --   HEPARINUNFRC  --  0.66 0.70 0.56  CREATININE  --   --  1.84* 1.75*    Estimated Creatinine Clearance: 47.4 mL/min (by C-G formula based on SCr of 1.75 mg/dL (H)).   Assessment: 65yo male with hx DVT on Xarelto pta, last dose on 11/11.  He was bridged with Lovenox and then transitioned to heparin given AKI and some hemoptysis. Last dose lovenox 11/16.Hgb 9.1, Plts up 224.  Per RN, no further hemoptysis. RD unable to place Cortrak TF and pt failed swallow eval so unable to resume home DOAC. Creat 1.75  Heparin level therapeutic at 0.56 (shooting for more conservative heparin level goal of 0.3-0.5 given recent hemoptysis and AKI)    Goal of Therapy:  Heparin level 0.3-0.5 units/ml aPTT 66-85 seconds Monitor platelets by anticoagulation protocol: Yes   Plan:  Continue heparin at 1300 units/hr F/U AM heparin level, CBC Watch for s/sx of bleeding If unable to transition to PO DOAC consider back to LMWH for long term tx  Herby AbrahamMichelle T. Dalyn Kjos, Pharm.D. [phone] 08/29/2016 10:26 AM

## 2016-08-29 NOTE — Progress Notes (Signed)
Speech Language Pathology Treatment: Dysphagia  Patient Details Name: [name] MRN: [id number] DOB: 05/19/1951 Today's Date: 08/29/2016 Time: [id number] SLP Time Calculation (min) (ACUTE ONLY): 12 min  Assessment / Plan / Recommendation Clinical Impression  Swallowing function much improved from previous sessions based on skilled bedside observation. Patient able to orally transit bolus and initiate a swallow consistently. Oropharyngeal swallow appearing timely and with seemingly intact airway protection however baseline congested cough noted impacting observation. Cannot r/o aspiration in the setting of increased WOB and difficulty coordinating breath and swallow. In addition, cough weak and cannot  ensure reliability to clear aspirates if occurring. Recommend MBS to determine least restrictive diet.    HPI HPI: 65 y.o.BM PMHxSevere Mental Handicap(nonverbal at baseline), HTN, DVT on Xarelto, CKD, Mild Chronic Diastolic CHF. Who presented with cough and severe weakness. EMS found him lethargic, congested, and hypoxic at 88% on room air. In the ED he was found to be hypothermic and CXR showed atelectasis vs opacity in the right base. He has been admitted twice in the past year under similar circumstances. The first time in January he had mild AKI, and improved with fluids. The second time in March he was hypothermic but without any focal signs of infection and his TSH, cortisol, and lactate were normal as was a flu swab. He was given fluids and he returned completely to normal with just supportive care. Clinically remains in a precarious situation with persisting hypotension and intermittent severe hypoxemia, continue conservative medical care as per Dr. Sharon SellerMcClung discussion with his power of attorney, not a candidate for BiPAP given his heavy secretions and his inability to control them on his own. Physiotherapy vest QID.       SLP Plan  MBS     Recommendations  Diet recommendations:  NPO Medication Administration: Via alternative means                Oral Care Recommendations: Oral care QID Follow up Recommendations:  (TBD) Plan: MBS       GO              Ferdinand LangoLeah Sydney Hasten MA, CCC-SLP [phone]   [name] 08/29/2016, 9:22 AM

## 2016-08-29 NOTE — Progress Notes (Signed)
PT did not want his CPT

## 2016-08-29 NOTE — Progress Notes (Signed)
Progress Note    [name]  FAO:[id number] DOB: 1950/12/10  DOA: 08/22/2016  PCP: Colon Branch, MD   Brief Narrative:   Chief complaint: Follow-up pneumonia  65 y.o. male with a PMH of severe developmental delay who is nonverbal at baseline, hypertension, DVT on Xarelto, chronic diastolic CHF, and stage III chronic kidney disease who was admitted 08/22/16 for evaluation of lethargy, congestion and hypoxia. He subsequently developed septic shock in the setting of pneumococcal pneumonia and a positive rhinovirus nasal swab. He has been hypoxic and is not a candidate for BiPAP due to his heavy secretions.  Assessment/Plan:   Principal Problem:   Sepsis (HCC)/Septic shock due to pneumococcal pneumonia and rhinovirus pneumonitis - has been weaned to nasal cannula. Not a candidate for BiPAP secondary to heavy secretions and altered mental status - Continue physiotherapy vest 4 times a day and treatment with Maxipime - Weaning stress dose steroids. Seen by speech therapist and is at high risk for aspiration so he remains nothing by mouth - Continue Mucomyst. Lactate cleared and WBC normalized - Continue Maxipime through 08/29/16. Stop after today's dose - Continue bronchodilators  Active Problems:   Dysphagia - Has failed ST evaluation and remains NPO - re evaluated 11/18 and failed again, keep NPO for now     Hypernatremia - resolved with IVF - BMP in AM    DVT (deep venous thrombosis) (HCC) - On Xarelto as an outpatient. Resume Xarelto when able to take POs - currently on Heparin IV    Mental retardation/dementia with superimposed toxic/metabolic encephalopathy - SNF recommended status post PT evaluation - pt more clear this AM    Absolute anemia - No current indication for transfusion - no signs of active bleeding - CBC In AM    Acute renal failure with acute tubular necrosis superimposed on stage 3 chronic kidney disease (HCC) - Baseline creatinine appears to be  around 1.6-1.8 - Cr slowly trending down - will repeat BMP in AM    Other specified hypothyroidism - TSH 5.3, and free T4 0.81. Not on replacement therapy.    Pressure injury of skin, stage II, left buttock - Evaluated by wound care RN. Pressure reduction mattress per nursing.  - Continue soft silicone foam for insulation, protection, and management of drainage.    Hematoma right posterior thigh - Hg stable, up from 8.8 --> 9.1 - CBC in AM    Thrombocytopenia - suspect secondary to infection - resolved   Family Communication/Anticipated D/C date and plan/Code Status   DVT prophylaxis: Heparin  Code Status: DO NOT RESUSCITATE. Family Communication: No family at the bedside. Disposition Plan: SNF when medically stable. Suspect can be d/c in AM.   Medical Consultants:    None.  Procedures:    None  Anti-Infectives:   Azithromycin 11/11 > 11/12 Ceftriaxone 11/11 > 11/12 Maxipime 11/12 > 11/19 Vanc 11/12 > 11/14   Subjective:   The patient is minimally verbal, denies chest pain, dyspnea, abd pain.   Objective:    Vitals:   08/28/16 1459 08/28/16 2149 08/29/16 0500 08/29/16 0823  BP: 120/71 132/77 (!) 143/87   Pulse: 60 65 (!) 111 (!) 110  Resp: 19 20 19 20   Temp: 97.9 F (36.6 C) 98.3 F (36.8 C) 98 F (36.7 C)   TempSrc: Oral Oral    SpO2: 93% 95% 97% 98%  Weight:      Height:        Intake/Output Summary (Last 24 hours) at 08/29/16  1406 Last data filed at 08/29/16 0500  Gross per 24 hour  Intake          2244.25 ml  Output              600 ml  Net          1644.25 ml   Filed Weights   08/22/16 0550  Weight: 91.2 kg (201 lb 1 oz)    Exam: General exam: No acute distress, sleepy.  Respiratory system: Diminished but clear. Respiratory effort normal. Cardiovascular system: S1 & S2 heard, RRR. No JVD,  rubs, gallops or clicks. No murmurs. Gastrointestinal system: Abdomen is nondistended, soft and nontender. No organomegaly or masses felt.  Normal bowel sounds heard. Central nervous system: Disoriented. No focal neurological deficits. Extremities: No clubbing,  or cyanosis. 1+ edema. Skin: Large hematoma right posterior thigh. Psychiatry: Judgement and insight appear impaired. Mood & affect flat.   Data Reviewed:   I have personally reviewed following labs and imaging studies:  Labs: Basic Metabolic Panel:  Recent Labs Lab 08/24/16 0405 08/25/16 0532 08/26/16 0413 08/28/16 0429 08/29/16 0400  NA 151* 151* 150* 148* 144  K 4.9 4.7 4.7 3.8 3.6  CL 120* 116* 119* 115* 111  CO2 24 26 26 24 26   GLUCOSE 75 99 108* 95 98  BUN 57* 56* 56* 43* 31*  CREATININE 3.26* 3.03* 2.74* 1.84* 1.75*  CALCIUM 8.7* 9.1 8.9 8.7* 8.8*  MG  --  2.5*  --   --   --    Liver Function Tests:  Recent Labs Lab 08/23/16 0428 08/24/16 0405 08/26/16 0413  AST 37 63* 62*  ALT 39 37 32  ALKPHOS 67 63 75  BILITOT 0.7 0.8 0.6  PROT 6.0* 6.1* 6.3*  ALBUMIN 2.4* 2.4* 2.5*    CBC:  Recent Labs Lab 08/24/16 0405 08/26/16 0413 08/27/16 0625 08/28/16 0429 08/29/16 0400  WBC 11.1* 11.3* 9.4 12.0* 12.4*  HGB 9.4* 9.2* 9.6* 8.8* 9.1*  HCT 28.2* 28.3* 29.7* 27.3* 28.4*  MCV 83.2 85.0 86.1 84.3 84.8  PLT 65* 106* 118* 140* 224   CBG:  Recent Labs Lab 08/25/16 1305 08/25/16 1528 08/26/16 1636  GLUCAP 99 99 80   Sepsis Labs:  Recent Labs Lab 08/26/16 0413 08/27/16 0625 08/28/16 0429 08/29/16 0400  WBC 11.3* 9.4 12.0* 12.4*    Microbiology Recent Results (from the past 240 hour(s))  Urine culture     Status: None   Collection Time: 08/22/16  1:07 AM  Result Value Ref Range Status   Specimen Description URINE, RANDOM  Final   Special Requests NONE  Final   Culture NO GROWTH  Final   Report Status 08/23/2016 FINAL  Final  Blood Culture (routine x 2)     Status: None   Collection Time: 08/22/16  1:15 AM  Result Value Ref Range Status   Specimen Description BLOOD LEFT ARM  Final   Special Requests BOTTLES DRAWN  AEROBIC AND ANAEROBIC 5CC EA  Final   Culture NO GROWTH 5 DAYS  Final   Report Status 08/27/2016 FINAL  Final  Blood Culture (routine x 2)     Status: None   Collection Time: 08/22/16  1:16 AM  Result Value Ref Range Status   Specimen Description BLOOD RIGHT HAND  Final   Special Requests BOTTLES DRAWN AEROBIC AND ANAEROBIC 5CC EA  Final   Culture NO GROWTH 5 DAYS  Final   Report Status 08/27/2016 FINAL  Final  Respiratory Panel by PCR  Status: Abnormal   Collection Time: 08/22/16  6:00 AM  Result Value Ref Range Status   Adenovirus NOT DETECTED NOT DETECTED Final   Coronavirus 229E NOT DETECTED NOT DETECTED Final   Coronavirus HKU1 NOT DETECTED NOT DETECTED Final   Coronavirus NL63 NOT DETECTED NOT DETECTED Final   Coronavirus OC43 NOT DETECTED NOT DETECTED Final   Metapneumovirus NOT DETECTED NOT DETECTED Final   Rhinovirus / Enterovirus DETECTED (A) NOT DETECTED Final   Influenza A NOT DETECTED NOT DETECTED Final   Influenza B NOT DETECTED NOT DETECTED Final   Parainfluenza Virus 1 NOT DETECTED NOT DETECTED Final   Parainfluenza Virus 2 NOT DETECTED NOT DETECTED Final   Parainfluenza Virus 3 NOT DETECTED NOT DETECTED Final   Parainfluenza Virus 4 NOT DETECTED NOT DETECTED Final   Respiratory Syncytial Virus NOT DETECTED NOT DETECTED Final   Bordetella pertussis NOT DETECTED NOT DETECTED Final   Chlamydophila pneumoniae NOT DETECTED NOT DETECTED Final   Mycoplasma pneumoniae NOT DETECTED NOT DETECTED Final  MRSA PCR Screening     Status: None   Collection Time: 08/22/16  6:00 AM  Result Value Ref Range Status   MRSA by PCR NEGATIVE NEGATIVE Final    Comment:        The GeneXpert MRSA Assay (FDA approved for NASAL specimens only), is one component of a comprehensive MRSA colonization surveillance program. It is not intended to diagnose MRSA infection nor to guide or monitor treatment for MRSA infections.   Culture, respiratory (NON-Expectorated)     Status: None    Collection Time: 08/22/16  3:04 PM  Result Value Ref Range Status   Specimen Description TRACHEAL ASPIRATE  Final   Special Requests NONE  Final   Gram Stain   Final    ABUNDANT WBC PRESENT,BOTH PMN AND MONONUCLEAR MODERATE SQUAMOUS EPITHELIAL CELLS PRESENT MODERATE GRAM POSITIVE COCCI IN PAIRS FEW GRAM POSITIVE RODS RARE GRAM NEGATIVE RODS RARE GRAM NEGATIVE COCCI IN PAIRS    Culture Consistent with normal respiratory flora.  Final   Report Status 08/24/2016 FINAL  Final    Radiology: Dg Swallowing Func-speech Pathology  Result Date: 08/29/2016 Objective Swallowing Evaluation: Type of Study: MBS-Modified Barium Swallow Study Patient Details Name: [name] MRN: [id number] Date of Birth: [birth year] Today's Date: 08/29/2016 Time: SLP Start Time (ACUTE ONLY): 1015-SLP Stop Time (ACUTE ONLY): 1047 SLP Time Calculation (min) (ACUTE ONLY): 32 min Past Medical History: Past Medical History: Diagnosis Date . Dementia 06/19/2014 . DVT (deep venous thrombosis) (HCC)   right leg . Dyslipidemia  . Hypertension  . Mental retardation  . Obesity  . Other and unspecified hyperlipidemia 06/19/2014 Past Surgical History: No past surgical history on file. HPI: 65 y.o.BM PMHxSevere Mental Handicap(nonverbal at baseline), HTN, DVT on Xarelto, CKD, Mild Chronic Diastolic CHF. Who presented with cough and severe weakness. EMS found him lethargic, congested, and hypoxic at 88% on room air. In the ED he was found to be hypothermic and CXR showed atelectasis vs opacity in the right base. He has been admitted twice in the past year under similar circumstances. The first time in January he had mild AKI, and improved with fluids. The second time in March he was hypothermic but without any focal signs of infection and his TSH, cortisol, and lactate were normal as was a flu swab. He was given fluids and he returned completely to normal with just supportive care. Clinically remains in a precarious situation with  persisting hypotension and intermittent severe hypoxemia, continue conservative medical care as  per Dr. Sharon SellerMcClung discussion with his power of attorney, not a candidate for BiPAP given his heavy secretions and his inability to control them on his own. Physiotherapy vest QID.  Subjective: alert, min verbal interaction Assessment / Plan / Recommendation CHL IP CLINICAL IMPRESSIONS 08/29/2016 Therapy Diagnosis Mild pharyngeal phase dysphagia Clinical Impression Patient presents with a functional oropharyngeal swallow. Swallow initiation mildly delayed as a result of decreased coordination of breath and swallow, which with thin liquids leads to intermittent flash penetration. Oral manipulation of bolus and pharyngeal strength WFL. Although no frank penetration or aspiration noted, recommend initiation of conservative diet to maximize energy conservation and decrease aspiration risk in the setting of respiratory dysfunction as patient with frequent increase in WOB with any activity. Will f/u for tolerance at bedside as well as potential to advance diet with improved respiratory stability. Pateint should not require a f/u MBS to advance diet.   Impact on safety and function Moderate aspiration risk   CHL IP TREATMENT RECOMMENDATION 08/29/2016 Treatment Recommendations Therapy as outlined in treatment plan below   Prognosis 08/29/2016 Prognosis for Safe Diet Advancement Good Barriers to Reach Goals Cognitive deficits Barriers/Prognosis Comment -- CHL IP DIET RECOMMENDATION 08/29/2016 SLP Diet Recommendations Dysphagia 2 (Fine chop) solids;Nectar thick liquid Liquid Administration via Cup;Straw Medication Administration Whole meds with puree Compensations Slow rate;Small sips/bites Postural Changes Seated upright at 90 degrees   CHL IP OTHER RECOMMENDATIONS 08/29/2016 Recommended Consults -- Oral Care Recommendations Oral care BID Other Recommendations Order thickener from pharmacy;Prohibited food (jello, ice cream, thin  soups);Remove water pitcher   CHL IP FOLLOW UP RECOMMENDATIONS 08/29/2016 Follow up Recommendations None   CHL IP FREQUENCY AND DURATION 08/29/2016 Speech Therapy Frequency (ACUTE ONLY) min 2x/week Treatment Duration 2 weeks      CHL IP ORAL PHASE 08/29/2016 Oral Phase WFL Oral - Pudding Teaspoon -- Oral - Pudding Cup -- Oral - Honey Teaspoon -- Oral - Honey Cup -- Oral - Nectar Teaspoon -- Oral - Nectar Cup -- Oral - Nectar Straw -- Oral - Thin Teaspoon -- Oral - Thin Cup -- Oral - Thin Straw -- Oral - Puree -- Oral - Mech Soft -- Oral - Regular -- Oral - Multi-Consistency -- Oral - Pill -- Oral Phase - Comment --  CHL IP PHARYNGEAL PHASE 08/29/2016 Pharyngeal Phase Impaired Pharyngeal- Pudding Teaspoon -- Pharyngeal -- Pharyngeal- Pudding Cup -- Pharyngeal -- Pharyngeal- Honey Teaspoon -- Pharyngeal -- Pharyngeal- Honey Cup -- Pharyngeal -- Pharyngeal- Nectar Teaspoon Delayed swallow initiation-vallecula Pharyngeal -- Pharyngeal- Nectar Cup -- Pharyngeal -- Pharyngeal- Nectar Straw Delayed swallow initiation-vallecula Pharyngeal -- Pharyngeal- Thin Teaspoon Delayed swallow initiation-vallecula Pharyngeal -- Pharyngeal- Thin Cup Delayed swallow initiation-pyriform sinuses;Penetration/Aspiration before swallow Pharyngeal Material enters airway, remains ABOVE vocal cords then ejected out Pharyngeal- Thin Straw Delayed swallow initiation-pyriform sinuses;Penetration/Aspiration before swallow Pharyngeal Material enters airway, remains ABOVE vocal cords then ejected out Pharyngeal- Puree Delayed swallow initiation-vallecula Pharyngeal -- Pharyngeal- Mechanical Soft Delayed swallow initiation-vallecula Pharyngeal -- Pharyngeal- Regular -- Pharyngeal -- Pharyngeal- Multi-consistency -- Pharyngeal -- Pharyngeal- Pill -- Pharyngeal -- Pharyngeal Comment --  CHL IP CERVICAL ESOPHAGEAL PHASE 08/29/2016 Cervical Esophageal Phase WFL Pudding Teaspoon -- Pudding Cup -- Honey Teaspoon -- Honey Cup -- Nectar Teaspoon -- Nectar Cup  -- Nectar Straw -- Thin Teaspoon -- Thin Cup -- Thin Straw -- Puree -- Mechanical Soft -- Regular -- Multi-consistency -- Pill -- Cervical Esophageal Comment -- Ferdinand LangoLeah McCoy MA, CCC-SLP [phone] McCoy Leah Meryl 08/29/2016, 11:01 AM  Medications:   . acetylcysteine  2 mL Nebulization BID  . levalbuterol  0.63 mg Nebulization TID  . mouth rinse  15 mL Mouth Rinse BID   Continuous Infusions: . dextrose 1 mL (08/29/16 0703)  . heparin 1,300 Units/hr (08/29/16 0443)    LOS: 7 days   Debbora Presto  [facility] Pager [phone]  *Please refer to [email or web address], password [email or web address] to get updated schedule on who will round on this patient, as hospitalists switch teams weekly. If 7PM-7AM, please contact night-coverage at [email or web address], password [email or web address] for any overnight needs.  08/29/2016, 2:06 PM

## 2016-08-29 NOTE — Progress Notes (Signed)
Modified Barium Swallow Progress Note  Patient Details  Name: [name] MRN: [id number] Date of Birth: 09/20/1951  Today's Date: 08/29/2016  Modified Barium Swallow completed.  Full report located under Chart Review in the Imaging Section.  Brief recommendations include the following:  Clinical Impression  Patient presents with a functional oropharyngeal swallow. Swallow initiation mildly delayed as a result of decreased coordination of breath and swallow, which with thin liquids leads to intermittent flash penetration. Oral manipulation of bolus and pharyngeal strength WFL. Although no frank penetration or aspiration noted, recommend initiation of conservative diet to maximize energy conservation and decrease aspiration risk in the setting of respiratory dysfunction as patient with frequent increase in WOB with any activity. Will f/u for tolerance at bedside as well as potential to advance diet with improved respiratory stability. Pateint should not require a f/u MBS to advance diet.     Swallow Evaluation Recommendations       SLP Diet Recommendations: Dysphagia 2 (Fine chop) solids;Nectar thick liquid   Liquid Administration via: Cup;Straw   Medication Administration: Whole meds with puree   Supervision: Staff to assist with self feeding;Full supervision/cueing for compensatory strategies   Compensations: Slow rate;Small sips/bites   Postural Changes: Seated upright at 90 degrees   Oral Care Recommendations: Oral care BID   Other Recommendations: Order thickener from pharmacy;Prohibited food (jello, ice cream, thin soups);Remove water pitcher   Quintan Saldivar MA, CCC-SLP [phone]  Goldie Dimmer Meryl 08/29/2016,11:00 AM

## 2016-08-30 LAB — BASIC METABOLIC PANEL
ANION GAP: 7 (ref 5–15)
BUN: 21 mg/dL — ABNORMAL HIGH (ref 6–20)
CHLORIDE: 106 mmol/L (ref 101–111)
CO2: 26 mmol/L (ref 22–32)
CREATININE: 1.55 mg/dL — AB (ref 0.61–1.24)
Calcium: 9 mg/dL (ref 8.9–10.3)
GFR calc non Af Amer: 46 mL/min — ABNORMAL LOW (ref >60)
GFR, EST AFRICAN AMERICAN: 53 mL/min — AB (ref >60)
Glucose, Bld: 98 mg/dL (ref 65–99)
POTASSIUM: 3.6 mmol/L (ref 3.5–5.1)
SODIUM: 139 mmol/L (ref 135–145)

## 2016-08-30 LAB — CBC
HCT: 29 % — ABNORMAL LOW (ref 39.0–52.0)
HEMOGLOBIN: 9.5 g/dL — AB (ref 13.0–17.0)
MCH: 27.1 pg (ref 26.0–34.0)
MCHC: 32.8 g/dL (ref 30.0–36.0)
MCV: 82.9 fL (ref 78.0–100.0)
Platelets: 298 10*3/uL (ref 150–400)
RBC: 3.5 MIL/uL — AB (ref 4.22–5.81)
RDW: 18.3 % — ABNORMAL HIGH (ref 11.5–15.5)
WBC: 10.3 10*3/uL (ref 4.0–10.5)

## 2016-08-30 LAB — HEPARIN LEVEL (UNFRACTIONATED): HEPARIN UNFRACTIONATED: 0.41 [IU]/mL (ref 0.30–0.70)

## 2016-08-30 NOTE — Progress Notes (Signed)
LCSW following for disposition. Patient is a new placement at [facility].  LCSW has submitted all requested documents to [facility] [facility] to obtain a passar for patient. LCSW has spoken to facility liaison and made her aware of barriers at this time.   Will continue to follow. Placement bed has been obtained. Passar remains barrier requiring a Level 2.  [name] EmoryHannah Ama Mcmaster LCSW, MSW Clinical Social Work: Optician, dispensingystem Wide Float Coverage for :  [phone]

## 2016-08-30 NOTE — Progress Notes (Signed)
Progress Note    [name]  ZOX:[id number] DOB: 1951-03-13  DOA: 08/22/2016  PCP: Colon Branch, MD   Brief Narrative:   Chief complaint: Follow-up pneumonia  65 y.o. male with a PMH of severe developmental delay who is nonverbal at baseline, hypertension, DVT on Xarelto, chronic diastolic CHF, and stage III chronic kidney disease who was admitted 08/22/16 for evaluation of lethargy, congestion and hypoxia. He subsequently developed septic shock in the setting of pneumococcal pneumonia and a positive rhinovirus nasal swab. He has been hypoxic and is not a candidate for BiPAP due to his heavy secretions.  Assessment/Plan:   Principal Problem:   Sepsis (HCC)/Septic shock due to pneumococcal pneumonia and rhinovirus pneumonitis - has been weaned to nasal cannula. Not a candidate for BiPAP secondary to heavy secretions and altered mental status - Continue physiotherapy vest 4 times a day - Weaning stress dose steroids. Seen by speech therapist and is at high risk for aspiration so he remains nothing by mouth - Continue Mucomyst. Lactate cleared and WBC normalized - Continued Maxipime through 08/29/16.  - Continue bronchodilators  Active Problems:   Dysphagia - Has failed ST evaluation and remains NPO - passed swallow eval, regular diet, think liquid recommended     Hypernatremia - resolved with IVF - BMP in AM    DVT (deep venous thrombosis) (HCC) - On Xarelto as an outpatient. Resume Xarelto if able to tolerate po     Mental retardation/dementia with superimposed toxic/metabolic encephalopathy - SNF recommended status post PT evaluation - pt more clear this AM    Absolute anemia - No current indication for transfusion - no signs of active bleeding - CBC In AM    Acute renal failure with acute tubular necrosis superimposed on stage 3 chronic kidney disease (HCC) - Baseline creatinine appears to be around 1.6-1.8 - Cr slowly trending down - will repeat BMP in  AM    Other specified hypothyroidism - TSH 5.3, and free T4 0.81. Not on replacement therapy.    Pressure injury of skin, stage II, left buttock - Evaluated by wound care RN. Pressure reduction mattress per nursing.  - Continue soft silicone foam for insulation, protection, and management of drainage.    Hematoma right posterior thigh - Hg stable, up from 8.8 --> 9.1 - CBC in AM    Thrombocytopenia - suspect secondary to infection - resolved   Family Communication/Anticipated D/C date and plan/Code Status   DVT prophylaxis: Heparin  Code Status: DO NOT RESUSCITATE. Family Communication: No family at the bedside. Disposition Plan: SNF when bed available.   Medical Consultants:    None.  Procedures:    None  Anti-Infectives:   Azithromycin 11/11 > 11/12 Ceftriaxone 11/11 > 11/12 Maxipime 11/12 > 11/19 Vanc 11/12 > 11/14   Subjective:   The patient is minimally verbal, denies chest pain, dyspnea, abd pain.   Objective:    Vitals:   08/29/16 2144 08/29/16 2235 08/30/16 0450 08/30/16 0755  BP:  133/80 120/79   Pulse:  68 81   Resp:  17 16   Temp:  98.4 F (36.9 C) 98.9 F (37.2 C)   TempSrc:  Oral    SpO2: 98% 98% 99% 95%  Weight:      Height:        Intake/Output Summary (Last 24 hours) at 08/30/16 1119 Last data filed at 08/30/16 0450  Gross per 24 hour  Intake  3340 ml  Output             1250 ml  Net             2090 ml   Filed Weights   08/22/16 0550  Weight: 91.2 kg (201 lb 1 oz)    Exam: General exam: No acute distress, alert and follows commands.  Respiratory system: Respiratory effort normal. Mild rhonhci in upper lobes Cardiovascular system: S1 & S2 heard, RRR. No JVD,  rubs, gallops or clicks. No murmurs. Gastrointestinal system: Abdomen is nondistended, soft and nontender. No organomegaly or masses felt. Normal bowel sounds heard. Central nervous system: Disoriented. No focal neurological deficits. Extremities: No  clubbing,  or cyanosis. 1+ edema. Skin: Large hematoma right posterior thigh. Psychiatry: Judgement and insight appear impaired. Mood & affect flat.   Data Reviewed:   I have personally reviewed following labs and imaging studies:  Labs: Basic Metabolic Panel:  Recent Labs Lab 08/24/16 0405 08/25/16 0532 08/26/16 0413 08/28/16 0429 08/29/16 0400  NA 151* 151* 150* 148* 144  K 4.9 4.7 4.7 3.8 3.6  CL 120* 116* 119* 115* 111  CO2 24 26 26 24 26   GLUCOSE 75 99 108* 95 98  BUN 57* 56* 56* 43* 31*  CREATININE 3.26* 3.03* 2.74* 1.84* 1.75*  CALCIUM 8.7* 9.1 8.9 8.7* 8.8*  MG  --  2.5*  --   --   --    Liver Function Tests:  Recent Labs Lab 08/24/16 0405 08/26/16 0413  AST 63* 62*  ALT 37 32  ALKPHOS 63 75  BILITOT 0.8 0.6  PROT 6.1* 6.3*  ALBUMIN 2.4* 2.5*    CBC:  Recent Labs Lab 08/24/16 0405 08/26/16 0413 08/27/16 0625 08/28/16 0429 08/29/16 0400  WBC 11.1* 11.3* 9.4 12.0* 12.4*  HGB 9.4* 9.2* 9.6* 8.8* 9.1*  HCT 28.2* 28.3* 29.7* 27.3* 28.4*  MCV 83.2 85.0 86.1 84.3 84.8  PLT 65* 106* 118* 140* 224   CBG:  Recent Labs Lab 08/25/16 1305 08/25/16 1528 08/26/16 1636  GLUCAP 99 99 80   Sepsis Labs:  Recent Labs Lab 08/26/16 0413 08/27/16 0625 08/28/16 0429 08/29/16 0400  WBC 11.3* 9.4 12.0* 12.4*    Microbiology Recent Results (from the past 240 hour(s))  Urine culture     Status: None   Collection Time: 08/22/16  1:07 AM  Result Value Ref Range Status   Specimen Description URINE, RANDOM  Final   Special Requests NONE  Final   Culture NO GROWTH  Final   Report Status 08/23/2016 FINAL  Final  Blood Culture (routine x 2)     Status: None   Collection Time: 08/22/16  1:15 AM  Result Value Ref Range Status   Specimen Description BLOOD LEFT ARM  Final   Special Requests BOTTLES DRAWN AEROBIC AND ANAEROBIC 5CC EA  Final   Culture NO GROWTH 5 DAYS  Final   Report Status 08/27/2016 FINAL  Final  Blood Culture (routine x 2)     Status:  None   Collection Time: 08/22/16  1:16 AM  Result Value Ref Range Status   Specimen Description BLOOD RIGHT HAND  Final   Special Requests BOTTLES DRAWN AEROBIC AND ANAEROBIC 5CC EA  Final   Culture NO GROWTH 5 DAYS  Final   Report Status 08/27/2016 FINAL  Final  Respiratory Panel by PCR     Status: Abnormal   Collection Time: 08/22/16  6:00 AM  Result Value Ref Range Status   Adenovirus NOT DETECTED  NOT DETECTED Final   Coronavirus 229E NOT DETECTED NOT DETECTED Final   Coronavirus HKU1 NOT DETECTED NOT DETECTED Final   Coronavirus NL63 NOT DETECTED NOT DETECTED Final   Coronavirus OC43 NOT DETECTED NOT DETECTED Final   Metapneumovirus NOT DETECTED NOT DETECTED Final   Rhinovirus / Enterovirus DETECTED (A) NOT DETECTED Final   Influenza A NOT DETECTED NOT DETECTED Final   Influenza B NOT DETECTED NOT DETECTED Final   Parainfluenza Virus 1 NOT DETECTED NOT DETECTED Final   Parainfluenza Virus 2 NOT DETECTED NOT DETECTED Final   Parainfluenza Virus 3 NOT DETECTED NOT DETECTED Final   Parainfluenza Virus 4 NOT DETECTED NOT DETECTED Final   Respiratory Syncytial Virus NOT DETECTED NOT DETECTED Final   Bordetella pertussis NOT DETECTED NOT DETECTED Final   Chlamydophila pneumoniae NOT DETECTED NOT DETECTED Final   Mycoplasma pneumoniae NOT DETECTED NOT DETECTED Final  MRSA PCR Screening     Status: None   Collection Time: 08/22/16  6:00 AM  Result Value Ref Range Status   MRSA by PCR NEGATIVE NEGATIVE Final    Comment:        The GeneXpert MRSA Assay (FDA approved for NASAL specimens only), is one component of a comprehensive MRSA colonization surveillance program. It is not intended to diagnose MRSA infection nor to guide or monitor treatment for MRSA infections.   Culture, respiratory (NON-Expectorated)     Status: None   Collection Time: 08/22/16  3:04 PM  Result Value Ref Range Status   Specimen Description TRACHEAL ASPIRATE  Final   Special Requests NONE  Final   Gram  Stain   Final    ABUNDANT WBC PRESENT,BOTH PMN AND MONONUCLEAR MODERATE SQUAMOUS EPITHELIAL CELLS PRESENT MODERATE GRAM POSITIVE COCCI IN PAIRS FEW GRAM POSITIVE RODS RARE GRAM NEGATIVE RODS RARE GRAM NEGATIVE COCCI IN PAIRS    Culture Consistent with normal respiratory flora.  Final   Report Status 08/24/2016 FINAL  Final    Radiology: Dg Swallowing Func-speech Pathology  Result Date: 08/29/2016 Objective Swallowing Evaluation: Type of Study: MBS-Modified Barium Swallow Study Patient Details Name: Royston Bakernest Stang MRN: [id number] Date of Birth: 11/19/1950 Today's Date: 08/29/2016 Time: SLP Start Time (ACUTE ONLY): 1015-SLP Stop Time (ACUTE ONLY): 1047 SLP Time Calculation (min) (ACUTE ONLY): 32 min Past Medical History: Past Medical History: Diagnosis Date . Dementia 06/19/2014 . DVT (deep venous thrombosis) (HCC)   right leg . Dyslipidemia  . Hypertension  . Mental retardation  . Obesity  . Other and unspecified hyperlipidemia 06/19/2014 Past Surgical History: No past surgical history on file. HPI: 65 y.o.BM PMHxSevere Mental Handicap(nonverbal at baseline), HTN, DVT on Xarelto, CKD, Mild Chronic Diastolic CHF. Who presented with cough and severe weakness. EMS found him lethargic, congested, and hypoxic at 88% on room air. In the ED he was found to be hypothermic and CXR showed atelectasis vs opacity in the right base. He has been admitted twice in the past year under similar circumstances. The first time in January he had mild AKI, and improved with fluids. The second time in March he was hypothermic but without any focal signs of infection and his TSH, cortisol, and lactate were normal as was a flu swab. He was given fluids and he returned completely to normal with just supportive care. Clinically remains in a precarious situation with persisting hypotension and intermittent severe hypoxemia, continue conservative medical care as per Dr. Sharon SellerMcClung discussion with his power of attorney, not a  candidate for BiPAP given his heavy secretions and his inability  to control them on his own. Physiotherapy vest QID.  Subjective: alert, min verbal interaction Assessment / Plan / Recommendation CHL IP CLINICAL IMPRESSIONS 08/29/2016 Therapy Diagnosis Mild pharyngeal phase dysphagia Clinical Impression Patient presents with a functional oropharyngeal swallow. Swallow initiation mildly delayed as a result of decreased coordination of breath and swallow, which with thin liquids leads to intermittent flash penetration. Oral manipulation of bolus and pharyngeal strength WFL. Although no frank penetration or aspiration noted, recommend initiation of conservative diet to maximize energy conservation and decrease aspiration risk in the setting of respiratory dysfunction as patient with frequent increase in WOB with any activity. Will f/u for tolerance at bedside as well as potential to advance diet with improved respiratory stability. Pateint should not require a f/u MBS to advance diet.   Impact on safety and function Moderate aspiration risk   CHL IP TREATMENT RECOMMENDATION 08/29/2016 Treatment Recommendations Therapy as outlined in treatment plan below   Prognosis 08/29/2016 Prognosis for Safe Diet Advancement Good Barriers to Reach Goals Cognitive deficits Barriers/Prognosis Comment -- CHL IP DIET RECOMMENDATION 08/29/2016 SLP Diet Recommendations Dysphagia 2 (Fine chop) solids;Nectar thick liquid Liquid Administration via Cup;Straw Medication Administration Whole meds with puree Compensations Slow rate;Small sips/bites Postural Changes Seated upright at 90 degrees   CHL IP OTHER RECOMMENDATIONS 08/29/2016 Recommended Consults -- Oral Care Recommendations Oral care BID Other Recommendations Order thickener from pharmacy;Prohibited food (jello, ice cream, thin soups);Remove water pitcher   CHL IP FOLLOW UP RECOMMENDATIONS 08/29/2016 Follow up Recommendations None   CHL IP FREQUENCY AND DURATION 08/29/2016 Speech  Therapy Frequency (ACUTE ONLY) min 2x/week Treatment Duration 2 weeks      CHL IP ORAL PHASE 08/29/2016 Oral Phase WFL Oral - Pudding Teaspoon -- Oral - Pudding Cup -- Oral - Honey Teaspoon -- Oral - Honey Cup -- Oral - Nectar Teaspoon -- Oral - Nectar Cup -- Oral - Nectar Straw -- Oral - Thin Teaspoon -- Oral - Thin Cup -- Oral - Thin Straw -- Oral - Puree -- Oral - Mech Soft -- Oral - Regular -- Oral - Multi-Consistency -- Oral - Pill -- Oral Phase - Comment --  CHL IP PHARYNGEAL PHASE 08/29/2016 Pharyngeal Phase Impaired Pharyngeal- Pudding Teaspoon -- Pharyngeal -- Pharyngeal- Pudding Cup -- Pharyngeal -- Pharyngeal- Honey Teaspoon -- Pharyngeal -- Pharyngeal- Honey Cup -- Pharyngeal -- Pharyngeal- Nectar Teaspoon Delayed swallow initiation-vallecula Pharyngeal -- Pharyngeal- Nectar Cup -- Pharyngeal -- Pharyngeal- Nectar Straw Delayed swallow initiation-vallecula Pharyngeal -- Pharyngeal- Thin Teaspoon Delayed swallow initiation-vallecula Pharyngeal -- Pharyngeal- Thin Cup Delayed swallow initiation-pyriform sinuses;Penetration/Aspiration before swallow Pharyngeal Material enters airway, remains ABOVE vocal cords then ejected out Pharyngeal- Thin Straw Delayed swallow initiation-pyriform sinuses;Penetration/Aspiration before swallow Pharyngeal Material enters airway, remains ABOVE vocal cords then ejected out Pharyngeal- Puree Delayed swallow initiation-vallecula Pharyngeal -- Pharyngeal- Mechanical Soft Delayed swallow initiation-vallecula Pharyngeal -- Pharyngeal- Regular -- Pharyngeal -- Pharyngeal- Multi-consistency -- Pharyngeal -- Pharyngeal- Pill -- Pharyngeal -- Pharyngeal Comment --  CHL IP CERVICAL ESOPHAGEAL PHASE 08/29/2016 Cervical Esophageal Phase WFL Pudding Teaspoon -- Pudding Cup -- Honey Teaspoon -- Honey Cup -- Nectar Teaspoon -- Nectar Cup -- Nectar Straw -- Thin Teaspoon -- Thin Cup -- Thin Straw -- Puree -- Mechanical Soft -- Regular -- Multi-consistency -- Pill -- Cervical Esophageal  Comment -- Ferdinand Lango MA, CCC-SLP [phone] McCoy Leah Meryl 08/29/2016, 11:01 AM               Medications:   . acetylcysteine  2 mL Nebulization BID  . levalbuterol  0.63 mg  Nebulization TID  . mouth rinse  15 mL Mouth Rinse BID   Continuous Infusions: . dextrose 125 mL/hr at 08/30/16 0739  . heparin 1,300 Units/hr (08/29/16 2136)    LOS: 8 days   Debbora Presto  [facility] Pager [phone]  *Please refer to [email or web address], password [email or web address] to get updated schedule on who will round on this patient, as hospitalists switch teams weekly. If 7PM-7AM, please contact night-coverage at [email or web address], password [email or web address] for any overnight needs.  08/30/2016, 11:19 AM

## 2016-08-30 NOTE — Progress Notes (Signed)
RT entered room to perform CPT as scheduled. RT attempted to place tubing in vest to inflate and patient became combative screaming no. CPT not given at this time. RN aware.

## 2016-08-30 NOTE — Progress Notes (Signed)
Physical Therapy Treatment Patient Details Name: [name] MRN: [id number] DOB: 04/13/1951 Today's Date: 08/30/2016    History of Present Illness 65 y.o. 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  EMS found him lethargic, congested, and hypoxic at 88% on room air.  In the ED he was found to be hypothermic and CXR showed atelectasis vs opacity in the right base    PT Comments    Patient is progressing toward mobility goals. Continue to progress as tolerated with anticipated d/c to SNF for further skilled PT services.    Follow Up Recommendations  SNF     Equipment Recommendations  None recommended by PT    Recommendations for Other Services       Precautions / Restrictions Precautions Precautions: Fall Restrictions Weight Bearing Restrictions: No    Mobility  Bed Mobility Overal bed mobility: Needs Assistance Bed Mobility: Supine to Sit     Supine to sit: Mod assist;+2 for physical assistance;HOB elevated     General bed mobility comments: cues for sequencing and technique with pt following commands and able to elevate trunk most of the way wihtout assist; assist to bring bilat LE to EOB and use of bed pad to position  Transfers Overall transfer level: Needs assistance   Transfers: Sit to/from Stand Sit to Stand: Max assist;+2 physical assistance;From elevated surface         General transfer comment: cues for use of Stedy and pt able to pull through bilat UE however required +2 assist to lift hips and achieve standing; max cues for posture and forward gaze; pt maintianed cervical flexion throughtou transfer  Ambulation/Gait                 Stairs            Wheelchair Mobility    Modified Rankin (Stroke Patients Only)       Balance     Sitting balance-Leahy Scale: Fair Sitting balance - Comments: grossly min guard for  sitting EOB                            Cognition Arousal/Alertness: Awake/alert Behavior During Therapy: Flat affect Overall Cognitive Status: Difficult to assess                      Exercises      General Comments        Pertinent Vitals/Pain Pain Assessment: No/denies pain    Home Living                      Prior Function            PT Goals (current goals can now be found in the care plan section) Acute Rehab PT Goals Patient Stated Goal: Unable to state Progress towards PT goals: Progressing toward goals    Frequency    Min 2X/week      PT Plan Current plan remains appropriate    Co-evaluation             End of Session Equipment Utilized During Treatment: Gait belt;Oxygen Activity Tolerance: Patient tolerated treatment well Patient left: in chair;with call bell/phone within reach;with chair alarm set     Time: 1430-1511 PT Time Calculation (min) (ACUTE ONLY): 41 min  Charges:  $Therapeutic Activity: 38-52 mins  G Codes:      Derek MoundKellyn R Melora Menon Kennette Cuthrell, PTA Pager: [phone]   08/30/2016, 4:47 PM

## 2016-08-30 NOTE — Progress Notes (Signed)
ANTICOAGULATION CONSULT NOTE  Pharmacy Consult for Heparin Indication: Hx DVT  No Known Allergies  Patient Measurements: Height: 6' (182.9 cm) Weight: 201 lb 1 oz (91.2 kg) IBW/kg (Calculated) : 77.6 Heparin Dosing Weight: 91  Vital Signs: Temp: 98.9 F (37.2 C) (11/20 0450) BP: 120/79 (11/20 0450) Pulse Rate: 81 (11/20 0450)  Labs:  Recent Labs  08/27/16 1840  08/28/16 0429 08/29/16 0400 08/30/16 1219  HGB  --   < > 8.8* 9.1* 9.5*  HCT  --   --  27.3* 28.4* 29.0*  PLT  --   --  140* 224 298  APTT 62*  --  79*  --   --   HEPARINUNFRC 0.66  --  0.70 0.56 0.41  CREATININE  --   --  1.84* 1.75* 1.55*  < > = values in this interval not displayed.  Estimated Creatinine Clearance: 53.5 mL/min (by C-G formula based on SCr of 1.55 mg/dL (H)).   Assessment: 65 yo male with hx DVT on Xarelto pta, last dose on 11/11.  He was bridged with Lovenox and then transitioned to heparin given AKI and some hemoptysis. Last dose lovenox 11/16. Hgb 9.5, plts wnl. RD unable to place Cortrak TF and pt failed swallow eval so unable to resume home DOAC.   Heparin level therapeutic, shooting for more conservative heparin level goal of 0.3-0.5 given recent hemoptysis and AKI.   Goal of Therapy:  Heparin level 0.3-0.5 units/ml aPTT 66-85 seconds Monitor platelets by anticoagulation protocol: Yes   Plan:  Continue heparin at 1300 units/hr Daily HL, CBC Watch for s/sx of bleeding If unable to transition to PO DOAC consider back to LMWH for long term tx    Agapito GamesAlison Daylynn Stumpp, PharmD, BCPS Clinical Pharmacist 08/30/2016 1:42 PM

## 2016-08-30 NOTE — Progress Notes (Signed)
Speech Language Pathology Treatment: Dysphagia  Patient Details Name: [name] MRN: [id number] DOB: 03/07/1951 Today's Date: 08/30/2016 Time: [id number] SLP Time Calculation (min) (ACUTE ONLY): 18 min  Assessment / Plan / Recommendation Clinical Impression  Pt seen for f/u after yesterday's MBS showing normal swallow function. Pt was placed on restricted diet for energy conservation and to decreased risk in setting of shortness of breath and fatigue with minimal activity. Today pt seen with full breakfast tray at bedside that he refused to eat. SLP repositioned pt, which did result in increased RR and coughing, mobilizing sputum. Despite this, pt was able to masticate regular solids and thin liquids in small amounts without struggle. Thin liquids were taken via straw consecutively with no signs of aspiration. He appears to consume  small snacks/bites of meals at a time, perhaps to compensate for fatigue. Recommend pt upgrade to regular solids and thin liquids to promote intake given his refusal of modified textures. Pt to have full supervision for positioning, aspiration precautions and assistance with feeding as needed. Will f/u for tolerance with a meal.    HPI HPI: 65 y.o.BM PMHxSevere Mental Handicap(nonverbal at baseline), HTN, DVT on Xarelto, CKD, Mild Chronic Diastolic CHF. Who presented with cough and severe weakness. EMS found him lethargic, congested, and hypoxic at 88% on room air. In the ED he was found to be hypothermic and CXR showed atelectasis vs opacity in the right base. He has been admitted twice in the past year under similar circumstances. The first time in January he had mild AKI, and improved with fluids. The second time in March he was hypothermic but without any focal signs of infection and his TSH, cortisol, and lactate were normal as was a flu swab. He was given fluids and he returned completely to normal with just supportive care. Clinically remains in a precarious  situation with persisting hypotension and intermittent severe hypoxemia, continue conservative medical care as per Dr. Sharon SellerMcClung discussion with his power of attorney, not a candidate for BiPAP given his heavy secretions and his inability to control them on his own. Physiotherapy vest QID.       SLP Plan  Continue with current plan of care     Recommendations  Diet recommendations: Regular;Thin liquid Liquids provided via: Cup;Straw Medication Administration: Whole meds with liquid Supervision: Full supervision/cueing for compensatory strategies Compensations: Minimize environmental distractions;Slow rate;Small sips/bites Postural Changes and/or Swallow Maneuvers: Seated upright 90 degrees;Upright 30-60 min after meal                Oral Care Recommendations: Oral care BID Follow up Recommendations: 24 hour supervision/assistance Plan: Continue with current plan of care       GO               [facility] Surgery CenterBonnie Tino Ronan, MA CCC-SLP [phone]  Claudine MoutonDeBlois, Taryne Kiger Caroline 08/30/2016, 10:46 AM

## 2016-08-31 LAB — HEPARIN LEVEL (UNFRACTIONATED): Heparin Unfractionated: 0.38 IU/mL (ref 0.30–0.70)

## 2016-08-31 LAB — CBC
HCT: 29.6 % — ABNORMAL LOW (ref 39.0–52.0)
Hemoglobin: 9.9 g/dL — ABNORMAL LOW (ref 13.0–17.0)
MCH: 27.5 pg (ref 26.0–34.0)
MCHC: 33.4 g/dL (ref 30.0–36.0)
MCV: 82.2 fL (ref 78.0–100.0)
PLATELETS: 360 10*3/uL (ref 150–400)
RBC: 3.6 MIL/uL — ABNORMAL LOW (ref 4.22–5.81)
RDW: 17.8 % — AB (ref 11.5–15.5)
WBC: 12.2 10*3/uL — ABNORMAL HIGH (ref 4.0–10.5)

## 2016-08-31 MED ORDER — RIVAROXABAN 20 MG PO TABS
20.0000 mg | ORAL_TABLET | Freq: Every day | ORAL | Status: DC
  Administered 2016-08-31 – 2016-09-07 (×8): 20 mg via ORAL
  Filled 2016-08-31 (×8): qty 1

## 2016-08-31 MED ORDER — FUROSEMIDE 20 MG PO TABS
20.0000 mg | ORAL_TABLET | Freq: Two times a day (BID) | ORAL | Status: DC
  Administered 2016-08-31 – 2016-09-07 (×15): 20 mg via ORAL
  Filled 2016-08-31 (×15): qty 1

## 2016-08-31 MED ORDER — FERROUS SULFATE 325 (65 FE) MG PO TABS
325.0000 mg | ORAL_TABLET | Freq: Every day | ORAL | Status: DC
  Administered 2016-09-01 – 2016-09-07 (×7): 325 mg via ORAL
  Filled 2016-08-31 (×7): qty 1

## 2016-08-31 MED ORDER — ARIPIPRAZOLE 10 MG PO TABS
30.0000 mg | ORAL_TABLET | Freq: Every day | ORAL | Status: DC
  Administered 2016-08-31 – 2016-09-07 (×8): 30 mg via ORAL
  Filled 2016-08-31 (×8): qty 3

## 2016-08-31 NOTE — Progress Notes (Signed)
RT attempted to do Neb tx and chest vest. Patient refused and hit RT.

## 2016-08-31 NOTE — Consult Note (Addendum)
WOC Nurse wound follow up Wound type: deep tissue injury sacrum Status unchanged  Measurement: 10cm x 11cm x 0.1cm  Wound bed: partial thickness skin loss, pink, moist Drainage (amount, consistency, odor) moderate, but hard to assess today, patient's condom catheter was leaking and dressing appears to be soaked with urine Periwound: intact  Dressing procedure/placement/frequency: Continue silicone foam dressing Low air loss mattress in place for pressure redistribution and moisture management.  WOC Nurse team will follow along with you for weekly wound assessments.  Please notify me of any acute changes in the wounds or any new areas of concerns Yonas Bunda [facility] Center[facility] MSN, RN,CWOCN, CNS [phone]

## 2016-08-31 NOTE — Progress Notes (Signed)
Occupational Therapy Treatment Patient Details Name: [name] MRN: [id number] DOB: 10/24/1950 Today's Date: 08/31/2016    History of present illness 65 y.o. 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  EMS found him lethargic, congested, and hypoxic at 88% on room air.  In the ED he was found to be hypothermic and CXR showed atelectasis vs opacity in the right base   OT comments  Limited participation today. Total A with attempting bed mobility. Total A with simple grooming. Attempted to engage in feeding however pt would say "no". Will continue to follow and readdress goals next session.   Follow Up Recommendations  SNF    Equipment Recommendations  None recommended by OT    Recommendations for Other Services      Precautions / Restrictions Precautions Precautions: Fall Precaution Comments: contractures/skin breakdown       Mobility Bed Mobility               General bed mobility comments: unable to initiate bed mobility. Attmepted to roll - requiring total A. holding knees tightly in flexion  Transfers                      Balance                                   ADL       Grooming: Wash/dry face;Wash/dry hands   Upper Body Bathing: Total assistance                             General ADL Comments: briefly followed command to wash hands after cloth placed in hand. attempted to work on self feeding with pt upright in bed however pt continued to resist and say "no"      Vision  eyes open. Unsure of visual status                   Perception     Praxis      Cognition   Behavior During Therapy: Flat affect Overall Cognitive Status: Difficult to assess                       Extremity/Trunk Assessment               Exercises     Shoulder Instructions       General Comments       Pertinent Vitals/ Pain       Pain Assessment: Faces Faces Pain Scale: Hurts little more Pain Location: With BUE AA/PROM and B knee extension Pain Descriptors / Indicators: Grimacing Pain Intervention(s): Limited activity within patient's tolerance  Home Living                                          Prior Functioning/Environment              Frequency  Min 2X/week        Progress Toward Goals  OT Goals(current goals can now be found in the care plan section)  Progress towards OT goals: Progressing toward goals  Acute Rehab OT Goals Patient Stated Goal: Unable to state OT Goal  Formulation: Patient unable to participate in goal setting Time For Goal Achievement: 09/09/16 Potential to Achieve Goals: Fair ADL Goals Pt Will Perform Grooming: with mod assist;sitting Pt Will Perform Upper Body Bathing: with mod assist;sitting Pt Will Transfer to Toilet: with mod assist;stand pivot transfer;squat pivot transfer;bedside commode  Plan Discharge plan remains appropriate    Co-evaluation                 End of Session     Activity Tolerance Patient limited by lethargy;Patient limited by fatigue   Patient Left in bed;with call bell/phone within reach;with bed alarm set   Nurse Communication Mobility status        Time: [id number] OT Time Calculation (min): 12 min  Charges: OT General Charges $OT Visit: 1 Procedure OT Treatments $Self Care/Home Management : 8-22 mins  Khelani Kops,HILLARY 08/31/2016, 4:14 PM   [facility] Zayvion Stailey, OTR/L  [phone] 08/31/2016

## 2016-08-31 NOTE — Progress Notes (Addendum)
Progress Note    Jance Siek  ZOX:[id number] DOB: 11-29-1950  DOA: 08/22/2016  PCP: Colon Branch, MD   Brief Narrative:   Chief complaint: Follow-up pneumonia  65 y.o. male with a PMH of severe developmental delay who is nonverbal at baseline, hypertension, DVT on Xarelto, chronic diastolic CHF, and stage III chronic kidney disease who was admitted 08/22/16 for evaluation of lethargy, congestion and hypoxia. He subsequently developed septic shock in the setting of pneumococcal pneumonia and a positive rhinovirus nasal swab. He has been hypoxic and is not a candidate for BiPAP due to his heavy secretions.  Hospital course complicated by inability to pass swallow test but eventually did pass on 11/20, diet was advanced and pt's medication changed from Heparin to Xarelto. PT eval done and determined that pt needs to be discharged to SNF for continuation of physical therapy but since patient is a new placement at [facility], passar to be obtained prior to discharge per LCSQ.   Assessment/Plan:   Principal Problem:   Sepsis (HCC)/Septic shock due to pneumococcal pneumonia and rhinovirus pneumonitis - has been weaned to nasal cannula. Not a candidate for BiPAP secondary to heavy secretions and altered mental status - Continue physiotherapy vest 4 times a day - Weaned off stress dose steroids. Seen by speech therapist and is at high risk for aspiration - Continue Mucomyst.  - Lactate cleared and WBC normalized in the past 72 hours but up this AM, unclear why as no signs of an infectious etiology noted.  - Continued Maxipime through 08/29/16.  - Continue bronchodilators  Active Problems:   Dysphagia - Has failed ST evaluation and remains NPO - passed swallow eval, regular diet    Hypernatremia - resolved with IVF - BMP in AM    DVT (deep venous thrombosis) (HCC) - On Xarelto as an outpatient. Resume Xarelto per pharmacy today 11/21    Mental  retardation/dementia with superimposed toxic/metabolic encephalopathy - SNF recommended status post PT evaluation - pt more clear this AM, able to follow some commands  - resume Abilify     Chronic diastolic CHF - last ECHO in 12/2015 with grade I diastolic CHF - stopped IVF as pt now able to take PO - resume home regimen with lasix     Absolute anemia - No current indication for transfusion - no signs of active bleeding - CBC In AM    Acute renal failure with acute tubular necrosis superimposed on stage 3 chronic kidney disease (HCC) - Baseline creatinine appears to be around 1.6-1.8 - Cr slowly trending down, now ~ 1.5 - will repeat BMP in AM    Other specified hypothyroidism - TSH 5.3, and free T4 0.81. Not on replacement therapy.    Pressure injury of skin, stage II, left buttock - Evaluated by wound care RN. Pressure reduction mattress per nursing.  - Continue soft silicone foam for insulation, protection, and management of drainage.    Hematoma right posterior thigh - Hg stable, up from 8.8 --> 9.1 --> 9.5 - no signs of active bleeding  - CBC in AM    Thrombocytopenia - suspect secondary to infection - resolved   Family Communication/Anticipated D/C date and plan/Code Status   DVT prophylaxis: Heparin --> transition to oral Xarelto today 11/21 per pharmacy  Code Status: DO NOT RESUSCITATE. Family Communication: No family at the bedside. Disposition Plan: SNF when bed available.   Medical Consultants:    None.  Procedures:  None  Anti-Infectives:   Azithromycin 11/11 > 11/12 Ceftriaxone 11/11 > 11/12 Maxipime 11/12 > 11/19 Vanc 11/12 > 11/14   Subjective:   The patient is minimally verbal, denies chest pain, dyspnea, abd pain.   Objective:    Vitals:   08/30/16 2106 08/30/16 2114 08/31/16 0448 08/31/16 0806  BP:  127/90 131/89   Pulse:  (!) 107 (!) 108   Resp:  16 16   Temp:  98.5 F (36.9 C) 98.2 F (36.8 C)   TempSrc:  Oral Oral     SpO2: 97% 100% 100% 93%  Weight:      Height:        Intake/Output Summary (Last 24 hours) at 08/31/16 1241 Last data filed at 08/31/16 1006  Gross per 24 hour  Intake              410 ml  Output             1200 ml  Net             -790 ml   Filed Weights   08/22/16 0550  Weight: 91.2 kg (201 lb 1 oz)    Exam: General exam: No acute distress, alert and follows commands.  Respiratory system: Respiratory effort normal. Mild rhonhci bibasilar  Cardiovascular system: S1 & S2 heard, RRR. No JVD,  rubs, gallops or clicks. No murmurs. Gastrointestinal system: Abdomen is nondistended, soft and nontender. No organomegaly or masses felt. Normal bowel sounds heard. Central nervous system: Disoriented. No focal neurological deficits. Extremities: No clubbing,  or cyanosis. 1+ edema. Skin: Large hematoma right posterior thigh. Psychiatry: Mood & affect flat.   Data Reviewed:   I have personally reviewed following labs and imaging studies:  Labs: Basic Metabolic Panel:  Recent Labs Lab 08/25/16 0532 08/26/16 0413 08/28/16 0429 08/29/16 0400 08/30/16 1219  NA 151* 150* 148* 144 139  K 4.7 4.7 3.8 3.6 3.6  CL 116* 119* 115* 111 106  CO2 26 26 24 26 26   GLUCOSE 99 108* 95 98 98  BUN 56* 56* 43* 31* 21*  CREATININE 3.03* 2.74* 1.84* 1.75* 1.55*  CALCIUM 9.1 8.9 8.7* 8.8* 9.0  MG 2.5*  --   --   --   --    Liver Function Tests:  Recent Labs Lab 08/26/16 0413  AST 62*  ALT 32  ALKPHOS 75  BILITOT 0.6  PROT 6.3*  ALBUMIN 2.5*    CBC:  Recent Labs Lab 08/27/16 0625 08/28/16 0429 08/29/16 0400 08/30/16 1219 08/31/16 0323  WBC 9.4 12.0* 12.4* 10.3 12.2*  HGB 9.6* 8.8* 9.1* 9.5* 9.9*  HCT 29.7* 27.3* 28.4* 29.0* 29.6*  MCV 86.1 84.3 84.8 82.9 82.2  PLT 118* 140* 224 298 360   CBG:  Recent Labs Lab 08/25/16 1305 08/25/16 1528 08/26/16 1636  GLUCAP 99 99 80   Sepsis Labs:  Recent Labs Lab 08/28/16 0429 08/29/16 0400 08/30/16 1219 08/31/16 0323   WBC 12.0* 12.4* 10.3 12.2*    Microbiology Recent Results (from the past 240 hour(s))  Urine culture     Status: None   Collection Time: 08/22/16  1:07 AM  Result Value Ref Range Status   Specimen Description URINE, RANDOM  Final   Special Requests NONE  Final   Culture NO GROWTH  Final   Report Status 08/23/2016 FINAL  Final  Blood Culture (routine x 2)     Status: None   Collection Time: 08/22/16  1:15 AM  Result Value Ref Range Status  Specimen Description BLOOD LEFT ARM  Final   Special Requests BOTTLES DRAWN AEROBIC AND ANAEROBIC 5CC EA  Final   Culture NO GROWTH 5 DAYS  Final   Report Status 08/27/2016 FINAL  Final  Blood Culture (routine x 2)     Status: None   Collection Time: 08/22/16  1:16 AM  Result Value Ref Range Status   Specimen Description BLOOD RIGHT HAND  Final   Special Requests BOTTLES DRAWN AEROBIC AND ANAEROBIC 5CC EA  Final   Culture NO GROWTH 5 DAYS  Final   Report Status 08/27/2016 FINAL  Final  Respiratory Panel by PCR     Status: Abnormal   Collection Time: 08/22/16  6:00 AM  Result Value Ref Range Status   Adenovirus NOT DETECTED NOT DETECTED Final   Coronavirus 229E NOT DETECTED NOT DETECTED Final   Coronavirus HKU1 NOT DETECTED NOT DETECTED Final   Coronavirus NL63 NOT DETECTED NOT DETECTED Final   Coronavirus OC43 NOT DETECTED NOT DETECTED Final   Metapneumovirus NOT DETECTED NOT DETECTED Final   Rhinovirus / Enterovirus DETECTED (A) NOT DETECTED Final   Influenza A NOT DETECTED NOT DETECTED Final   Influenza B NOT DETECTED NOT DETECTED Final   Parainfluenza Virus 1 NOT DETECTED NOT DETECTED Final   Parainfluenza Virus 2 NOT DETECTED NOT DETECTED Final   Parainfluenza Virus 3 NOT DETECTED NOT DETECTED Final   Parainfluenza Virus 4 NOT DETECTED NOT DETECTED Final   Respiratory Syncytial Virus NOT DETECTED NOT DETECTED Final   Bordetella pertussis NOT DETECTED NOT DETECTED Final   Chlamydophila pneumoniae NOT DETECTED NOT DETECTED Final    Mycoplasma pneumoniae NOT DETECTED NOT DETECTED Final  MRSA PCR Screening     Status: None   Collection Time: 08/22/16  6:00 AM  Result Value Ref Range Status   MRSA by PCR NEGATIVE NEGATIVE Final    Comment:        The GeneXpert MRSA Assay (FDA approved for NASAL specimens only), is one component of a comprehensive MRSA colonization surveillance program. It is not intended to diagnose MRSA infection nor to guide or monitor treatment for MRSA infections.   Culture, respiratory (NON-Expectorated)     Status: None   Collection Time: 08/22/16  3:04 PM  Result Value Ref Range Status   Specimen Description TRACHEAL ASPIRATE  Final   Special Requests NONE  Final   Gram Stain   Final    ABUNDANT WBC PRESENT,BOTH PMN AND MONONUCLEAR MODERATE SQUAMOUS EPITHELIAL CELLS PRESENT MODERATE GRAM POSITIVE COCCI IN PAIRS FEW GRAM POSITIVE RODS RARE GRAM NEGATIVE RODS RARE GRAM NEGATIVE COCCI IN PAIRS    Culture Consistent with normal respiratory flora.  Final   Report Status 08/24/2016 FINAL  Final    Radiology: No results found.  Medications:   . acetylcysteine  2 mL Nebulization BID  . levalbuterol  0.63 mg Nebulization TID  . mouth rinse  15 mL Mouth Rinse BID   Continuous Infusions: . dextrose 125 mL/hr at 08/31/16 1055  . heparin 1,300 Units/hr (08/29/16 2136)    LOS: 9 days   Debbora PrestoMAGICK-Lemarcus Baggerly  [facility] Pager [phone]  *Please refer to [email or web address], password [email or web address] to get updated schedule on who will round on this patient, as hospitalists switch teams weekly. If 7PM-7AM, please contact night-coverage at [email or web address], password [email or web address] for any overnight needs.  08/31/2016, 12:41 PM

## 2016-08-31 NOTE — Progress Notes (Signed)
Speech Language Pathology Treatment: Dysphagia  Patient Details Name: [name] MRN: [id number] DOB: Dec 04, 1950 Today's Date: 08/31/2016 Time: 4944-9675 SLP Time Calculation (min) (ACUTE ONLY): 16 min  Assessment / Plan / Recommendation Clinical Impression  Pt seen with am meal, eager to eat, but only consumed about 50% of meal. Pt with congested breathing and frequent soft coughing prior to initiating meal, which continued during meal, but did not worsen or seem indicative of coughing related to PO intake. Mastication was lacking rotary motion, lateral lingual movement for bolus manipulation limited. Pt was able to tolerate soft solids, but would not recommend certain regular textures, such as grapes or larger meats. Will downgrade to dys 3 (mech soft) with thin liquids. Pt to continue full supervision, assist as needed, clean mouth after meals to reduce risk of aspiration of oral bacteria and particulate matter in between meals. No further SLP intervention needed.    HPI HPI: 65 y.o.BM PMHxSevere Mental Handicap(nonverbal at baseline), HTN, DVT on Xarelto, CKD, Mild Chronic Diastolic CHF. Who presented with cough and severe weakness. EMS found him lethargic, congested, and hypoxic at 88% on room air. In the ED he was found to be hypothermic and CXR showed atelectasis vs opacity in the right base. He has been admitted twice in the past year under similar circumstances. The first time in January he had mild AKI, and improved with fluids. The second time in March he was hypothermic but without any focal signs of infection and his TSH, cortisol, and lactate were normal as was a flu swab. He was given fluids and he returned completely to normal with just supportive care. Clinically remains in a precarious situation with persisting hypotension and intermittent severe hypoxemia, continue conservative medical care as per Dr. Thereasa Solo discussion with his power of attorney, not a candidate for BiPAP  given his heavy secretions and his inability to control them on his own. Physiotherapy vest QID.       SLP Plan  All goals met     Recommendations  Diet recommendations: Dysphagia 3 (mechanical soft);Thin liquid Liquids provided via: Cup;Straw Medication Administration: Crushed with puree Supervision: Staff to assist with self feeding;Full supervision/cueing for compensatory strategies Compensations: Minimize environmental distractions;Slow rate;Small sips/bites;Lingual sweep for clearance of pocketing Postural Changes and/or Swallow Maneuvers: Seated upright 90 degrees;Upright 30-60 min after meal                Plan: All goals met       GO               [name] Baltimore, MA CCC-SLP [phone]  [name] 08/31/2016, 11:14 AM

## 2016-08-31 NOTE — Progress Notes (Signed)
ANTICOAGULATION CONSULT NOTE - Follow Up Consult  Pharmacy Consult for Heparin Indication: hx DVT  No Known Allergies  Patient Measurements: Height: 6' (182.9 cm) Weight: 201 lb 1 oz (91.2 kg) IBW/kg (Calculated) : 77.6  Vital Signs: Temp: 98.2 F (36.8 C) (11/21 0448) Temp Source: Oral (11/21 0448) BP: 131/89 (11/21 0448) Pulse Rate: 108 (11/21 0448)  Labs:  Recent Labs  08/29/16 0400 08/30/16 1219 08/31/16 0323  HGB 9.1* 9.5* 9.9*  HCT 28.4* 29.0* 29.6*  PLT 224 298 360  HEPARINUNFRC 0.56 0.41 0.38  CREATININE 1.75* 1.55*  --     Estimated Creatinine Clearance: 53.5 mL/min (by C-G formula based on SCr of 1.55 mg/dL (H)).  Assessment: 65yo male on heparin bridge while off Xarelto.  Pt has now passed swallow eval with plan to resume Xarelto if tolerating po.  Heparin level goal adjusted down for some hemoptysis earlier, none further reported and level within goal range.  Hg is stable and pltc is trending up.    Goal of Therapy:  Heparin level 0.3-0.5 units/ml Monitor platelets by anticoagulation protocol: Yes   Plan:  Continue heaprin at current rate F/U plans to resume Xarelto Watch for s/s of bleeding  Lisseth Brazeau P 08/31/2016,10:24 AM

## 2016-09-01 DIAGNOSIS — F0150 Vascular dementia without behavioral disturbance: Secondary | ICD-10-CM

## 2016-09-01 DIAGNOSIS — J181 Lobar pneumonia, unspecified organism: Secondary | ICD-10-CM

## 2016-09-01 LAB — CBC
HCT: 26.5 % — ABNORMAL LOW (ref 39.0–52.0)
Hemoglobin: 8.8 g/dL — ABNORMAL LOW (ref 13.0–17.0)
MCH: 27.7 pg (ref 26.0–34.0)
MCHC: 33.2 g/dL (ref 30.0–36.0)
MCV: 83.3 fL (ref 78.0–100.0)
PLATELETS: 363 10*3/uL (ref 150–400)
RBC: 3.18 MIL/uL — ABNORMAL LOW (ref 4.22–5.81)
RDW: 17.6 % — AB (ref 11.5–15.5)
WBC: 12.9 10*3/uL — ABNORMAL HIGH (ref 4.0–10.5)

## 2016-09-01 LAB — BASIC METABOLIC PANEL
Anion gap: 7 (ref 5–15)
BUN: 19 mg/dL (ref 6–20)
CALCIUM: 8.8 mg/dL — AB (ref 8.9–10.3)
CO2: 25 mmol/L (ref 22–32)
CREATININE: 1.71 mg/dL — AB (ref 0.61–1.24)
Chloride: 107 mmol/L (ref 101–111)
GFR calc non Af Amer: 41 mL/min — ABNORMAL LOW (ref >60)
GFR, EST AFRICAN AMERICAN: 47 mL/min — AB (ref >60)
Glucose, Bld: 83 mg/dL (ref 65–99)
Potassium: 3.8 mmol/L (ref 3.5–5.1)
SODIUM: 139 mmol/L (ref 135–145)

## 2016-09-01 MED ORDER — NEPRO/CARBSTEADY PO LIQD
237.0000 mL | ORAL | Status: DC
  Administered 2016-09-01 – 2016-09-07 (×6): 237 mL via ORAL
  Filled 2016-09-01 (×8): qty 237

## 2016-09-01 NOTE — Progress Notes (Signed)
Nutrition Follow-up  DOCUMENTATION CODES:   Not applicable  INTERVENTION:   -Nepro Shake po daily, each supplement provides 425 kcal and 19 grams protein -Recommend initiation of bowel regimen, pt with no BM since 08/26/16 per doc flowsheets   NUTRITION DIAGNOSIS:   Inadequate oral intake related to lethargy/confusion as evidenced by  (variable po intake).  Progressing  GOAL:   Patient will meet greater than or equal to 90% of their needs  Progressing  MONITOR:   PO intake, Supplement acceptance, Diet advancement, Labs, Weight trends, Skin, I & O's  REASON FOR ASSESSMENT:   Malnutrition Screening Tool, Low Braden    ASSESSMENT:   65 y.o. male with a past medical history significant for severe MR, HTN, DVT on Xarelto, CKD and HFpEF who presents with worsening cough for one week and weakness.  Pt sleeping soundly at time of visit. RD did not disturb. No caregivers available to obtain further hx.   Pt transferred from SDU to surgical floor in 08/28/16. Noted that pt refused cortrak tube placement on 08/28/17. Pt underwent MBSS on 08/29/16 and was advanced to a dysphagia 2 diet with honey thick liquids. Repeat BSE on 08/30/16 advanced diet to regular consistency diet (pt currently on dysphagia 3 diet with thin liquids). Per doc flowsheets, noted increased oral intake with diet advancement, however, PO intake remains variable at this time (PO: 5-100%). RD will add supplements to assist with wound healing and to optimize oral intake.   Reviewed CWOCN note from 08/31/16; pt with DPTI on sacrum.   Labs reviewed.  Diet Order:  DIET DYS 3 Room service appropriate? Yes; Fluid consistency: Thin  Skin:  Wound (see comment) (DTPI sacrum)  Last BM:  08/26/16  Height:   Ht Readings from Last 1 Encounters:  08/22/16 6' (1.829 m)    Weight:   Wt Readings from Last 1 Encounters:  08/22/16 201 lb 1 oz (91.2 kg)    Ideal Body Weight:  80.9 kg  BMI:  Body mass index is 27.27  kg/m.  Estimated Nutritional Needs:   Kcal:  2100-2300  Protein:  120-135 gm  Fluid:  2.2 L  EDUCATION NEEDS:   No education needs identified at this time  Jkwon Treptow A. Mayford KnifeWilliams, RD, LDN, CDE Pager: [phone] After hours Pager: [phone]

## 2016-09-01 NOTE — Progress Notes (Signed)
Progress Note    [name]  ZOX:[id number] DOB: 08/02/1951  DOA: 08/22/2016  PCP: Colon BranchQURESHI, AYYAZ, MD   Brief Narrative:   Chief complaint: Follow-up pneumonia  65 y.o. male with a PMH of severe developmental delay who is nonverbal at baseline, hypertension, DVT on Xarelto, chronic diastolic CHF, and stage III chronic kidney disease who was admitted 08/22/16 for evaluation of lethargy, congestion and hypoxia. He subsequently developed septic shock in the setting of pneumococcal pneumonia and a positive rhinovirus nasal swab. He has been hypoxic and is not a candidate for BiPAP due to his heavy secretions.  Hospital course complicated by inability to pass swallow test but eventually did pass on 11/20, diet was advanced and pt's medication changed from Heparin to Xarelto. PT eval done and determined that pt needs to be discharged to SNF for continuation of physical therapy but since [facility], passar to be obtained prior to discharge per LCSQ.   Assessment/Plan:   Principal Problem:   Sepsis (HCC)/Septic shock due to pneumococcal pneumonia and rhinovirus pneumonitis - has been weaned to nasal cannula. Not a candidate for BiPAP secondary to heavy secretions and altered mental status - Continue physiotherapy vest 4 times a day - Weaned off stress dose steroids. Seen by speech therapist and is at high risk for aspiration, now on dysphagia 3 diet  - Continue Mucomyst.  - Lactate cleared and WBC normalized in the past 72 hours but up this AM, unclear why as no signs of an infectious etiology noted.  - antibiotics 11/11>11/19 Continued Maxipime through 08/29/16.  - Continue bronchodilators      Dysphagia - Has failed ST evaluation and remains NPO - passed swallow eval, regular diet    Hypernatremia - resolved with IVF - BMP in AM    DVT (deep venous thrombosis) (HCC) - On Xarelto as an outpatient. Resume Xarelto per pharmacy  today 11/21    Mental retardation/dementia with superimposed toxic/metabolic encephalopathy - SNF recommended status post PT evaluation - pt more clear this AM, able to follow some commands  - resume Abilify     Chronic diastolic CHF - last ECHO in 12/2015 with grade I diastolic CHF,LV EF: 55% -   60% - stopped IVF as pt now able to take PO Continue  lasix     Absolute anemia - No current indication for transfusion - no signs of active bleeding - CBC In AM    Acute renal failure with acute tubular necrosis superimposed on stage 3 chronic kidney disease (HCC) - Baseline creatinine appears to be around 1.6-1.8 - will repeat BMP in AM    Other specified hypothyroidism - TSH 5.3, and free T4 0.81. Not on replacement therapy.    Pressure injury of skin, stage II, left buttock - Evaluated by wound care RN. Pressure reduction mattress per nursing.  - Continue soft silicone foam for insulation, protection, and management of drainage.    Hematoma right posterior thigh - Hg stable, up from 8.8 --> 9.1 --> 9.5>8.8 - no signs of active bleeding  - CBC in AM    Thrombocytopenia-resolved  - suspect secondary to infection    Family Communication/Anticipated D/C date and plan/Code Status   DVT prophylaxis: Heparin --> transition to oral Xarelto today 11/21 per pharmacy  Code Status: DO NOT RESUSCITATE. Family Communication: No family at the bedside. Disposition Plan: SNF when bed available.   Medical Consultants:    None.  Procedures:  None  Anti-Infectives:   Azithromycin 11/11 > 11/12 Ceftriaxone 11/11 > 11/12 Maxipime 11/12 > 11/19 Vanc 11/12 > 11/14   Subjective:   The patient is minimally verbal, denies chest pain, dyspnea, abd pain.   Objective:    Vitals:   08/31/16 0806 08/31/16 1449 08/31/16 2134 09/01/16 0559  BP:   (!) 93/56 117/80  Pulse:   88 94  Resp:   16 16  Temp:   99.3 F (37.4 C) 97.5 F (36.4 C)  TempSrc:   Oral Oral  SpO2: 93% 94%  96% 96%  Weight:      Height:        Intake/Output Summary (Last 24 hours) at 09/01/16 1251 Last data filed at 09/01/16 0559  Gross per 24 hour  Intake             1120 ml  Output             1700 ml  Net             -580 ml   Filed Weights   08/22/16 0550  Weight: 91.2 kg (201 lb 1 oz)    Exam: General exam: No acute distress, alert and follows commands.  Respiratory system: Respiratory effort normal. Mild rhonhci bibasilar  Cardiovascular system: S1 & S2 heard, RRR. No JVD,  rubs, gallops or clicks. No murmurs. Gastrointestinal system: Abdomen is nondistended, soft and nontender. No organomegaly or masses felt. Normal bowel sounds heard. Central nervous system: Disoriented. No focal neurological deficits. Extremities: No clubbing,  or cyanosis. 1+ edema. Skin: Large hematoma right posterior thigh. Psychiatry: Mood & affect flat.   Data Reviewed:   I have personally reviewed following labs and imaging studies:  Labs: Basic Metabolic Panel:  Recent Labs Lab 08/26/16 0413 08/28/16 0429 08/29/16 0400 08/30/16 1219 09/01/16 0444  NA 150* 148* 144 139 139  K 4.7 3.8 3.6 3.6 3.8  CL 119* 115* 111 106 107  CO2 26 24 26 26 25   GLUCOSE 108* 95 98 98 83  BUN 56* 43* 31* 21* 19  CREATININE 2.74* 1.84* 1.75* 1.55* 1.71*  CALCIUM 8.9 8.7* 8.8* 9.0 8.8*   Liver Function Tests:  Recent Labs Lab 08/26/16 0413  AST 62*  ALT 32  ALKPHOS 75  BILITOT 0.6  PROT 6.3*  ALBUMIN 2.5*    CBC:  Recent Labs Lab 08/28/16 0429 08/29/16 0400 08/30/16 1219 08/31/16 0323 09/01/16 0444  WBC 12.0* 12.4* 10.3 12.2* 12.9*  HGB 8.8* 9.1* 9.5* 9.9* 8.8*  HCT 27.3* 28.4* 29.0* 29.6* 26.5*  MCV 84.3 84.8 82.9 82.2 83.3  PLT 140* 224 298 360 363   CBG:  Recent Labs Lab 08/25/16 1305 08/25/16 1528 08/26/16 1636  GLUCAP 99 99 80   Sepsis Labs:  Recent Labs Lab 08/29/16 0400 08/30/16 1219 08/31/16 0323 09/01/16 0444  WBC 12.4* 10.3 12.2* 12.9*     Microbiology Recent Results (from the past 240 hour(s))  Culture, respiratory (NON-Expectorated)     Status: None   Collection Time: 08/22/16  3:04 PM  Result Value Ref Range Status   Specimen Description TRACHEAL ASPIRATE  Final   Special Requests NONE  Final   Gram Stain   Final    ABUNDANT WBC PRESENT,BOTH PMN AND MONONUCLEAR MODERATE SQUAMOUS EPITHELIAL CELLS PRESENT MODERATE GRAM POSITIVE COCCI IN PAIRS FEW GRAM POSITIVE RODS RARE GRAM NEGATIVE RODS RARE GRAM NEGATIVE COCCI IN PAIRS    Culture Consistent with normal respiratory flora.  Final   Report Status 08/24/2016 FINAL  Final    Radiology: No results found.  Medications:   . acetylcysteine  2 mL Nebulization BID  . ARIPiprazole  30 mg Oral Daily  . feeding supplement (NEPRO CARB STEADY)  237 mL Oral Q24H  . ferrous sulfate  325 mg Oral Q breakfast  . furosemide  20 mg Oral BID  . levalbuterol  0.63 mg Nebulization TID  . mouth rinse  15 mL Mouth Rinse BID  . rivaroxaban  20 mg Oral Q supper   Continuous Infusions:   LOS: 10 days   [facility] TownshipBROL,Trachelle Low  [facility] Pager [phone]  *Please refer to [email or web address], password [email or web address] to get updated schedule on who will round on this patient, as hospitalists switch teams weekly. If 7PM-7AM, please contact night-coverage at [email or web address], password [email or web address] for any overnight needs.  09/01/2016, 12:51 PM

## 2016-09-01 NOTE — Progress Notes (Signed)
RT given breathing treatment and CPT. Patient started yelling "take it off" so I did. When I tried to convince him to put it back on he swung at me and said "no". Patient is continuing with CPT however.

## 2016-09-01 NOTE — Progress Notes (Signed)
LCSW continues to await for Level 2 passar. State has been assigned to patient and someone will come out and see patient hopefully before the holiday, but there is no guarantee.  Will continue to follow with plan being  [facility]  Kaylinn Dedic LCSW, MSW Clinical Social Work: System [employer] Coverage for :  [phone]

## 2016-09-02 LAB — COMPREHENSIVE METABOLIC PANEL
ALBUMIN: 2.3 g/dL — AB (ref 3.5–5.0)
ALT: 36 U/L (ref 17–63)
ANION GAP: 6 (ref 5–15)
AST: 72 U/L — ABNORMAL HIGH (ref 15–41)
Alkaline Phosphatase: 85 U/L (ref 38–126)
BILIRUBIN TOTAL: 0.6 mg/dL (ref 0.3–1.2)
BUN: 21 mg/dL — ABNORMAL HIGH (ref 6–20)
CALCIUM: 8.7 mg/dL — AB (ref 8.9–10.3)
CO2: 27 mmol/L (ref 22–32)
Chloride: 107 mmol/L (ref 101–111)
Creatinine, Ser: 1.83 mg/dL — ABNORMAL HIGH (ref 0.61–1.24)
GFR, EST AFRICAN AMERICAN: 44 mL/min — AB (ref >60)
GFR, EST NON AFRICAN AMERICAN: 38 mL/min — AB (ref >60)
Glucose, Bld: 82 mg/dL (ref 65–99)
POTASSIUM: 3.6 mmol/L (ref 3.5–5.1)
Sodium: 140 mmol/L (ref 135–145)
TOTAL PROTEIN: 5.9 g/dL — AB (ref 6.5–8.1)

## 2016-09-02 LAB — CBC
HEMATOCRIT: 25 % — AB (ref 39.0–52.0)
Hemoglobin: 8.3 g/dL — ABNORMAL LOW (ref 13.0–17.0)
MCH: 28 pg (ref 26.0–34.0)
MCHC: 33.2 g/dL (ref 30.0–36.0)
MCV: 84.5 fL (ref 78.0–100.0)
Platelets: 386 10*3/uL (ref 150–400)
RBC: 2.96 MIL/uL — ABNORMAL LOW (ref 4.22–5.81)
RDW: 17.9 % — AB (ref 11.5–15.5)
WBC: 12.7 10*3/uL — ABNORMAL HIGH (ref 4.0–10.5)

## 2016-09-02 MED ORDER — POLYETHYLENE GLYCOL 3350 17 G PO PACK
17.0000 g | PACK | Freq: Every day | ORAL | Status: DC | PRN
Start: 2016-09-02 — End: 2016-09-04
  Administered 2016-09-03: 17 g via ORAL

## 2016-09-02 MED ORDER — SENNOSIDES-DOCUSATE SODIUM 8.6-50 MG PO TABS
2.0000 | ORAL_TABLET | Freq: Every evening | ORAL | Status: DC | PRN
  Administered 2016-09-03 (×2): 2 via ORAL
  Filled 2016-09-02 (×2): qty 2
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
## 2016-09-02 NOTE — Progress Notes (Signed)
Progress Note    [name]  ZOX:[id number] DOB: 04/05/1951  DOA: 08/22/2016  PCP: Colon BranchQURESHI, AYYAZ, MD   Brief Narrative:   Chief complaint: Follow-up pneumonia  65 y.o. male with a PMH of severe developmental delay who is nonverbal at baseline, hypertension, DVT on Xarelto, chronic diastolic CHF, and stage III chronic kidney disease who was admitted 08/22/16 for evaluation of lethargy, congestion and hypoxia. He subsequently developed septic shock in the setting of pneumococcal pneumonia and a positive rhinovirus nasal swab. He has been hypoxic and is not a candidate for BiPAP due to his heavy secretions.  Hospital course complicated by inability to pass swallow test but eventually did pass on 11/20, diet was advanced and pt's medication changed from Heparin to Xarelto. PT eval done and determined that pt needs to be discharged to SNF for continuation of physical therapy but since [facility], passar to be obtained prior to discharge per LCSQ.   Assessment/Plan:   Principal Problem:   Sepsis (HCC)/Septic shock due to pneumococcal pneumonia and rhinovirus pneumonitis - has been weaned to nasal cannula. Not a candidate for BiPAP secondary to heavy secretions and altered mental status - Continue physiotherapy vest 4 times a day - Weaned off stress dose steroids. Seen by speech therapist and is at high risk for aspiration, now on dysphagia 3 diet  - Continue Mucomyst.  - Lactate cleared and WBC normalized in the past 72 hours but up this AM, unclear why as no signs of an infectious etiology noted.  - antibiotics 11/11>11/19 Continued Maxipime through 08/29/16.  - Continue bronchodilators, But patient refusing, currently on 3 L of nasal cannula      Dysphagia - passed swallow eval, regular diet    Hypernatremia - resolved with IVF - BMP in AM    DVT (deep venous thrombosis) (HCC) - On Xarelto as an outpatient. Resumed Xarelto per  pharmacy  11/21    Mental retardation/dementia with superimposed toxic/metabolic encephalopathy - SNF recommended status post PT evaluation,LCSW continues to await for Level 2 passar. Continue Abilify     Chronic diastolic CHF - last ECHO in 12/2015 with grade I diastolic CHF,LV EF: 55% -   60% - stopped IVF as pt now able to take PO Continue  lasix     Absolute anemia - No current indication for transfusion - no signs of active bleeding - CBC In AM    Acute renal failure with acute tubular necrosis superimposed on stage 3 chronic kidney disease (HCC) - Baseline creatinine appears to be around 1.6-1.8 - will repeat BMP in AM    Other specified hypothyroidism - TSH 5.3, and free T4 0.81. Not on replacement therapy.    Pressure injury of skin, stage II, left buttock - Evaluated by wound care RN. Pressure reduction mattress per nursing.  - Continue soft silicone foam for insulation, protection, and management of drainage.    Hematoma right posterior thigh - Hg stable, up from 8.8 --> 9.1 --> 9.5>8.8 - no signs of active bleeding  - CBC in AM    Thrombocytopenia-resolved  - suspect secondary to infection    Family Communication/Anticipated D/C date and plan/Code Status   DVT prophylaxis: Heparin --> transition to oral Xarelto today 11/21 per pharmacy  Code Status: DO NOT RESUSCITATE. Family Communication: No family at the bedside. Disposition Plan: SNF when bed available.   Medical Consultants:    None.  Procedures:    None  Anti-Infectives:   Azithromycin 11/11 > 11/12 Ceftriaxone 11/11 > 11/12 Maxipime 11/12 > 11/19 Vanc 11/12 > 11/14   Subjective:   The patient is minimally verbal, denies chest pain, dyspnea, abd pain.   Objective:    Vitals:   09/01/16 1457 09/01/16 2007 09/01/16 2045 09/02/16 0617  BP:  122/88  135/69  Pulse:  83  88  Resp:  19  19  Temp:  98.1 F (36.7 C)  97.4 F (36.3 C)  TempSrc:  Oral  Axillary  SpO2: 97% 98% 95% 95%    Weight:      Height:        Intake/Output Summary (Last 24 hours) at 09/02/16 0802 Last data filed at 09/01/16 2200  Gross per 24 hour  Intake              640 ml  Output             1000 ml  Net             -360 ml   Filed Weights   08/22/16 0550  Weight: 91.2 kg (201 lb 1 oz)    Exam: General exam: No acute distress, alert and follows commands.  Respiratory system: Respiratory effort normal. Mild rhonhci bibasilar  Cardiovascular system: S1 & S2 heard, RRR. No JVD,  rubs, gallops or clicks. No murmurs. Gastrointestinal system: Abdomen is nondistended, soft and nontender. No organomegaly or masses felt. Normal bowel sounds heard. Central nervous system: Disoriented. No focal neurological deficits. Extremities: No clubbing,  or cyanosis. 1+ edema. Skin: Large hematoma right posterior thigh. Psychiatry: Mood & affect flat.   Data Reviewed:   I have personally reviewed following labs and imaging studies:  Labs: Basic Metabolic Panel:  Recent Labs Lab 08/28/16 0429 08/29/16 0400 08/30/16 1219 09/01/16 0444 09/02/16 0549  NA 148* 144 139 139 140  K 3.8 3.6 3.6 3.8 3.6  CL 115* 111 106 107 107  CO2 24 26 26 25 27   GLUCOSE 95 98 98 83 82  BUN 43* 31* 21* 19 21*  CREATININE 1.84* 1.75* 1.55* 1.71* 1.83*  CALCIUM 8.7* 8.8* 9.0 8.8* 8.7*   Liver Function Tests:  Recent Labs Lab 09/02/16 0549  AST 72*  ALT 36  ALKPHOS 85  BILITOT 0.6  PROT 5.9*  ALBUMIN 2.3*    CBC:  Recent Labs Lab 08/29/16 0400 08/30/16 1219 08/31/16 0323 09/01/16 0444 09/02/16 0549  WBC 12.4* 10.3 12.2* 12.9* 12.7*  HGB 9.1* 9.5* 9.9* 8.8* 8.3*  HCT 28.4* 29.0* 29.6* 26.5* 25.0*  MCV 84.8 82.9 82.2 83.3 84.5  PLT 224 298 360 363 386   CBG:  Recent Labs Lab 08/26/16 1636  GLUCAP 80   Sepsis Labs:  Recent Labs Lab 08/30/16 1219 08/31/16 0323 09/01/16 0444 09/02/16 0549  WBC 10.3 12.2* 12.9* 12.7*    Microbiology No results found for this or any previous visit  (from the past 240 hour(s)).  Radiology: No results found.  Medications:   . acetylcysteine  2 mL Nebulization BID  . ARIPiprazole  30 mg Oral Daily  . feeding supplement (NEPRO CARB STEADY)  237 mL Oral Q24H  . ferrous sulfate  325 mg Oral Q breakfast  . furosemide  20 mg Oral BID  . levalbuterol  0.63 mg Nebulization TID  . mouth rinse  15 mL Mouth Rinse BID  . rivaroxaban  20 mg Oral Q supper   Continuous Infusions:   LOS: 11 days   [facility] SouthBROL,Lateesha Bezold  [facility] Pager  [phone]  *Please refer to [email or web address], password [email or web address] to get updated schedule on who will round on this patient, as hospitalists switch teams weekly. If 7PM-7AM, please contact night-coverage at [email or web address], password [email or web address] for any overnight needs.  09/02/2016, 8:02 AM

## 2016-09-03 LAB — CBC
HCT: 25.7 % — ABNORMAL LOW (ref 39.0–52.0)
HEMOGLOBIN: 8.4 g/dL — AB (ref 13.0–17.0)
MCH: 27.7 pg (ref 26.0–34.0)
MCHC: 32.7 g/dL (ref 30.0–36.0)
MCV: 84.8 fL (ref 78.0–100.0)
Platelets: 502 10*3/uL — ABNORMAL HIGH (ref 150–400)
RBC: 3.03 MIL/uL — AB (ref 4.22–5.81)
RDW: 17.6 % — ABNORMAL HIGH (ref 11.5–15.5)
WBC: 11.4 10*3/uL — ABNORMAL HIGH (ref 4.0–10.5)

## 2016-09-03 NOTE — Progress Notes (Signed)
Progress Note    [name]  [id number] DOB: 01/25/1951  [name] BakeDOA: 08/22/2016  PCP: [name], AYYAZ, MD   Brief Narrative:   Chief complaint: Follow-up pneumonia  65 y.o. male with a PMH of severe developmental delay who is nonverbal at baseline, hypertension, DVT on Xarelto, chronic diastolic CHF, and stage III chronic kidney disease who was admitted 08/22/16 for evaluation of lethargy, congestion and hypoxia. He subsequently developed septic shock in the setting of pneumococcal pneumonia and a positive rhinovirus nasal swab. He has been hypoxic and is not a candidate for BiPAP due to his heavy secretions.  Hospital course complicated by inability to pass swallow test but eventually did pass on 11/20, diet was advanced and pt's medication changed from Heparin to Xarelto. PT eval done and determined that pt needs to be discharged to SNF for continuation of physical therapy but since [facility], passar to be obtained prior to discharge per LCSQ.   Assessment/Plan:   Principal Problem:   Sepsis (HCC)/Septic shock due to pneumococcal pneumonia and rhinovirus pneumonitis - has been weaned to nasal cannula. Not a candidate for BiPAP secondary to heavy secretions and altered mental status - Continue physiotherapy vest 4 times a day,  adamantly refused all RT therapies   - Weaned off stress dose steroids. Seen by speech therapist and is at high risk for aspiration, now on dysphagia 3 diet  - Continue Mucomyst.  - Lactate cleared and WBC normalized in the past 72 hours but up this AM, unclear why as no signs of an infectious etiology noted.  - antibiotics 11/11>11/19 Continued Maxipime through 08/29/16.  Continue bronchodilators,   currently on 3 L of nasal cannula      Dysphagia - passed swallow eval, regular diet    Hypernatremia - resolved with IVF - BMP in AM    DVT (deep venous thrombosis) (HCC) - On Xarelto as an outpatient.  Resumed Xarelto per pharmacy  11/21    Mental retardation/dementia with superimposed toxic/metabolic encephalopathy - SNF recommended status post PT evaluation,LCSW continues to await for Level 2 passar. Continue Abilify     Chronic diastolic CHF - last ECHO in 12/2015 with grade I diastolic CHF,LV EF: 55% -   60% - stopped IVF as pt now able to take PO Continue  lasix     Absolute anemia, baseline hg 11.0 - No current indication for transfusion - no signs of active bleeding  hg now 8.4    Acute renal failure with acute tubular necrosis superimposed on stage 3 chronic kidney disease (HCC) - Baseline creatinine appears to be around 1.6-1.8   BMP at baseline     Other specified hypothyroidism - TSH 5.3, and free T4 0.81. Not on replacement therapy.    Pressure injury of skin, stage II, left buttock - Evaluated by wound care RN. Pressure reduction mattress per nursing.  - Continue soft silicone foam for insulation, protection, and management of drainage.    Hematoma right posterior thigh - Hg stable, up from 8.8 --> 9.1 --> 9.5>8.8 - no signs of active bleeding  - CBC in AM    Thrombocytopenia-resolved  - suspect secondary to infection    Family Communication/Anticipated D/C date and plan/Code Status   DVT prophylaxis: Heparin --> transition to oral Xarelto today 11/21 per pharmacy  Code Status: DO NOT RESUSCITATE. Family Communication: No family at the bedside. Disposition Plan: SNF when bed available.   Medical Consultants:  None.  Procedures:    None  Anti-Infectives:   Azithromycin 11/11 > 11/12 Ceftriaxone 11/11 > 11/12 Maxipime 11/12 > 11/19 Vanc 11/12 > 11/14   Subjective:   The patient is minimally verbal, denies chest pain, dyspnea, abd pain.   Objective:    Vitals:   09/02/16 1320 09/02/16 2101 09/02/16 2115 09/03/16 0355  BP: (!) 117/58 116/78  129/89  Pulse: 82 91  95  Resp: 18 18  18   Temp: 97.6 F (36.4 C) 97.6 F (36.4 C)  98.6  F (37 C)  TempSrc: Axillary Oral  Oral  SpO2: 95% 97% 98%   Weight:      Height:        Intake/Output Summary (Last 24 hours) at 09/03/16 0738 Last data filed at 09/03/16 0300  Gross per 24 hour  Intake              720 ml  Output              850 ml  Net             -130 ml   Filed Weights   08/22/16 0550  Weight: 91.2 kg (201 lb 1 oz)    Exam: General exam: No acute distress, alert and follows commands.  Respiratory system: Respiratory effort normal. Mild rhonhci bibasilar  Cardiovascular system: S1 & S2 heard, RRR. No JVD,  rubs, gallops or clicks. No murmurs. Gastrointestinal system: Abdomen is nondistended, soft and nontender. No organomegaly or masses felt. Normal bowel sounds heard. Central nervous system: Disoriented. No focal neurological deficits. Extremities: No clubbing,  or cyanosis. 1+ edema. Skin: Large hematoma right posterior thigh. Psychiatry: Mood & affect flat.   Data Reviewed:   I have personally reviewed following labs and imaging studies:  Labs: Basic Metabolic Panel:  Recent Labs Lab 08/28/16 0429 08/29/16 0400 08/30/16 1219 09/01/16 0444 09/02/16 0549  NA 148* 144 139 139 140  K 3.8 3.6 3.6 3.8 3.6  CL 115* 111 106 107 107  CO2 24 26 26 25 27   GLUCOSE 95 98 98 83 82  BUN 43* 31* 21* 19 21*  CREATININE 1.84* 1.75* 1.55* 1.71* 1.83*  CALCIUM 8.7* 8.8* 9.0 8.8* 8.7*   Liver Function Tests:  Recent Labs Lab 09/02/16 0549  AST 72*  ALT 36  ALKPHOS 85  BILITOT 0.6  PROT 5.9*  ALBUMIN 2.3*    CBC:  Recent Labs Lab 08/30/16 1219 08/31/16 0323 09/01/16 0444 09/02/16 0549 09/03/16 0616  WBC 10.3 12.2* 12.9* 12.7* 11.4*  HGB 9.5* 9.9* 8.8* 8.3* 8.4*  HCT 29.0* 29.6* 26.5* 25.0* 25.7*  MCV 82.9 82.2 83.3 84.5 84.8  PLT 298 360 363 386 502*   CBG: No results for input(s): GLUCAP in the last 168 hours. Sepsis Labs:  Recent Labs Lab 08/31/16 0323 09/01/16 0444 09/02/16 0549 09/03/16 0616  WBC 12.2* 12.9* 12.7* 11.4*     Microbiology No results found for this or any previous visit (from the past 240 hour(s)).  Radiology: No results found.  Medications:   . acetylcysteine  2 mL Nebulization BID  . ARIPiprazole  30 mg Oral Daily  . feeding supplement (NEPRO CARB STEADY)  237 mL Oral Q24H  . ferrous sulfate  325 mg Oral Q breakfast  . furosemide  20 mg Oral BID  . levalbuterol  0.63 mg Nebulization TID  . mouth rinse  15 mL Mouth Rinse BID  . rivaroxaban  20 mg Oral Q supper   Continuous Infusions:  LOS: 12 days   [facility] CampusBROL,Onda Kattner  [facility] Pager [phone]  *Please refer to [email or web address], password [email or web address] to get updated schedule on who will round on this patient, as hospitalists switch teams weekly. If 7PM-7AM, please contact night-coverage at [email or web address], password [email or web address] for any overnight needs.  09/03/2016, 7:38 AM

## 2016-09-03 NOTE — Progress Notes (Signed)
Physical Therapy Treatment Patient Details Name: [name] MRN: [id number] DOB: 07/14/1951 Today's Date: 09/03/2016    History of Present Illness 65 y.o. 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  EMS found him lethargic, congested, and hypoxic at 88% on room air.  In the ED he was found to be hypothermic and CXR showed atelectasis vs opacity in the right base    PT Comments    Pt presents with a minimal improvement in communication. Pt would like to get OOB, but is unabe to achieve standing with multiple attempts. Pt was left in bed, comfortable, with call bell within reach. Pt made improvements in bed mobility to the EOB this session as compared to previous session but continues to require total A to max A for all mobility at this time.    Follow Up Recommendations  SNF     Equipment Recommendations  None recommended by PT    Recommendations for Other Services       Precautions / Restrictions Precautions Precautions: Fall Precaution Comments: contractures/skin breakdown Restrictions Weight Bearing Restrictions: No    Mobility  Bed Mobility Overal bed mobility: Needs Assistance;+2 for physical assistance Bed Mobility: Sidelying to Sit;Rolling;Sit to Sidelying Rolling: Total assist;+2 for physical assistance Sidelying to sit: Mod assist;+2 for physical assistance     Sit to sidelying: Mod assist;+2 for physical assistance General bed mobility comments: initiates bed mobility this session by moving trunk into flexion when LE's are lowered off EOB  Transfers Overall transfer level: Needs assistance Equipment used: None Transfers: Sit to/from Stand Sit to Stand: Total assist (unable to perfrom sit to stans this session)            Ambulation/Gait                 Stairs            Wheelchair Mobility    Modified Rankin (Stroke  Patients Only)       Balance Overall balance assessment: Needs assistance Sitting-balance support: Feet supported;Bilateral upper extremity supported Sitting balance-Leahy Scale: Fair Sitting balance - Comments: Min guard sitting EOB Postural control: Other (comment) (forward trunk lean due to fixed flexed cervical spine)                          Cognition Arousal/Alertness: Awake/alert Behavior During Therapy: Flat affect Overall Cognitive Status: Difficult to assess                      Exercises      General Comments        Pertinent Vitals/Pain Pain Assessment: No/denies pain Faces Pain Scale: Hurts even more Pain Location: unsure, but moans when moved from supine to sit Pain Descriptors / Indicators: Grimacing;Guarding;Moaning Pain Intervention(s): Monitored during session;Repositioned    Home Living                      Prior Function            PT Goals (current goals can now be found in the care plan section) Acute Rehab PT Goals Patient Stated Goal: Unable to state Progress towards PT goals: Progressing toward goals    Frequency    Min 2X/week      PT Plan Current plan remains appropriate    Co-evaluation  End of Session Equipment Utilized During Treatment: Gait belt;Oxygen Activity Tolerance: Patient tolerated treatment well;Patient limited by lethargy Patient left: in bed;with call bell/phone within reach;with bed alarm set     Time: [id number] PT Time Calculation (min) (ACUTE ONLY): 24 min  Charges:  $Therapeutic Activity: 23-37 mins                    G Codes:      Colin BroachSabra M. Rodricus Candelaria PT, DPT  [phone]  09/03/2016, 10:25 AM

## 2016-09-03 NOTE — Plan of Care (Signed)
Pt is constipated on call Dr. Craige CottaKirby ordered senokot 2 tab given as ordered, no bowel movement at this time, will continue to monitor.

## 2016-09-04 LAB — CBC
HEMATOCRIT: 26 % — AB (ref 39.0–52.0)
HEMOGLOBIN: 8.5 g/dL — AB (ref 13.0–17.0)
MCH: 27.9 pg (ref 26.0–34.0)
MCHC: 32.7 g/dL (ref 30.0–36.0)
MCV: 85.2 fL (ref 78.0–100.0)
Platelets: 522 10*3/uL — ABNORMAL HIGH (ref 150–400)
RBC: 3.05 MIL/uL — ABNORMAL LOW (ref 4.22–5.81)
RDW: 17.4 % — AB (ref 11.5–15.5)
WBC: 9.9 10*3/uL (ref 4.0–10.5)

## 2016-09-04 MED ORDER — POLYETHYLENE GLYCOL 3350 17 G PO PACK
17.0000 g | PACK | Freq: Two times a day (BID) | ORAL | Status: DC
  Administered 2016-09-04 – 2016-09-07 (×7): 17 g via ORAL
  Filled 2016-09-04 (×7): qty 1

## 2016-09-04 MED ORDER — BISACODYL 10 MG RE SUPP
10.0000 mg | Freq: Once | RECTAL | Status: AC
  Administered 2016-09-04: 10 mg via RECTAL
  Filled 2016-09-04: qty 1

## 2016-09-04 NOTE — Progress Notes (Signed)
Progress Note    [name]  ZOX:[id number] DOB: [birth year]  DOA: 08/22/2016  PCP: Colon Branch, MD   Brief Narrative:   Chief complaint: Follow-up pneumonia  65 y.o. male with a PMH of severe developmental delay who is nonverbal at baseline, hypertension, DVT on Xarelto, chronic diastolic CHF, and stage III chronic kidney disease who was admitted 08/22/16 for evaluation of lethargy, congestion and hypoxia. He subsequently developed septic shock in the setting of pneumococcal pneumonia and a positive rhinovirus nasal swab. He has been hypoxic and is not a candidate for BiPAP due to his heavy secretions.  Hospital course complicated by inability to pass swallow test but eventually did pass on 11/20, diet was advanced and pt's medication changed from Heparin to Xarelto. PT eval done and determined that pt needs to be discharged to SNF for continuation of physical therapy but since patient is a new placement at [facility], passar to be obtained prior to discharge per LCSQ.   Assessment/Plan:   Principal Problem:   Sepsis (HCC)/Septic shock due to pneumococcal pneumonia and rhinovirus pneumonitis - has been weaned to nasal cannula. Not a candidate for BiPAP secondary to heavy secretions and altered mental status - Continue physiotherapy vest 4 times a day,  adamantly refused all RT therapies   - Weaned off stress dose steroids. Seen by speech therapist and is at high risk for aspiration, now on dysphagia 3 diet  - Continue Mucomyst.  - Lactate cleared and WBC normalized in the past 72 hours but up this AM, unclear why as no signs of an infectious etiology noted.  - antibiotics 11/11>11/19. Currently respiratory status is stable Completed Maxipime through 08/29/16.  Continue bronchodilators,   currently on 3 L of nasal cannula  Constipation Increased dose of MiraLAX and start patient on Dulcolax suppository      Dysphagia - passed swallow eval, diet as  above    Hypernatremia - resolved with IVF - BMP in AM    DVT (deep venous thrombosis) (HCC) - On Xarelto as an outpatient. Resumed Xarelto per pharmacy  11/21    Mental retardation/dementia with superimposed toxic/metabolic encephalopathy - SNF recommended status post PT evaluation,LCSW continues to await for Level 2 passar. Continue Abilify     Chronic diastolic CHF - last ECHO in 12/2015 with grade I diastolic CHF,LV EF: 55% -   60% - stopped IVF as pt now able to take PO Continue  lasix     Absolute anemia, baseline hg 11.0 - No current indication for transfusion - no signs of active bleeding  hg now 8.4    Acute renal failure with acute tubular necrosis superimposed on stage 3 chronic kidney disease (HCC) - Baseline creatinine appears to be around 1.6-1.8   BMP at baseline     Other specified hypothyroidism - TSH 5.3, and free T4 0.81. Not on replacement therapy.    Pressure injury of skin, stage II, left buttock - Evaluated by wound care RN. Pressure reduction mattress per nursing.  - Continue soft silicone foam for insulation, protection, and management of drainage.    Hematoma right posterior thigh - Hg stable, up from 8.8 --> 9.1 --> 9.5>8.8 - no signs of active bleeding  - CBC in AM    Thrombocytopenia-resolved  - suspect secondary to infection    Family Communication/Anticipated D/C date and plan/Code Status   DVT prophylaxis: Heparin --> transition to oral Xarelto today 11/21 per pharmacy  Code Status: DO NOT RESUSCITATE.  Family Communication: No family at the bedside. Disposition Plan: SNF when bed available.   Medical Consultants:    None.  Procedures:    None  Anti-Infectives:   Azithromycin 11/11 > 11/12 Ceftriaxone 11/11 > 11/12 Maxipime 11/12 > 11/19 Vanc 11/12 > 11/14   Subjective:   Constipated , no nausea   Objective:    Vitals:   09/03/16 0355 09/03/16 1401 09/03/16 2143 09/04/16 0503  BP: 129/89 103/69 107/68 111/76   Pulse: 95 98 86 85  Resp: 18 18 17 16   Temp: 98.6 F (37 C) 99.1 F (37.3 C) 98.3 F (36.8 C) 97.7 F (36.5 C)  TempSrc: Oral Oral Oral   SpO2:  98% 100% 98%  Weight:      Height:        Intake/Output Summary (Last 24 hours) at 09/04/16 0745 Last data filed at 09/04/16 0504  Gross per 24 hour  Intake              600 ml  Output             2700 ml  Net            -2100 ml   Filed Weights   08/22/16 0550  Weight: 91.2 kg (201 lb 1 oz)    Exam: General exam: No acute distress, alert and follows commands.  Respiratory system: Respiratory effort normal. Mild rhonhci bibasilar  Cardiovascular system: S1 & S2 heard, RRR. No JVD,  rubs, gallops or clicks. No murmurs. Gastrointestinal system: Abdomen is nondistended, soft and nontender. No organomegaly or masses felt. Normal bowel sounds heard. Central nervous system: Disoriented. No focal neurological deficits. Extremities: No clubbing,  or cyanosis. 1+ edema. Skin: Large hematoma right posterior thigh. Psychiatry: Mood & affect flat.   Data Reviewed:   I have personally reviewed following labs and imaging studies:  Labs: Basic Metabolic Panel:  Recent Labs Lab 08/29/16 0400 08/30/16 1219 09/01/16 0444 09/02/16 0549  NA 144 139 139 140  K 3.6 3.6 3.8 3.6  CL 111 106 107 107  CO2 26 26 25 27   GLUCOSE 98 98 83 82  BUN 31* 21* 19 21*  CREATININE 1.75* 1.55* 1.71* 1.83*  CALCIUM 8.8* 9.0 8.8* 8.7*   Liver Function Tests:  Recent Labs Lab 09/02/16 0549  AST 72*  ALT 36  ALKPHOS 85  BILITOT 0.6  PROT 5.9*  ALBUMIN 2.3*    CBC:  Recent Labs Lab 08/31/16 0323 09/01/16 0444 09/02/16 0549 09/03/16 0616 09/04/16 0314  WBC 12.2* 12.9* 12.7* 11.4* 9.9  HGB 9.9* 8.8* 8.3* 8.4* 8.5*  HCT 29.6* 26.5* 25.0* 25.7* 26.0*  MCV 82.2 83.3 84.5 84.8 85.2  PLT 360 363 386 502* 522*   CBG: No results for input(s): GLUCAP in the last 168 hours. Sepsis Labs:  Recent Labs Lab 09/01/16 0444 09/02/16 0549  09/03/16 0616 09/04/16 0314  WBC 12.9* 12.7* 11.4* 9.9    Microbiology No results found for this or any previous visit (from the past 240 hour(s)).  Radiology: No results found.  Medications:   . ARIPiprazole  30 mg Oral Daily  . bisacodyl  10 mg Rectal Once  . feeding supplement (NEPRO CARB STEADY)  237 mL Oral Q24H  . ferrous sulfate  325 mg Oral Q breakfast  . furosemide  20 mg Oral BID  . mouth rinse  15 mL Mouth Rinse BID  . polyethylene glycol  17 g Oral BID  . rivaroxaban  20 mg Oral Q supper  Continuous Infusions:   LOS: 13 days   [facility] HospitalBROL,[name]  [facility] Pager [phone]  *Please refer to [email or web address], password [email or web address] to get updated schedule on who will round on this patient, as hospitalists switch teams weekly. If 7PM-7AM, please contact night-coverage at [email or web address], password [email or web address] for any overnight needs.  09/04/2016, 7:45 AM

## 2016-09-04 NOTE — Progress Notes (Signed)
Pt still no BM given PRN meds paged on call MD.

## 2016-09-05 LAB — CBC
HCT: 27.6 % — ABNORMAL LOW (ref 39.0–52.0)
Hemoglobin: 8.9 g/dL — ABNORMAL LOW (ref 13.0–17.0)
MCH: 27.6 pg (ref 26.0–34.0)
MCHC: 32.2 g/dL (ref 30.0–36.0)
MCV: 85.4 fL (ref 78.0–100.0)
PLATELETS: 557 10*3/uL — AB (ref 150–400)
RBC: 3.23 MIL/uL — AB (ref 4.22–5.81)
RDW: 17.1 % — ABNORMAL HIGH (ref 11.5–15.5)
WBC: 10.4 10*3/uL (ref 4.0–10.5)

## 2016-09-05 NOTE — Clinical Social Work Note (Addendum)
Currently awaiting: [place] MUST LEVEL II PASARR.  [facility] once SNF PASARR obtained.   Derenda FennelBashira Kimeka Badour, MSW [phone] 09/05/2016 11:51 AM

## 2016-09-05 NOTE — Progress Notes (Signed)
Progress Note    [name]  WUJ:[id number] DOB: Feb 19, 1951  DOA: 08/22/2016  PCP: Colon Branch, MD   Brief Narrative:   Chief complaint: Follow-up pneumonia  65 y.o. male with a PMH of severe developmental delay who is nonverbal at baseline, hypertension, DVT on Xarelto, chronic diastolic CHF, and stage III chronic kidney disease who was admitted 08/22/16 for evaluation of lethargy, congestion and hypoxia. He subsequently developed septic shock in the setting of pneumococcal pneumonia and a positive rhinovirus nasal swab. He has been hypoxic and is not a candidate for BiPAP due to his heavy secretions.  Hospital course complicated by inability to pass swallow test but eventually did pass on 11/20, diet was advanced and pt's medication changed from Heparin to Xarelto. PT eval done and determined that pt needs to be discharged to SNF for continuation of physical therapy but since patient is a new placement at [facility], passar to be obtained prior to discharge per LCSQ.   Assessment/Plan:       Sepsis (HCC)/Septic shock due to pneumococcal pneumonia and rhinovirus pneumonitis - has been weaned to nasal cannula, Stable around 3 L. Not a candidate for BiPAP secondary to heavy secretions and altered mental status - Continue physiotherapy vest 4 times a day,  adamantly refused all RT therapies   - Weaned off stress dose steroids. Seen by speech therapist and is at high risk for aspiration, now on dysphagia 3 diet  - Continue Mucomyst.  - Lactate cleared and WBC normalized in the past 72 hours but up this AM, unclear why as no signs of an infectious etiology noted.  - antibiotics 11/11>11/19. Currently respiratory status is stable Completed Maxipime through 08/29/16.  Continue bronchodilators,     Awaiting placement  Constipation Continue MiraLAX and start patient on Dulcolax suppository Had a BM yesterday     Dysphagia - passed swallow eval, diet as  above    Hypernatremia - resolved with IVF - BMP in AM    DVT (deep venous thrombosis) (HCC) - On Xarelto as an outpatient. Resumed Xarelto per pharmacy  11/21    Mental retardation/dementia with superimposed toxic/metabolic encephalopathy - SNF recommended status post PT evaluation,LCSW continues to await for Level 2 passar. Continue Abilify     Chronic diastolic CHF - last ECHO in 12/2015 with grade I diastolic CHF,LV EF: 55% -   60% - stopped IVF as pt now able to take PO Continue  lasix     Absolute anemia, baseline hg 11.0 - No current indication for transfusion - no signs of active bleeding  hg now 8.4>8.9    Acute renal failure with acute tubular necrosis superimposed on stage 3 chronic kidney disease (HCC) - Baseline creatinine appears to be around 1.6-1.8 Follow BMP intermittently     Other specified hypothyroidism - TSH 5.3, and free T4 0.81. Not on replacement therapy.    Pressure injury of skin, stage II, left buttock - Evaluated by wound care RN. Pressure reduction mattress per nursing.  - Continue soft silicone foam for insulation, protection, and management of drainage.    Hematoma right posterior thigh - Hg stable, up from 8.8 --> 9.1 --> 9.5>8.9 - no signs of active bleeding  - CBC in AM    Thrombocytopenia-resolved  - suspect secondary to infection    Family Communication/Anticipated D/C date and plan/Code Status   DVT prophylaxis: Heparin --> transition to oral Xarelto today 11/21 per pharmacy  Code Status: DO NOT RESUSCITATE.  Family Communication: No family at the bedside. Disposition Plan: SNF when bed available.   Medical Consultants:    None.  Procedures:    None  Anti-Infectives:   Azithromycin 11/11 > 11/12 Ceftriaxone 11/11 > 11/12 Maxipime 11/12 > 11/19 Vanc 11/12 > 11/14   Subjective:   Constipation resolved  Objective:    Vitals:   09/04/16 0503 09/04/16 1423 09/04/16 2111 09/05/16 0521  BP: 111/76 (!) 141/79  131/87 (!) 144/97  Pulse: 85 88 88 85  Resp: 16 16 17 16   Temp: 97.7 F (36.5 C) 97.4 F (36.3 C) 98 F (36.7 C) 97.7 F (36.5 C)  TempSrc:  Oral Oral Oral  SpO2: 98% 98% 99% 97%  Weight:      Height:        Intake/Output Summary (Last 24 hours) at 09/05/16 0942 Last data filed at 09/05/16 0412  Gross per 24 hour  Intake              958 ml  Output             1700 ml  Net             -742 ml   Filed Weights   08/22/16 0550  Weight: 91.2 kg (201 lb 1 oz)    Exam: General exam: No acute distress, alert and follows commands.  Respiratory system: Respiratory effort normal. Mild rhonhci bibasilar  Cardiovascular system: S1 & S2 heard, RRR. No JVD,  rubs, gallops or clicks. No murmurs. Gastrointestinal system: Abdomen is nondistended, soft and nontender. No organomegaly or masses felt. Normal bowel sounds heard. Central nervous system: Disoriented. No focal neurological deficits. Extremities: No clubbing,  or cyanosis. 1+ edema. Skin: Large hematoma right posterior thigh. Psychiatry: Mood & affect flat.   Data Reviewed:   I have personally reviewed following labs and imaging studies:  Labs: Basic Metabolic Panel:  Recent Labs Lab 08/30/16 1219 09/01/16 0444 09/02/16 0549  NA 139 139 140  K 3.6 3.8 3.6  CL 106 107 107  CO2 26 25 27   GLUCOSE 98 83 82  BUN 21* 19 21*  CREATININE 1.55* 1.71* 1.83*  CALCIUM 9.0 8.8* 8.7*   Liver Function Tests:  Recent Labs Lab 09/02/16 0549  AST 72*  ALT 36  ALKPHOS 85  BILITOT 0.6  PROT 5.9*  ALBUMIN 2.3*    CBC:  Recent Labs Lab 09/01/16 0444 09/02/16 0549 09/03/16 0616 09/04/16 0314 09/05/16 0707  WBC 12.9* 12.7* 11.4* 9.9 10.4  HGB 8.8* 8.3* 8.4* 8.5* 8.9*  HCT 26.5* 25.0* 25.7* 26.0* 27.6*  MCV 83.3 84.5 84.8 85.2 85.4  PLT 363 386 502* 522* 557*   CBG: No results for input(s): GLUCAP in the last 168 hours. Sepsis Labs:  Recent Labs Lab 09/02/16 0549 09/03/16 0616 09/04/16 0314 09/05/16 0707   WBC 12.7* 11.4* 9.9 10.4    Microbiology No results found for this or any previous visit (from the past 240 hour(s)).  Radiology: No results found.  Medications:   . ARIPiprazole  30 mg Oral Daily  . feeding supplement (NEPRO CARB STEADY)  237 mL Oral Q24H  . ferrous sulfate  325 mg Oral Q breakfast  . furosemide  20 mg Oral BID  . mouth rinse  15 mL Mouth Rinse BID  . polyethylene glycol  17 g Oral BID  . rivaroxaban  20 mg Oral Q supper   Continuous Infusions:   LOS: 14 days   [facility] Center LLCBROL,Lataunya Ruud  [facility] Pager [phone]  *Please  refer to [email or web address], password [email or web address] to get updated schedule on who will round on this patient, as hospitalists switch teams weekly. If 7PM-7AM, please contact night-coverage at [email or web address], password [email or web address] for any overnight needs.  09/05/2016, 9:42 AM

## 2016-09-06 LAB — COMPREHENSIVE METABOLIC PANEL
ALBUMIN: 2.3 g/dL — AB (ref 3.5–5.0)
ALT: 26 U/L (ref 17–63)
ANION GAP: 8 (ref 5–15)
AST: 28 U/L (ref 15–41)
Alkaline Phosphatase: 81 U/L (ref 38–126)
BILIRUBIN TOTAL: 0.7 mg/dL (ref 0.3–1.2)
BUN: 20 mg/dL (ref 6–20)
CHLORIDE: 97 mmol/L — AB (ref 101–111)
CO2: 31 mmol/L (ref 22–32)
Calcium: 9.2 mg/dL (ref 8.9–10.3)
Creatinine, Ser: 1.7 mg/dL — ABNORMAL HIGH (ref 0.61–1.24)
GFR calc Af Amer: 48 mL/min — ABNORMAL LOW (ref >60)
GFR calc non Af Amer: 41 mL/min — ABNORMAL LOW (ref >60)
GLUCOSE: 112 mg/dL — AB (ref 65–99)
POTASSIUM: 4 mmol/L (ref 3.5–5.1)
SODIUM: 136 mmol/L (ref 135–145)
TOTAL PROTEIN: 6.6 g/dL (ref 6.5–8.1)

## 2016-09-06 LAB — CBC
HEMATOCRIT: 26.6 % — AB (ref 39.0–52.0)
Hemoglobin: 8.6 g/dL — ABNORMAL LOW (ref 13.0–17.0)
MCH: 27.7 pg (ref 26.0–34.0)
MCHC: 32.3 g/dL (ref 30.0–36.0)
MCV: 85.5 fL (ref 78.0–100.0)
PLATELETS: 568 10*3/uL — AB (ref 150–400)
RBC: 3.11 MIL/uL — ABNORMAL LOW (ref 4.22–5.81)
RDW: 16.9 % — AB (ref 11.5–15.5)
WBC: 9 10*3/uL (ref 4.0–10.5)

## 2016-09-06 MED ORDER — POLYETHYLENE GLYCOL 3350 17 G PO PACK: 17.0000 g | PACK | Freq: Two times a day (BID) | ORAL | 0 refills | Status: DC

## 2016-09-06 MED ORDER — ACETAMINOPHEN 650 MG RE SUPP: 650.0000 mg | Freq: Four times a day (QID) | RECTAL | 0 refills | Status: AC | PRN

## 2016-09-06 MED ORDER — LORAZEPAM 0.5 MG PO TABS: 0.5000 mg | ORAL_TABLET | Freq: Three times a day (TID) | ORAL | 0 refills | Status: DC | PRN

## 2016-09-06 MED ORDER — SENNOSIDES-DOCUSATE SODIUM 8.6-50 MG PO TABS: 2.0000 | ORAL_TABLET | Freq: Every evening | ORAL | 1 refills | Status: DC | PRN

## 2016-09-06 MED ORDER — FUROSEMIDE 20 MG PO TABS: 20.0000 mg | ORAL_TABLET | Freq: Two times a day (BID) | ORAL | 1 refills | Status: DC

## 2016-09-06 NOTE — Care Management Important Message (Signed)
Important Message  Patient Details  Name: [name] MRN: [id number] Date of Birth: 02/10/1951   Medicare Important Message Given:  Yes    Kyla BalzarineShealy, Nieves Chapa Abena 09/06/2016, 12:11 PM

## 2016-09-06 NOTE — Progress Notes (Signed)
Occupational Therapy Treatment Patient Details Name: [name] MRN: [id number] DOB: 12/23/1950 Today's Date: 09/06/2016    History of present illness 65 y.o. 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  EMS found him lethargic, congested, and hypoxic at 88% on room air.  In the ED he was found to be hypothermic and CXR showed atelectasis vs opacity in the right base   OT comments  Focus of today's session with skilled OT was grooming and oral care task completion.  Pt. With limited participation even with tactile guidance and demonstrational cues.  Note d/c likely soon to SNF.  Will continue to follow acutely.    Follow Up Recommendations  SNF    Equipment Recommendations  None recommended by OT    Recommendations for Other Services      Precautions / Restrictions Precautions Precautions: Fall Precaution Comments: contractures/skin breakdown       Mobility Bed Mobility                  Transfers                      Balance                                   ADL Overall ADL's : Needs assistance/impaired     Grooming: Wash/dry face;Oral care;Bed level;Total assistance                                 General ADL Comments: pt. unable to follow command verbally, tactile, and demonstrational to wash face after wash cloth placed in hand.  eager for mouth swab, oral care.  opened mouth immediately without cueing.  however, unable to follow command to keep mouth open for proper oral care.  continued to bite down very hard on the swab, required physical assistance to open mouth and remove swab.      Vision                     Perception     Praxis      Cognition   Behavior During Therapy: WFL for tasks assessed/performed Overall Cognitive Status: Difficult to assess                        Extremity/Trunk Assessment               Exercises     Shoulder Instructions       General Comments      Pertinent Vitals/ Pain       Pain Assessment: No/denies pain  Home Living                                          Prior Functioning/Environment              Frequency  Min 2X/week        Progress Toward Goals  OT Goals(current goals can now be found in the care plan section)  Progress towards OT goals: Progressing toward goals     Plan Discharge plan remains appropriate    Co-evaluation  End of Session Equipment Utilized During Treatment: Oxygen   Activity Tolerance     Patient Left in bed;with call bell/phone within reach   Nurse Communication          Time: [id number] OT Time Calculation (min): 12 min  Charges: OT General Charges $OT Visit: 1 Procedure OT Treatments $Self Care/Home Management : 8-22 mins  Robet LeuMorris, Aizza Santiago Lorraine, COTA/L 09/06/2016, 11:08 AM

## 2016-09-06 NOTE — Discharge Summary (Signed)
Physician Discharge Summary  [name] MRN: [id number] DOB/AGE: 1951/05/29 65 y.o.  PCP: Colon Branch, MD   Admit date: 08/22/2016 Discharge date: 09/06/2016  Discharge Diagnoses:    Principal Problem:   Sepsis [facility]) Active Problems:   Dementia   DVT (deep venous thrombosis) (HCC)   CKD (chronic kidney disease) stage 3, GFR 30-59 ml/min   Mental retardation   Absolute anemia   Sepsis due to pneumonia (HCC)   Rhinovirus infection   Acute renal failure with acute tubular necrosis superimposed on stage 3 chronic kidney disease (HCC)   Hypotensive episode   Other specified hypothyroidism   Community acquired pneumonia   Pressure injury of skin   Thrombocytopenia (HCC)   Hematoma   Toxic metabolic encephalopathy    Follow-up recommendations Follow-up with PCP in 3-5 days , including all  additional recommended appointments as below Follow-up CBC, CMP in 3-5 days       Current Discharge Medication List    START taking these medications   Details  acetaminophen (TYLENOL) 650 MG suppository Place 1 suppository (650 mg total) rectally every 6 (six) hours as needed for mild pain (or Fever >/= 101). Qty: 12 suppository, Refills: 0    polyethylene glycol (MIRALAX / GLYCOLAX) packet Take 17 g by mouth 2 (two) times daily. Qty: 14 each, Refills: 0    senna-docusate (SENOKOT-S) 8.6-50 MG tablet Take 2 tablets by mouth at bedtime as needed for mild constipation or moderate constipation. Qty: 30 tablet, Refills: 1      CONTINUE these medications which have CHANGED   Details  furosemide (LASIX) 20 MG tablet Take 1 tablet (20 mg total) by mouth 2 (two) times daily. Qty: 30 tablet, Refills: 1    LORazepam (ATIVAN) 0.5 MG tablet Take 1 tablet (0.5 mg total) by mouth every 8 (eight) hours as needed (for agitation). Qty: 10 tablet, Refills: 0      CONTINUE these medications which have NOT CHANGED   Details  ARIPiprazole (ABILIFY) 30 MG tablet Take 30 mg by mouth daily.     Cholecalciferol (VITAMIN D3) 1000 UNITS CAPS Take 1 capsule by mouth 2 (two) times daily.      donepezil (ARICEPT) 10 MG tablet Take 10 mg by mouth at bedtime.     ferrous sulfate 325 (65 FE) MG tablet Take 325 mg by mouth daily with breakfast.      gabapentin (NEURONTIN) 100 MG capsule Take 100 mg by mouth at bedtime.      memantine (NAMENDA) 10 MG tablet Take 10 mg by mouth 2 (two) times daily.      potassium chloride (K-DUR,KLOR-CON) 10 MEQ tablet Take 10 mEq by mouth daily.    !! risperiDONE (RISPERDAL) 1 MG tablet Take 1 mg by mouth at bedtime. Reported on 10/25/2015    !! risperiDONE (RISPERDAL) 2 MG tablet Take 2 mg by mouth daily with breakfast.    rivaroxaban (XARELTO) 20 MG TABS tablet Take 1 tablet (20 mg total) by mouth daily with supper. Qty: 30 tablet     !! - Potential duplicate medications found. Please discuss with provider.       Discharge Condition: Stable   Discharge Instructions Get Medicines reviewed and adjusted: Please take all your medications with you for your next visit with your Primary MD  Please request your Primary MD to go over all hospital tests and procedure/radiological results at the follow up, please ask your Primary MD to get all Hospital records sent to his/her office.  If you experience  worsening of your admission symptoms, develop shortness of breath, life threatening emergency, suicidal or homicidal thoughts you must seek medical attention immediately by calling 911 or calling your MD immediately if symptoms less severe.  You must read complete instructions/literature along with all the possible adverse reactions/side effects for all the Medicines you take and that have been prescribed to you. Take any new Medicines after you have completely understood and accpet all the possible adverse reactions/side effects.   Do not drive when taking Pain medications.   Do not take more than prescribed Pain, Sleep and Anxiety  Medications  Special Instructions: If you have smoked or chewed Tobacco in the last 2 yrs please stop smoking, stop any regular Alcohol and or any Recreational drug use.  Wear Seat belts while driving.  Please note  You were cared for by a hospitalist during your hospital stay. Once you are discharged, your primary care physician will handle any further medical issues. Please note that NO REFILLS for any discharge medications will be authorized once you are discharged, as it is imperative that you return to your primary care physician (or establish a relationship with a primary care physician if you do not have one) for your aftercare needs so that they can reassess your need for medications and monitor your lab values.     No Known Allergies    Disposition: 01-Home or Self Care   Consults:  Critical care     Significant Diagnostic Studies:  Dg Chest Port 1 View  Result Date: 08/27/2016 CLINICAL DATA:  Shortness of breath EXAM: PORTABLE CHEST 1 VIEW COMPARISON:  08/26/2016 and multiple previous FINDINGS: Bilateral pulmonary infiltrates persist, similar to yesterday's study but improved compared to the study of 11/13. No worsening or new finding. No visible effusion. IMPRESSION: Persistent bilateral pulmonary infiltrates, unchanged since yesterday. Electronically Signed   By: Paulina Fusi M.D.   On: 08/27/2016 09:50   Dg Chest Port 1 View  Result Date: 08/26/2016 CLINICAL DATA:  Pneumonia EXAM: PORTABLE CHEST 1 VIEW COMPARISON:  08/23/2016 FINDINGS: Diffuse bilateral airspace disease has improved in the interval. Bibasilar atelectasis also improved. Right pleural effusion improved. Findings most consistent with clearing heart failure or edema. Gas shadow overlying the right heart was not present on prior studies and is of uncertain etiology. This could be in the esophagus or hiatal hernia however is not seen on prior studies. If the patient has had acute changes symptoms, consider  chest CT to determine the localization of this gas. IMPRESSION: Interval improvement in bilateral airspace disease. Improvement in bibasilar atelectasis and right effusion Gas shadow overlying the right heart border of uncertain etiology. Follow-up recommended. CT if the patient has had acute interval change. Electronically Signed   By: Marlan Palau M.D.   On: 08/26/2016 09:17   Dg Chest Port 1 View  Result Date: 08/23/2016 CLINICAL DATA:  Pneumonia EXAM: PORTABLE CHEST 1 VIEW COMPARISON:  08/22/2016 FINDINGS: Extensive bilateral predominately central and basilar airspace disease is worse. Right pleural effusion is suspected as the right hemidiaphragm is obscured. Heart is prominent. No pneumothorax. IMPRESSION: Worsening bilateral airspace disease. Right pleural effusion is suspected. Electronically Signed   By: Jolaine Click M.D.   On: 08/23/2016 07:23   Dg Chest Portable 1 View  Result Date: 08/22/2016 CLINICAL DATA:  Sepsis. EXAM: PORTABLE CHEST 1 VIEW COMPARISON:  12/09/2015 FINDINGS: Shallow inspiration. Atelectasis in the lung bases. Normal heart size and pulmonary vascularity. No developing consolidation. No blunting of costophrenic angles. No pneumothorax. Calcified  and tortuous aorta. IMPRESSION: Shallow inspiration with persistent atelectasis in the lung bases similar previous study. No developing consolidation. Electronically Signed   By: Burman Nieves M.D.   On: 08/22/2016 01:43   Dg Swallowing Func-speech Pathology  Result Date: 08/29/2016 Objective Swallowing Evaluation: Type of Study: MBS-Modified Barium Swallow Study Patient Details Name: Emileo Semel MRN: [id number] Date of Birth: 19-Oct-1950 Today's Date: 08/29/2016 Time: SLP Start Time (ACUTE ONLY): 1015-SLP Stop Time (ACUTE ONLY): 1047 SLP Time Calculation (min) (ACUTE ONLY): 32 min Past Medical History: Past Medical History: Diagnosis Date . Dementia 06/19/2014 . DVT (deep venous thrombosis) (HCC)   right leg . Dyslipidemia  .  Hypertension  . Mental retardation  . Obesity  . Other and unspecified hyperlipidemia 06/19/2014 Past Surgical History: No past surgical history on file. HPI: 65 y.o.BM PMHxSevere Mental Handicap(nonverbal at baseline), HTN, DVT on Xarelto, CKD, Mild Chronic Diastolic CHF. Who presented with cough and severe weakness. EMS found him lethargic, congested, and hypoxic at 88% on room air. In the ED he was found to be hypothermic and CXR showed atelectasis vs opacity in the right base. He has been admitted twice in the past year under similar circumstances. The first time in January he had mild AKI, and improved with fluids. The second time in March he was hypothermic but without any focal signs of infection and his TSH, cortisol, and lactate were normal as was a flu swab. He was given fluids and he returned completely to normal with just supportive care. Clinically remains in a precarious situation with persisting hypotension and intermittent severe hypoxemia, continue conservative medical care as per Dr. Sharon Seller discussion with his power of attorney, not a candidate for BiPAP given his heavy secretions and his inability to control them on his own. Physiotherapy vest QID.  Subjective: alert, min verbal interaction Assessment / Plan / Recommendation CHL IP CLINICAL IMPRESSIONS 08/29/2016 Therapy Diagnosis Mild pharyngeal phase dysphagia Clinical Impression Patient presents with a functional oropharyngeal swallow. Swallow initiation mildly delayed as a result of decreased coordination of breath and swallow, which with thin liquids leads to intermittent flash penetration. Oral manipulation of bolus and pharyngeal strength WFL. Although no frank penetration or aspiration noted, recommend initiation of conservative diet to maximize energy conservation and decrease aspiration risk in the setting of respiratory dysfunction as patient with frequent increase in WOB with any activity. Will f/u for tolerance at bedside as  well as potential to advance diet with improved respiratory stability. Pateint should not require a f/u MBS to advance diet.   Impact on safety and function Moderate aspiration risk   CHL IP TREATMENT RECOMMENDATION 08/29/2016 Treatment Recommendations Therapy as outlined in treatment plan below   Prognosis 08/29/2016 Prognosis for Safe Diet Advancement Good Barriers to Reach Goals Cognitive deficits Barriers/Prognosis Comment -- CHL IP DIET RECOMMENDATION 08/29/2016 SLP Diet Recommendations Dysphagia 2 (Fine chop) solids;Nectar thick liquid Liquid Administration via Cup;Straw Medication Administration Whole meds with puree Compensations Slow rate;Small sips/bites Postural Changes Seated upright at 90 degrees   CHL IP OTHER RECOMMENDATIONS 08/29/2016 Recommended Consults -- Oral Care Recommendations Oral care BID Other Recommendations Order thickener from pharmacy;Prohibited food (jello, ice cream, thin soups);Remove water pitcher   CHL IP FOLLOW UP RECOMMENDATIONS 08/29/2016 Follow up Recommendations None   CHL IP FREQUENCY AND DURATION 08/29/2016 Speech Therapy Frequency (ACUTE ONLY) min 2x/week Treatment Duration 2 weeks      CHL IP ORAL PHASE 08/29/2016 Oral Phase WFL Oral - Pudding Teaspoon -- Oral - Pudding Cup -- Oral -  Honey Teaspoon -- Oral - Honey Cup -- Oral - Nectar Teaspoon -- Oral - Nectar Cup -- Oral - Nectar Straw -- Oral - Thin Teaspoon -- Oral - Thin Cup -- Oral - Thin Straw -- Oral - Puree -- Oral - Mech Soft -- Oral - Regular -- Oral - Multi-Consistency -- Oral - Pill -- Oral Phase - Comment --  CHL IP PHARYNGEAL PHASE 08/29/2016 Pharyngeal Phase Impaired Pharyngeal- Pudding Teaspoon -- Pharyngeal -- Pharyngeal- Pudding Cup -- Pharyngeal -- Pharyngeal- Honey Teaspoon -- Pharyngeal -- Pharyngeal- Honey Cup -- Pharyngeal -- Pharyngeal- Nectar Teaspoon Delayed swallow initiation-vallecula Pharyngeal -- Pharyngeal- Nectar Cup -- Pharyngeal -- Pharyngeal- Nectar Straw Delayed swallow  initiation-vallecula Pharyngeal -- Pharyngeal- Thin Teaspoon Delayed swallow initiation-vallecula Pharyngeal -- Pharyngeal- Thin Cup Delayed swallow initiation-pyriform sinuses;Penetration/Aspiration before swallow Pharyngeal Material enters airway, remains ABOVE vocal cords then ejected out Pharyngeal- Thin Straw Delayed swallow initiation-pyriform sinuses;Penetration/Aspiration before swallow Pharyngeal Material enters airway, remains ABOVE vocal cords then ejected out Pharyngeal- Puree Delayed swallow initiation-vallecula Pharyngeal -- Pharyngeal- Mechanical Soft Delayed swallow initiation-vallecula Pharyngeal -- Pharyngeal- Regular -- Pharyngeal -- Pharyngeal- Multi-consistency -- Pharyngeal -- Pharyngeal- Pill -- Pharyngeal -- Pharyngeal Comment --  CHL IP CERVICAL ESOPHAGEAL PHASE 08/29/2016 Cervical Esophageal Phase WFL Pudding Teaspoon -- Pudding Cup -- Honey Teaspoon -- Honey Cup -- Nectar Teaspoon -- Nectar Cup -- Nectar Straw -- Thin Teaspoon -- Thin Cup -- Thin Straw -- Puree -- Mechanical Soft -- Regular -- Multi-consistency -- Pill -- Cervical Esophageal Comment -- Ferdinand Lango MA, CCC-SLP [phone] McCoy Leah Meryl 08/29/2016, 11:01 AM                   Filed Weights   08/22/16 0550  Weight: 91.2 kg (201 lb 1 oz)     Microbiology: No results found for this or any previous visit (from the past 240 hour(s)).     Blood Culture    Component Value Date/Time   SDES TRACHEAL ASPIRATE 08/22/2016 1504   SPECREQUEST NONE 08/22/2016 1504   CULT Consistent with normal respiratory flora. 08/22/2016 1504   REPTSTATUS 08/24/2016 FINAL 08/22/2016 1504      Labs: Results for orders placed or performed during the hospital encounter of 08/22/16 (from the past 48 hour(s))  CBC     Status: Abnormal   Collection Time: 09/05/16  7:07 AM  Result Value Ref Range   WBC 10.4 4.0 - 10.5 K/uL   RBC 3.23 (L) 4.22 - 5.81 MIL/uL   Hemoglobin 8.9 (L) 13.0 - 17.0 g/dL   HCT 09.8 (L) 11.9 - 14.7 %    MCV 85.4 78.0 - 100.0 fL   MCH 27.6 26.0 - 34.0 pg   MCHC 32.2 30.0 - 36.0 g/dL   RDW 82.9 (H) 56.2 - 13.0 %   Platelets 557 (H) 150 - 400 K/uL      Lab Results  Component Value Date   CREATININE 1.83 (H) 09/02/2016     HPI :  65 y.o. male with a PMH of severe developmental delay who is nonverbal at baseline, hypertension, DVT on Xarelto, chronic diastolic CHF, and stage III chronic kidney disease who was admitted 08/22/16 for evaluation of lethargy, congestion and hypoxia. He subsequently developed septic shock in the setting of pneumococcal pneumonia and a positive rhinovirus nasal swab. He has been hypoxic and is not a candidate for BiPAP due to his heavy secretions.  Hospital course complicated by inability to pass swallow test but eventually did pass on 11/20, diet was advanced and pt's  medication changed from Heparin to Xarelto. PT eval done and determined that pt needs to be discharged to SNF for continuation of physical therapy but since [facility], passar to be obtained prior to discharge per LCSQ.  HOSPITAL COURSE:     Sepsis (HCC)/Septic shock due to pneumococcal pneumonia and rhinovirus pneumonitis - has been weaned to nasal cannula, Stable around 3 L. Not a candidate for BiPAP secondary to heavy secretions and altered mental status - Continue physiotherapy vest 4 times a day,  adamantly refused all RT therapies   - Weaned off stress dose steroids. Seen by speech therapist and is at high risk for aspiration, now on dysphagia 3 diet  - Continue nebulizer treatments as needed - antibiotics were administered between 11/11>11/19. Currently respiratory status is stable Completed Maxipime through 08/29/16.  Continue bronchodilators,     Awaiting placement to SNF  Constipation Continue MiraLAX and start patient on Dulcolax suppository Had a BM 11/25     Dysphagia - passed swallow eval, diet  Diet recommendations: Dysphagia  3 (mechanical soft);Thin liquid    Hypernatremia - resolved with IVF  follow sodium closely     DVT (deep venous thrombosis) (HCC) - On Xarelto as an outpatient. Resumed Xarelto per pharmacy  11/21    Mental retardation/dementia with superimposed toxic/metabolic encephalopathy - SNF recommended status post PT evaluation,LCSW continues to await for Level 2 passar. Continue Abilify     Chronic diastolic CHF - last ECHO in 12/2015 with grade I diastolic CHF,LV EF: 55% - 60% - stopped IVF as pt now able to take PO Continue  lasix     Absolute anemia, baseline hg 11.0 - No current indication for transfusion - no signs of active bleeding  hg now 8.4>8.9    Acute renal failure with acute tubular necrosis superimposed on stage 3 chronic kidney disease (HCC) - Baseline creatinine appears to be around 1.6-1.8 Follow BMP intermittently     Other specified hypothyroidism - TSH 5.3, and free T4 0.81. Not on replacement therapy.    Pressure injury of skin, stage II, left buttock - Evaluated by wound care RN. Pressure reduction mattress per nursing.  - Continue soft silicone foam for insulation, protection, and management of drainage.    Hematoma right posterior thigh - Hg stable, up from 8.8 --> 9.1 --> 9.5>8.9 - no signs of active bleeding      Thrombocytopenia-resolved  - suspect secondary to infection Platelets now around 557   Discharge Exam:   Blood pressure 128/85, pulse 100, temperature 98 F (36.7 C), temperature source Oral, resp. rate 18, height 6' (1.829 m), weight 91.2 kg (201 lb 1 oz), SpO2 98 %.  General exam: No acute distress, alert and follows commands.  Respiratory system: Respiratory effort normal. Mild rhonhci bibasilar  Cardiovascular system: S1 & S2 heard, RRR. No JVD,  rubs, gallops or clicks. No murmurs. Gastrointestinal system: Abdomen is nondistended, soft and nontender. No organomegaly or masses felt. Normal bowel sounds heard. Central  nervous system: Disoriented. No focal neurological deficits. Extremities: No clubbing,  or cyanosis. 1+ edema.    Follow-up Information    Colon BranchQURESHI, AYYAZ, MD. Schedule an appointment as soon as possible for a visit.   Specialty:  Internal Medicine Why:  Hospital follow-up. Follow-up with PCP in 3-5 days. Repeat CBC and CMP. Contact information: [address] [place] [id number] [phone]           Signed: Richarda OverlieBROL,Shawny Borkowski 09/06/2016,  8:44 AM        Time spent >45 mins

## 2016-09-07 LAB — CBC
HEMATOCRIT: 25.1 % — AB (ref 39.0–52.0)
HEMOGLOBIN: 8.2 g/dL — AB (ref 13.0–17.0)
MCH: 27.9 pg (ref 26.0–34.0)
MCHC: 32.7 g/dL (ref 30.0–36.0)
MCV: 85.4 fL (ref 78.0–100.0)
Platelets: 566 10*3/uL — ABNORMAL HIGH (ref 150–400)
RBC: 2.94 MIL/uL — ABNORMAL LOW (ref 4.22–5.81)
RDW: 16.8 % — AB (ref 11.5–15.5)
WBC: 9 10*3/uL (ref 4.0–10.5)

## 2016-09-07 NOTE — Progress Notes (Addendum)
No new concerns, patient stable for dc when bed available  No change in dc summ from 11/27

## 2016-09-07 NOTE — Discharge Summary (Signed)
Physician Discharge Summary  [name] MRN: [id number] DOB/AGE: January 26, 1951 65 y.o.  PCP: [name] Mccreedy, MD   Admit date: 08/22/2016 Discharge date: 09/07/2016  Discharge Diagnoses:    Principal Problem:   Sepsis [facility]) Active Problems:   Dementia   DVT (deep venous thrombosis) (HCC)   CKD (chronic kidney disease) stage 3, GFR 30-59 ml/min   Mental retardation   Absolute anemia   Sepsis due to pneumonia (HCC)   Rhinovirus infection   Acute renal failure with acute tubular necrosis superimposed on stage 3 chronic kidney disease (HCC)   Hypotensive episode   Other specified hypothyroidism   Community acquired pneumonia   Pressure injury of skin   Thrombocytopenia (HCC)   Hematoma   Toxic metabolic encephalopathy    Follow-up recommendations Follow-up with PCP in 3-5 days , including all  additional recommended appointments as below Follow-up CBC, CMP in 3-5 days       Current Discharge Medication List    START taking these medications   Details  acetaminophen (TYLENOL) 650 MG suppository Place 1 suppository (650 mg total) rectally every 6 (six) hours as needed for mild pain (or Fever >/= 101). Qty: 12 suppository, Refills: 0    polyethylene glycol (MIRALAX / GLYCOLAX) packet Take 17 g by mouth 2 (two) times daily. Qty: 14 each, Refills: 0    senna-docusate (SENOKOT-S) 8.6-50 MG tablet Take 2 tablets by mouth at bedtime as needed for mild constipation or moderate constipation. Qty: 30 tablet, Refills: 1      CONTINUE these medications which have CHANGED   Details  furosemide (LASIX) 20 MG tablet Take 1 tablet (20 mg total) by mouth 2 (two) times daily. Qty: 30 tablet, Refills: 1    LORazepam (ATIVAN) 0.5 MG tablet Take 1 tablet (0.5 mg total) by mouth every 8 (eight) hours as needed (for agitation). Qty: 10 tablet, Refills: 0      CONTINUE these medications which have NOT CHANGED   Details  ARIPiprazole (ABILIFY) 30 MG tablet Take 30 mg by mouth daily.     Cholecalciferol (VITAMIN D3) 1000 UNITS CAPS Take 1 capsule by mouth 2 (two) times daily.      donepezil (ARICEPT) 10 MG tablet Take 10 mg by mouth at bedtime.     ferrous sulfate 325 (65 FE) MG tablet Take 325 mg by mouth daily with breakfast.      gabapentin (NEURONTIN) 100 MG capsule Take 100 mg by mouth at bedtime.      memantine (NAMENDA) 10 MG tablet Take 10 mg by mouth 2 (two) times daily.      potassium chloride (K-DUR,KLOR-CON) 10 MEQ tablet Take 10 mEq by mouth daily.    !! risperiDONE (RISPERDAL) 1 MG tablet Take 1 mg by mouth at bedtime. Reported on 10/25/2015    !! risperiDONE (RISPERDAL) 2 MG tablet Take 2 mg by mouth daily with breakfast.    rivaroxaban (XARELTO) 20 MG TABS tablet Take 1 tablet (20 mg total) by mouth daily with supper. Qty: 30 tablet     !! - Potential duplicate medications found. Please discuss with provider.       Discharge Condition: Stable   Discharge Instructions Get Medicines reviewed and adjusted: Please take all your medications with you for your next visit with your Primary MD  Please request your Primary MD to go over all hospital tests and procedure/radiological results at the follow up, please ask your Primary MD to get all Hospital records sent to his/her office.  If you experience  worsening of your admission symptoms, develop shortness of breath, life threatening emergency, suicidal or homicidal thoughts you must seek medical attention immediately by calling 911 or calling your MD immediately if symptoms less severe.  You must read complete instructions/literature along with all the possible adverse reactions/side effects for all the Medicines you take and that have been prescribed to you. Take any new Medicines after you have completely understood and accpet all the possible adverse reactions/side effects.   Do not drive when taking Pain medications.   Do not take more than prescribed Pain, Sleep and Anxiety  Medications  Special Instructions: If you have smoked or chewed Tobacco in the last 2 yrs please stop smoking, stop any regular Alcohol and or any Recreational drug use.  Wear Seat belts while driving.  Please note  You were cared for by a hospitalist during your hospital stay. Once you are discharged, your primary care physician will handle any further medical issues. Please note that NO REFILLS for any discharge medications will be authorized once you are discharged, as it is imperative that you return to your primary care physician (or establish a relationship with a primary care physician if you do not have one) for your aftercare needs so that they can reassess your need for medications and monitor your lab values.  Discharge Instructions    Diet - low sodium heart healthy    Complete by:  As directed    Diet - low sodium heart healthy    Complete by:  As directed    Increase activity slowly    Complete by:  As directed    Increase activity slowly    Complete by:  As directed        No Known Allergies    Disposition: 01-Home or Self Care   Consults:  Critical care     Significant Diagnostic Studies:  Dg Chest Port 1 View  Result Date: 08/27/2016 CLINICAL DATA:  Shortness of breath EXAM: PORTABLE CHEST 1 VIEW COMPARISON:  08/26/2016 and multiple previous FINDINGS: Bilateral pulmonary infiltrates persist, similar to yesterday's study but improved compared to the study of 11/13. No worsening or new finding. No visible effusion. IMPRESSION: Persistent bilateral pulmonary infiltrates, unchanged since yesterday. Electronically Signed   By: Nelson Chimes M.D.   On: 08/27/2016 09:50   Dg Chest Port 1 View  Result Date: 08/26/2016 CLINICAL DATA:  Pneumonia EXAM: PORTABLE CHEST 1 VIEW COMPARISON:  08/23/2016 FINDINGS: Diffuse bilateral airspace disease has improved in the interval. Bibasilar atelectasis also improved. Right pleural effusion improved. Findings most consistent  with clearing heart failure or edema. Gas shadow overlying the right heart was not present on prior studies and is of uncertain etiology. This could be in the esophagus or hiatal hernia however is not seen on prior studies. If the patient has had acute changes symptoms, consider chest CT to determine the localization of this gas. IMPRESSION: Interval improvement in bilateral airspace disease. Improvement in bibasilar atelectasis and right effusion Gas shadow overlying the right heart border of uncertain etiology. Follow-up recommended. CT if the patient has had acute interval change. Electronically Signed   By: Franchot Gallo M.D.   On: 08/26/2016 09:17   Dg Chest Port 1 View  Result Date: 08/23/2016 CLINICAL DATA:  Pneumonia EXAM: PORTABLE CHEST 1 VIEW COMPARISON:  08/22/2016 FINDINGS: Extensive bilateral predominately central and basilar airspace disease is worse. Right pleural effusion is suspected as the right hemidiaphragm is obscured. Heart is prominent. No pneumothorax. IMPRESSION: Worsening bilateral airspace  disease. Right pleural effusion is suspected. Electronically Signed   By: Marybelle Killings M.D.   On: 08/23/2016 07:23   Dg Chest Portable 1 View  Result Date: 08/22/2016 CLINICAL DATA:  Sepsis. EXAM: PORTABLE CHEST 1 VIEW COMPARISON:  12/09/2015 FINDINGS: Shallow inspiration. Atelectasis in the lung bases. Normal heart size and pulmonary vascularity. No developing consolidation. No blunting of costophrenic angles. No pneumothorax. Calcified and tortuous aorta. IMPRESSION: Shallow inspiration with persistent atelectasis in the lung bases similar previous study. No developing consolidation. Electronically Signed   By: Lucienne Capers M.D.   On: 08/22/2016 01:43   Dg Swallowing Func-speech Pathology  Result Date: 08/29/2016 Objective Swallowing Evaluation: Type of Study: MBS-Modified Barium Swallow Study Patient Details Name: [name] MRN: [id number] Date of Birth: [birth year] Today's Date:  08/29/2016 Time: SLP Start Time (ACUTE ONLY): 1015-SLP Stop Time (ACUTE ONLY): 1047 SLP Time Calculation (min) (ACUTE ONLY): 32 min Past Medical History: Past Medical History: Diagnosis Date . Dementia 06/19/2014 . DVT (deep venous thrombosis) (HCC)   right leg . Dyslipidemia  . Hypertension  . Mental retardation  . Obesity  . Other and unspecified hyperlipidemia 06/19/2014 Past Surgical History: No past surgical history on file. HPI: 65 y.o.BM PMHxSevere Mental Handicap(nonverbal at baseline), HTN, DVT on Xarelto, CKD, Mild Chronic Diastolic CHF. Who presented with cough and severe weakness. EMS found him lethargic, congested, and hypoxic at 88% on room air. In the ED he was found to be hypothermic and CXR showed atelectasis vs opacity in the right base. He has been admitted twice in the past year under similar circumstances. The first time in January he had mild AKI, and improved with fluids. The second time in March he was hypothermic but without any focal signs of infection and his TSH, cortisol, and lactate were normal as was a flu swab. He was given fluids and he returned completely to normal with just supportive care. Clinically remains in a precarious situation with persisting hypotension and intermittent severe hypoxemia, continue conservative medical care as per Dr. Thereasa Solo discussion with his power of attorney, not a candidate for BiPAP given his heavy secretions and his inability to control them on his own. Physiotherapy vest QID.  Subjective: alert, min verbal interaction Assessment / Plan / Recommendation CHL IP CLINICAL IMPRESSIONS 08/29/2016 Therapy Diagnosis Mild pharyngeal phase dysphagia Clinical Impression Patient presents with a functional oropharyngeal swallow. Swallow initiation mildly delayed as a result of decreased coordination of breath and swallow, which with thin liquids leads to intermittent flash penetration. Oral manipulation of bolus and pharyngeal strength WFL. Although no frank  penetration or aspiration noted, recommend initiation of conservative diet to maximize energy conservation and decrease aspiration risk in the setting of respiratory dysfunction as patient with frequent increase in WOB with any activity. Will f/u for tolerance at bedside as well as potential to advance diet with improved respiratory stability. Pateint should not require a f/u MBS to advance diet.   Impact on safety and function Moderate aspiration risk   CHL IP TREATMENT RECOMMENDATION 08/29/2016 Treatment Recommendations Therapy as outlined in treatment plan below   Prognosis 08/29/2016 Prognosis for Safe Diet Advancement Good Barriers to Reach Goals Cognitive deficits Barriers/Prognosis Comment -- CHL IP DIET RECOMMENDATION 08/29/2016 SLP Diet Recommendations Dysphagia 2 (Fine chop) solids;Nectar thick liquid Liquid Administration via Cup;Straw Medication Administration Whole meds with puree Compensations Slow rate;Small sips/bites Postural Changes Seated upright at 90 degrees   CHL IP OTHER RECOMMENDATIONS 08/29/2016 Recommended Consults -- Oral Care Recommendations Oral care BID Other Recommendations  Order thickener from pharmacy;Prohibited food (jello, ice cream, thin soups);Remove water pitcher   CHL IP FOLLOW UP RECOMMENDATIONS 08/29/2016 Follow up Recommendations None   CHL IP FREQUENCY AND DURATION 08/29/2016 Speech Therapy Frequency (ACUTE ONLY) min 2x/week Treatment Duration 2 weeks      CHL IP ORAL PHASE 08/29/2016 Oral Phase WFL Oral - Pudding Teaspoon -- Oral - Pudding Cup -- Oral - Honey Teaspoon -- Oral - Honey Cup -- Oral - Nectar Teaspoon -- Oral - Nectar Cup -- Oral - Nectar Straw -- Oral - Thin Teaspoon -- Oral - Thin Cup -- Oral - Thin Straw -- Oral - Puree -- Oral - Mech Soft -- Oral - Regular -- Oral - Multi-Consistency -- Oral - Pill -- Oral Phase - Comment --  CHL IP PHARYNGEAL PHASE 08/29/2016 Pharyngeal Phase Impaired Pharyngeal- Pudding Teaspoon -- Pharyngeal -- Pharyngeal- Pudding Cup --  Pharyngeal -- Pharyngeal- Honey Teaspoon -- Pharyngeal -- Pharyngeal- Honey Cup -- Pharyngeal -- Pharyngeal- Nectar Teaspoon Delayed swallow initiation-vallecula Pharyngeal -- Pharyngeal- Nectar Cup -- Pharyngeal -- Pharyngeal- Nectar Straw Delayed swallow initiation-vallecula Pharyngeal -- Pharyngeal- Thin Teaspoon Delayed swallow initiation-vallecula Pharyngeal -- Pharyngeal- Thin Cup Delayed swallow initiation-pyriform sinuses;Penetration/Aspiration before swallow Pharyngeal Material enters airway, remains ABOVE vocal cords then ejected out Pharyngeal- Thin Straw Delayed swallow initiation-pyriform sinuses;Penetration/Aspiration before swallow Pharyngeal Material enters airway, remains ABOVE vocal cords then ejected out Pharyngeal- Puree Delayed swallow initiation-vallecula Pharyngeal -- Pharyngeal- Mechanical Soft Delayed swallow initiation-vallecula Pharyngeal -- Pharyngeal- Regular -- Pharyngeal -- Pharyngeal- Multi-consistency -- Pharyngeal -- Pharyngeal- Pill -- Pharyngeal -- Pharyngeal Comment --  CHL IP CERVICAL ESOPHAGEAL PHASE 08/29/2016 Cervical Esophageal Phase WFL Pudding Teaspoon -- Pudding Cup -- Honey Teaspoon -- Honey Cup -- Nectar Teaspoon -- Nectar Cup -- Nectar Straw -- Thin Teaspoon -- Thin Cup -- Thin Straw -- Puree -- Mechanical Soft -- Regular -- Multi-consistency -- Pill -- Cervical Esophageal Comment -- Gabriel Rainwater MA, CCC-SLP [phone] [place] Leah Meryl 08/29/2016, 11:01 AM                   Filed Weights   08/22/16 0550  Weight: 91.2 kg (201 lb 1 oz)     Microbiology: No results found for this or any previous visit (from the past 240 hour(s)).     Blood Culture    Component Value Date/Time   SDES TRACHEAL ASPIRATE 08/22/2016 1504   SPECREQUEST NONE 08/22/2016 1504   CULT Consistent with normal respiratory flora. 08/22/2016 1504   REPTSTATUS 08/24/2016 FINAL 08/22/2016 1504      Labs: Results for orders placed or performed during the hospital encounter of  08/22/16 (from the past 48 hour(s))  CBC     Status: Abnormal   Collection Time: 09/06/16  8:59 AM  Result Value Ref Range   WBC 9.0 4.0 - 10.5 K/uL   RBC 3.11 (L) 4.22 - 5.81 MIL/uL   Hemoglobin 8.6 (L) 13.0 - 17.0 g/dL   HCT 26.6 (L) 39.0 - 52.0 %   MCV 85.5 78.0 - 100.0 fL   MCH 27.7 26.0 - 34.0 pg   MCHC 32.3 30.0 - 36.0 g/dL   RDW 16.9 (H) 11.5 - 15.5 %   Platelets 568 (H) 150 - 400 K/uL  Comprehensive metabolic panel     Status: Abnormal   Collection Time: 09/06/16  8:59 AM  Result Value Ref Range   Sodium 136 135 - 145 mmol/L   Potassium 4.0 3.5 - 5.1 mmol/L   Chloride 97 (L) 101 - 111 mmol/L  CO2 31 22 - 32 mmol/L   Glucose, Bld 112 (H) 65 - 99 mg/dL   BUN 20 6 - 20 mg/dL   Creatinine, Ser 1.70 (H) 0.61 - 1.24 mg/dL   Calcium 9.2 8.9 - 10.3 mg/dL   Total Protein 6.6 6.5 - 8.1 g/dL   Albumin 2.3 (L) 3.5 - 5.0 g/dL   AST 28 15 - 41 U/L   ALT 26 17 - 63 U/L   Alkaline Phosphatase 81 38 - 126 U/L   Total Bilirubin 0.7 0.3 - 1.2 mg/dL   GFR calc non Af Amer 41 (L) >60 mL/min   GFR calc Af Amer 48 (L) >60 mL/min    Comment: (NOTE) The eGFR has been calculated using the CKD EPI equation. This calculation has not been validated in all clinical situations. eGFR's persistently <60 mL/min signify possible Chronic Kidney Disease.    Anion gap 8 5 - 15  CBC     Status: Abnormal   Collection Time: 09/07/16  3:17 AM  Result Value Ref Range   WBC 9.0 4.0 - 10.5 K/uL   RBC 2.94 (L) 4.22 - 5.81 MIL/uL   Hemoglobin 8.2 (L) 13.0 - 17.0 g/dL   HCT 25.1 (L) 39.0 - 52.0 %   MCV 85.4 78.0 - 100.0 fL   MCH 27.9 26.0 - 34.0 pg   MCHC 32.7 30.0 - 36.0 g/dL   RDW 16.8 (H) 11.5 - 15.5 %   Platelets 566 (H) 150 - 400 K/uL      Lab Results  Component Value Date   CREATININE 1.70 (H) 09/06/2016     HPI :  65 y.o. male with a PMH of severe developmental delay who is nonverbal at baseline, hypertension, DVT on Xarelto, chronic diastolic CHF, and stage III chronic kidney disease  who was admitted 08/22/16 for evaluation of lethargy, congestion and hypoxia. He subsequently developed septic shock in the setting of pneumococcal pneumonia and a positive rhinovirus nasal swab. He has been hypoxic and is not a candidate for BiPAP due to his heavy secretions.  Hospital course complicated by inability to pass swallow test but eventually did pass on 11/20, diet was advanced and pt's medication changed from Heparin to Xarelto. PT eval done and determined that pt needs to be discharged to SNF for continuation of physical therapy but since patient is a new placement at [facility], passar to be obtained prior to discharge per LCSQ.  HOSPITAL COURSE:     Sepsis (HCC)/Septic shock due to pneumococcal pneumonia and rhinovirus pneumonitis - has been weaned to nasal cannula, Stable around 3 L. Not a candidate for BiPAP secondary to heavy secretions and altered mental status - Continue physiotherapy vest 4 times a day,  adamantly refused all RT therapies   - Weaned off stress dose steroids. Seen by speech therapist and is at high risk for aspiration, now on dysphagia 3 diet  - Continue nebulizer treatments as needed - antibiotics were administered between 11/11>11/19. Currently respiratory status is stable Completed Maxipime through 08/29/16.  Continue bronchodilators,     Awaiting placement to SNF  Constipation Continue MiraLAX and start patient on Dulcolax suppository Had a BM 11/25     Dysphagia - passed swallow eval, diet  Diet recommendations: Dysphagia 3 (mechanical soft);Thin liquid    Hypernatremia - resolved with IVF  follow sodium closely     DVT (deep venous thrombosis) ([place]) - On Xarelto as an outpatient. Resumed Xarelto per pharmacy  11/21  Mental retardation/dementia with superimposed toxic/metabolic encephalopathy - SNF recommended status post PT evaluation,LCSW continues to await for Level 2 passar. Continue Abilify     Chronic  diastolic CHF - last ECHO in 12/2015 with grade I diastolic CHF,LV EF: 19% - 60% - stopped IVF as pt now able to take PO Continue  lasix     Absolute anemia, baseline hg 11.0 - No current indication for transfusion - no signs of active bleeding  hg now 8.4>8.9    Acute renal failure with acute tubular necrosis superimposed on stage 3 chronic kidney disease (HCC) - Baseline creatinine appears to be around 1.6-1.8 Follow BMP intermittently     Other specified hypothyroidism - TSH 5.3, and free T4 0.81. Not on replacement therapy.    Pressure injury of skin, stage II, left buttock - Evaluated by wound care RN. Pressure reduction mattress per nursing.  - Continue soft silicone foam for insulation, protection, and management of drainage.    Hematoma right posterior thigh - Hg stable, up from 8.8 --> 9.1 --> 9.5>8.9 - no signs of active bleeding      Thrombocytopenia-resolved  - suspect secondary to infection Platelets now around 557   Discharge Exam:   Blood pressure 102/66, pulse 98, temperature 98.6 F (37 C), temperature source Oral, resp. rate 18, height 6' (1.829 m), weight 91.2 kg (201 lb 1 oz), SpO2 93 %.  General exam: No acute distress, alert and follows commands.  Respiratory system: Respiratory effort normal. Mild rhonhci bibasilar  Cardiovascular system: S1 & S2 heard, RRR. No JVD,  rubs, gallops or clicks. No murmurs. Gastrointestinal system: Abdomen is nondistended, soft and nontender. No organomegaly or masses felt. Normal bowel sounds heard. Central nervous system: Disoriented. No focal neurological deficits. Extremities: No clubbing,  or cyanosis. 1+ edema.    Follow-up Information    [name] Mccreedy, MD. Schedule an appointment as soon as possible for a visit.   Specialty:  Internal Medicine Why:  Hospital follow-up. Follow-up with PCP in 3-5 days. Repeat CBC and CMP. Contact information: [place] [address] [phone]            Signed: Reyne Dumas 09/07/2016, 3:50 PM        Time spent >45 mins

## 2016-09-07 NOTE — Clinical Social Work Note (Signed)
CSW contacted [place] MUST who stated that patient's information was sent to the state this morning so there is no PASARR determination yet.  Charlynn CourtSarah Mayu Ronk, CSW [phone]

## 2016-09-07 NOTE — Progress Notes (Signed)
Attempted to place O2 [place] on patient. Pt refuses. Pt states, "Get out of here!"

## 2016-09-07 NOTE — Clinical Social Work Placement (Signed)
   CLINICAL SOCIAL WORK PLACEMENT  NOTE  Date:  09/07/2016  Patient Details  Name: [name] MRN: [id number] Date of Birth: 10/25/1950  Clinical Social Work is seeking post-discharge placement for this patient at the Skilled  Nursing Facility level of care (*CSW will initial, date and re-position this form in  chart as items are completed):  Yes   Patient/family provided with [facility] Clinical Social Work Department's list of facilities offering this level of care within the geographic area requested by the patient (or if unable, by the patient's family).  Yes   Patient/family informed of their freedom to choose among providers that offer the needed level of care, that participate in Medicare, Medicaid or managed care program needed by the patient, have an available bed and are willing to accept the patient.  Yes   Patient/family informed of [facility]'s ownership interest in [facility] and [facility], as well as of the fact that they are under no obligation to receive care at these facilities.  PASRR submitted to EDS on 08/26/16     PASRR number received on 09/07/16     Existing PASRR number confirmed on       FL2 transmitted to all facilities in geographic area requested by pt/family on 08/26/16     FL2 transmitted to all facilities within larger geographic area on       Patient informed that his/her managed care company has contracts with or will negotiate with certain facilities, including the following:        Yes   Patient/family informed of bed offers received.  [facility]     Physician recommends and patient chooses bed at      [facility] 09[facility]07/16.  Patient to be transferred to facility by [facility]     Patient family notified on 09/07/16 of transfer.  Name of family member notified:  Johnsie CancelErnestine Howell (Left voicemail with contact  information)     PHYSICIAN       Additional Comment:    _______________________________________________ Margarito LinerSarah C Shenique Childers, LCSW 09/07/2016, 4:13 PM

## 2016-09-07 NOTE — Clinical Social Work Note (Addendum)
CSW facilitated patient discharge including contacting patient family (Left two hipaa compliant voicemails for legal guardian. Told her that if she cannot call back before 4:30 pm, to call the nurse and she will give her the information CSW calling about. She is already aware of plan.) and facility to confirm patient discharge plans. Clinical information faxed to facility and family agreeable with plan. CS[facility] 5[facility]. RN to call report prior to discharge [phone]).  CSW will sign off for now as social work intervention is no longer needed. Please consult us again if new needs arise.  Charlynn CourtSarah Aniyha Tate, CSW [phone]  4:17 pm Received call back from patient's legal guardian, Ms. Howell. CSW notified her to discharge plan for today.  Charlynn CourtSarah Quanell Loughney, CSW [phone]

## 2016-09-07 NOTE — Progress Notes (Addendum)
Physical Therapy Treatment Patient Details Name: [name] MRN: [id number] DOB: 03/02/1951 Today's Date: 09/07/2016    History of Present Illness 65 y.o. 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  EMS found him lethargic, congested, and hypoxic at 88% on room air.  In the ED he was found to be hypothermic and CXR showed atelectasis vs opacity in the right base    PT Comments    Pt transferred to chair today vis stand pivot s/p 3 trials, and was excited. Pt verbalized, "chair" and "yes" Pt remains non-verbal majority of time with minimal command follow. Acute PT to con't to follow.  Follow Up Recommendations  SNF     Equipment Recommendations  None recommended by PT    Recommendations for Other Services       Precautions / Restrictions Precautions Precautions: Fall Precaution Comments: contractures/skin breakdown Restrictions Weight Bearing Restrictions: No    Mobility  Bed Mobility Overal bed mobility: Needs Assistance;+2 for physical assistance Bed Mobility: Supine to Sit     Supine to sit: Max assist;+2 for physical assistance     General bed mobility comments: pt did not initiate and relied on therapy  Transfers Overall transfer level: Needs assistance Equipment used:  (2 person lift with gait belt and pad) Transfers: Sit to/from [employer];Stand Pivot Transfers Sit to Stand: Total assist;+2 physical assistance Stand pivot transfers: +2 physical assistance;Total assist       General transfer comment: required 3 trials.pt assisted on third trial and was then able to get to chair  Ambulation/Gait                 Stairs            Wheelchair Mobility    Modified Rankin (Stroke Patients Only)       Balance Overall balance assessment: Needs assistance Sitting-balance support: Feet supported Sitting balance-Leahy Scale:  Fair Sitting balance - Comments: Min guard sitting EOB     Standing balance-Leahy Scale: Zero                      Cognition Arousal/Alertness: Awake/alert Behavior During Therapy: Flat affect Overall Cognitive Status: Difficult to assess (pt non-verbal mostly but did say a few words)                      Exercises      General Comments        Pertinent Vitals/Pain Pain Assessment: No/denies pain    Home Living Family/patient expects to be discharged to:: Skilled nursing facility               Additional Comments: [facility], which has closed     Prior Function Level of Independence: Needs assistance          PT Goals (current goals can now be found in the care plan section) Progress towards PT goals: Progressing toward goals    Frequency    Min 2X/week      PT Plan Current plan remains appropriate    Co-evaluation             End of Session Equipment Utilized During Treatment: Gait belt;Oxygen Activity Tolerance: Patient tolerated treatment well;Patient limited by lethargy Patient left: in chair;with call bell/phone within reach;with chair alarm set     Time: 4332-95181015-1035 PT Time Calculation (min) (ACUTE ONLY): 20  min  Charges:  $Therapeutic Activity: 8-22 mins                    G Codes:      Travontae Freiberger M Kuulei Kleier 09/07/2016, 2:16 Iona HansenM   Lewis ShockAshly Terrance Usery, PT, DPT Pager #: [phone] Office #: [phone]

## 2016-09-07 NOTE — Clinical Social Work Note (Addendum)
[facility]AS[facility] number obtained: [id number] F. [facility] can [facility] today. CSW paged MD to notify and am waiting on updated discharge summary with today's date.  Charlynn CourtSarah Loise Esguerra, CSW  [phone]  3:54 pm CSW called and left voicemail for patient's legal guardian.  Charlynn CourtSarah Birl Lobello, CSW [phone]

## 2016-09-08 NOTE — Progress Notes (Signed)
Pt. ready for d/c; PTAR attendants moved pt. from bed to stretcher to transport [facility]. No problems noted.

## 2016-09-09 ENCOUNTER — Non-Acute Institutional Stay (SKILLED_NURSING_FACILITY): Payer: Medicare Other | Admitting: Adult Health

## 2016-09-09 ENCOUNTER — Encounter: Payer: Self-pay | Admitting: Adult Health

## 2016-09-09 DIAGNOSIS — I82501 Chronic embolism and thrombosis of unspecified deep veins of right lower extremity: Secondary | ICD-10-CM

## 2016-09-09 DIAGNOSIS — I5032 Chronic diastolic (congestive) heart failure: Secondary | ICD-10-CM | POA: Diagnosis not present

## 2016-09-09 DIAGNOSIS — F0150 Vascular dementia without behavioral disturbance: Secondary | ICD-10-CM

## 2016-09-09 DIAGNOSIS — N183 Chronic kidney disease, stage 3 unspecified: Secondary | ICD-10-CM

## 2016-09-09 DIAGNOSIS — J189 Pneumonia, unspecified organism: Secondary | ICD-10-CM

## 2016-09-09 DIAGNOSIS — R1314 Dysphagia, pharyngoesophageal phase: Secondary | ICD-10-CM | POA: Diagnosis not present

## 2016-09-09 DIAGNOSIS — G8929 Other chronic pain: Secondary | ICD-10-CM | POA: Diagnosis not present

## 2016-09-09 DIAGNOSIS — A419 Sepsis, unspecified organism: Secondary | ICD-10-CM

## 2016-09-09 DIAGNOSIS — K5901 Slow transit constipation: Secondary | ICD-10-CM

## 2016-09-09 DIAGNOSIS — F333 Major depressive disorder, recurrent, severe with psychotic symptoms: Secondary | ICD-10-CM | POA: Diagnosis not present

## 2016-09-09 NOTE — Progress Notes (Signed)
Patient ID: [name], male   DOB: 09/21/1951, 65 y.o.   MRN: [id number]   Location:   [facility] Room Number: 158-A Place of Service:  SNF (31)   CODE STATUS: DNR  No Known Allergies  Chief Complaint  Patient presents with  . Hospitalization Follow-up    Hospital Follow up    HPI:  He has been hospitalized for sepsis and septic shock from pneumonia. He is unable to participate in the hpi or ros. He is here for short term rehab; more than likely this does represent a long term placement for him.   Past Medical History:  Diagnosis Date  . Dementia 06/19/2014  . DVT (deep venous thrombosis) (HCC)    right leg  . Dyslipidemia   . Hypertension   . Mental retardation   . Obesity   . Other and unspecified hyperlipidemia 06/19/2014    History reviewed. No pertinent surgical history.  Social History   Social History  . Marital status: Single    Spouse name: N/A  . Number of children: N/A  . Years of education: N/A   Occupational History  . Not on file.   Social History Main Topics  . Smoking status: Never Smoker  . Smokeless tobacco: Never Used  . Alcohol use No  . Drug use: No  . Sexual activity: Not on file   Other Topics Concern  . Not on file   Social History Narrative  . No narrative on file   Family History  Problem Relation Age of Onset  . Hypertension        VITAL SIGNS BP 115/76   Pulse (!) 116   Temp 99.3 F (37.4 C) (Oral)   Resp 20   Ht 5\' 11"  (1.803 m)   Wt 217 lb (98.4 kg)   SpO2 97%   BMI 30.27 kg/m   Patient's Medications  New Prescriptions   No medications on file  Previous Medications   ACETAMINOPHEN (TYLENOL) 650 MG SUPPOSITORY    Place 1 suppository (650 mg total) rectally every 6 (six) hours as needed for mild pain (or Fever >/= 101).   ARIPIPRAZOLE (ABILIFY) 30 MG TABLET    Take 30 mg by mouth daily.   CHOLECALCIFEROL (VITAMIN D3) 1000 UNITS CAPS    Take 1 capsule by mouth 2 (two) times daily.     DONEPEZIL  (ARICEPT) 10 MG TABLET    Take 10 mg by mouth at bedtime.    FUROSEMIDE (LASIX) 20 MG TABLET    Take 1 tablet (20 mg total) by mouth 2 (two) times daily.   GABAPENTIN (NEURONTIN) 100 MG CAPSULE    Take 100 mg by mouth at bedtime.     LORAZEPAM (ATIVAN) 0.5 MG TABLET    Take 1 tablet (0.5 mg total) by mouth every 8 (eight) hours as needed (for agitation).   MEMANTINE (NAMENDA) 10 MG TABLET    Take 10 mg by mouth 2 (two) times daily.     POLYETHYLENE GLYCOL (MIRALAX / GLYCOLAX) PACKET    Take 17 g by mouth 2 (two) times daily.   POTASSIUM CHLORIDE (K-DUR,KLOR-CON) 10 MEQ TABLET    Take 10 mEq by mouth daily.   RISPERIDONE (RISPERDAL) 1 MG TABLET    Take 1 mg by mouth at bedtime. Reported on 10/25/2015   RISPERIDONE (RISPERDAL) 2 MG TABLET    Take 2 mg by mouth daily with breakfast.   RIVAROXABAN (XARELTO) 20 MG TABS TABLET    Take 1 tablet (  20 mg total) by mouth daily with supper.   SENNA-DOCUSATE (SENOKOT-S) 8.6-50 MG TABLET    Take 2 tablets by mouth at bedtime as needed for mild constipation or moderate constipation.  Modified Medications   No medications on file  Discontinued Medications   FERROUS SULFATE 325 (65 FE) MG TABLET    Take 325 mg by mouth daily with breakfast.       SIGNIFICANT DIAGNOSTIC EXAMS  08-22-16: chest x-ray: Shallow inspiration with persistent atelectasis in the lung bases similar previous study. No developing consolidation.  08-26-16: chest x-ray: Interval improvement in bilateral airspace disease. Improvement in bibasilar atelectasis and right effusion Gas shadow overlying the right heart border of uncertain etiology. Follow-up recommended. CT if the patient has had acute interval change.  08-29-16: swallow study: Dysphagia 2 (Fine chop) solids;Nectar thick liquid   LABS REVIEWED:   08-22-16: wbc 5.8; hgb 11.1; hct 34.0; mcv 84.6; plt 114; glucose 123; bun 44; creat 1.68; k+ 4.7; na++ 144; liver normal albumin 3.3; tsh 5.377; blood culture: no growth; urine  culture: no growth.  08-23-16: wbc 17.3; hgb 9.9; hct 30.1; mcv 84.8; plt 93; glucose 79; bun 47; creat 2.57; k+ 5.4; na++ 147; liver normal albumin 2.4; free T4: 0.81; HIV: nr 09-06-16: glucose 112; bun 20; creat 1.70; k+ 4.0; na++ 136; liver normal albumin 2.3  09-07-16: wbc 9.0; hgb 8.2; hct 25.1; mcv 85.4; plt 566    Review of Systems  Unable to perform ROS: Dementia     Physical Exam  Constitutional: No distress.  Eyes: Conjunctivae are normal.  Neck: Neck supple. No JVD present. No thyromegaly present.  Cardiovascular: Normal rate, regular rhythm and intact distal pulses.   Respiratory: Effort normal. No respiratory distress. He has no wheezes.  Few scatter rales   GI: Soft. Bowel sounds are normal. He exhibits no distension. There is no tenderness.  Musculoskeletal: He exhibits edema.  1+ lower extremity edema  Does not hold up head.   Lymphadenopathy:    He has no cervical adenopathy.  Neurological:  Not aware   Skin: Skin is warm and dry. He is not diaphoretic.  Psychiatric: He has a normal mood and affect.     ASSESSMENT/ PLAN:  1. Diastolic heart failure: will continue lasix 20 mg twice daily with k+ 10 meq daily will monitor  2. DVT: is on long term xarelto 20 mg daily therapy will monitor   3.  Stage III chronic kidney disease: bun 20/creat 1.70  4. Dementia: will continue aricept 10 mg daily and namenda 10 mg twice daily   5. Anemia: hgb 8.2; will monitor  6. Constipation: will continue miralax twice daily and senna s 2 tabs nightly as needed  7. Depression with psychosis: will continue abilify 30 mg daily risperdal 1 mg nightly and 2 mg in the AM. Will continue ativan 0.5 mg every 8 hours as needed  8. Chronic pain: will continue neurontin 100 mg nightly   9. Dysphagia: will continue nectar thick liquids  10. Sepsis: has completed abt; will continue to monitor his status.     Will check cbc; cmp    Time spent with patient 50   minutes >50%  time spent counseling; reviewing medical record; tests; labs; and developing future plan of care    MD is aware of resident's narcotic use and is in agreement with current plan of care. We will attempt to wean resident as apropriate   [name] Maura NP [facility]  Contact [phone] Monday  through Friday 8am- 5pm  After hours call [phone]

## 2016-09-13 ENCOUNTER — Encounter: Payer: Self-pay | Admitting: Adult Health

## 2016-09-13 NOTE — Progress Notes (Signed)
Patient ID: [name], male   DOB: 05/26/1951, 65 y.o.   MRN: [id number]    Location:   [facility] Room Number: 158-A Place of Service:  SNF (31)   CODE STATUS: DNR  No Known Allergies  Chief Complaint  Patient presents with  . Acute Visit    Fever    HPI:    Past Medical History:  Diagnosis Date  . Dementia 06/19/2014  . DVT (deep venous thrombosis) (HCC)    right leg  . Dyslipidemia   . Hypertension   . Mental retardation   . Obesity   . Other and unspecified hyperlipidemia 06/19/2014    History reviewed. No pertinent surgical history.  Social History   Social History  . Marital status: Single    Spouse name: N/A  . Number of children: N/A  . Years of education: N/A   Occupational History  . Not on file.   Social History Main Topics  . Smoking status: Never Smoker  . Smokeless tobacco: Never Used  . Alcohol use No  . Drug use: No  . Sexual activity: Not on file   Other Topics Concern  . Not on file   Social History Narrative  . No narrative on file   Family History  Problem Relation Age of Onset  . Hypertension        VITAL SIGNS BP 100/63   Pulse 87   Temp (!) 101.8 F (38.8 C) (Oral)   Resp 17   Ht 5\' 11"  (1.803 m)   Wt 217 lb (98.4 kg)   SpO2 97%   BMI 30.27 kg/m   Patient's Medications  New Prescriptions   No medications on file  Previous Medications   ACETAMINOPHEN (TYLENOL) 650 MG SUPPOSITORY    Place 1 suppository (650 mg total) rectally every 6 (six) hours as needed for mild pain (or Fever >/= 101).   ARIPIPRAZOLE (ABILIFY) 30 MG TABLET    Take 30 mg by mouth daily.   CHOLECALCIFEROL (VITAMIN D3) 1000 UNITS CAPS    Take 1 capsule by mouth 2 (two) times daily.     DONEPEZIL (ARICEPT) 10 MG TABLET    Take 10 mg by mouth at bedtime.    FUROSEMIDE (LASIX) 20 MG TABLET    Take 1 tablet (20 mg total) by mouth 2 (two) times daily.   GABAPENTIN (NEURONTIN) 100 MG CAPSULE    Take 100 mg by mouth at bedtime.     LORAZEPAM (ATIVAN) 0.5 MG TABLET    Take 1 tablet (0.5 mg total) by mouth every 8 (eight) hours as needed (for agitation).   MEMANTINE (NAMENDA) 10 MG TABLET    Take 10 mg by mouth 2 (two) times daily.     POLYETHYLENE GLYCOL (MIRALAX / GLYCOLAX) PACKET    Take 17 g by mouth 2 (two) times daily.   POTASSIUM CHLORIDE (K-DUR,KLOR-CON) 10 MEQ TABLET    Take 10 mEq by mouth daily.   RISPERIDONE (RISPERDAL) 1 MG TABLET    Take 1 mg by mouth at bedtime. Reported on 10/25/2015   RISPERIDONE (RISPERDAL) 2 MG TABLET    Take 2 mg by mouth daily with breakfast.   RIVAROXABAN (XARELTO) 20 MG TABS TABLET    Take 1 tablet (20 mg total) by mouth daily with supper.   SENNA-DOCUSATE (SENOKOT-S) 8.6-50 MG TABLET    Take 2 tablets by mouth at bedtime as needed for mild constipation or moderate constipation.  Modified Medications   No medications on file  Discontinued Medications   No medications on file     SIGNIFICANT DIAGNOSTIC EXAMS       ASSESSMENT/ PLAN:    MD is aware of resident's narcotic use and is in agreement with current plan of care. We will attempt to wean resident as apropriate   Synthia Innocenteborah Green NP [facility]  Contact [phone] Monday through Friday 8am- 5pm  After hours call [phone]

## 2016-09-14 ENCOUNTER — Non-Acute Institutional Stay (SKILLED_NURSING_FACILITY): Payer: Medicare Other | Admitting: Internal Medicine

## 2016-09-14 ENCOUNTER — Encounter: Payer: Self-pay | Admitting: Internal Medicine

## 2016-09-14 DIAGNOSIS — R627 Adult failure to thrive: Secondary | ICD-10-CM

## 2016-09-14 DIAGNOSIS — I5032 Chronic diastolic (congestive) heart failure: Secondary | ICD-10-CM

## 2016-09-14 DIAGNOSIS — I82501 Chronic embolism and thrombosis of unspecified deep veins of right lower extremity: Secondary | ICD-10-CM

## 2016-09-14 DIAGNOSIS — D500 Iron deficiency anemia secondary to blood loss (chronic): Secondary | ICD-10-CM

## 2016-09-14 DIAGNOSIS — R5381 Other malaise: Secondary | ICD-10-CM | POA: Diagnosis not present

## 2016-09-14 DIAGNOSIS — N183 Chronic kidney disease, stage 3 unspecified: Secondary | ICD-10-CM

## 2016-09-14 DIAGNOSIS — E860 Dehydration: Secondary | ICD-10-CM | POA: Diagnosis not present

## 2016-09-14 DIAGNOSIS — I10 Essential (primary) hypertension: Secondary | ICD-10-CM

## 2016-09-14 DIAGNOSIS — F0150 Vascular dementia without behavioral disturbance: Secondary | ICD-10-CM | POA: Diagnosis not present

## 2016-09-14 DIAGNOSIS — J181 Lobar pneumonia, unspecified organism: Secondary | ICD-10-CM | POA: Diagnosis not present

## 2016-09-14 DIAGNOSIS — J189 Pneumonia, unspecified organism: Secondary | ICD-10-CM

## 2016-09-14 DIAGNOSIS — E782 Mixed hyperlipidemia: Secondary | ICD-10-CM | POA: Diagnosis not present

## 2016-09-14 NOTE — Progress Notes (Signed)
Patient ID: [name], male   DOB: 06/11/1951, 65 y.o.   MRN: [id number]    HISTORY AND PHYSICAL   DATE: 09/14/2016  Location[facility]    [facility] Room Number: 158 A Place of Service: SNF (31)   Extended Emergency Contact Information Secondary Emergency Contact: Howell,Ernestein  [place] Work Phone: [phone] Relation: Legal Guardian  Advanced Directive information Does Patient Have a Medical Advance Directive?: Yes, Type of Advance Directive: Out of facility DNR (pink MOST or yellow form), Pre-existing out of facility DNR order (yellow form or pink MOST form): Yellow form placed in chart (order not valid for inpatient use)  Chief Complaint  Patient presents with  . New Admit To SNF    HPI:  65 yo male seen today as a new admission into SNF following hospital stay for sepsis due to pneumococcal pneumoniae CAP, A/CKD,mentally challenged, dementia, pressure injury, toxic metabolic encephalopathy, thrombocytopenia, right posterior thigh hematoma, hypothyroidism, DVT, chronic diastolic HF. CXR revealed b/l pulmonary infiltrates and right pleural effusion.WBC 17.3K-->9K; Hgb 9.9-->8.2; Plts 93K-->566K; Cr 1.68 -->3.26-->1.7; albumin 2.3; Na peaked 151-->136 at d/c. He presents to SNF for short term rehab.  Today he reports no concerns. Nursing states pt continues to have fever with Tm today 101F. labs drawn 12/4th and reveal WBC 13.4K (prev 10.7K on 12/1st); Hgb 8.9 (prev 9.3); abs neutrophils 9.8K (prev 7.8K);PLts 455K; Cr 2.19 (prev 1.96). CXR shows cardiomegaly but no new infiltrates, masses or effusion. He is a poor historian due to mental status. Hx obtained from chart and nursing.  HTN - stable on lasix + Kdur  Dementia/hx MR - mood stable on abilify, risperdal, gabapentin and ativan; he takes aricept/namenda for cognition.   Hx DVT - stable on xeralto. Last US done 12/2015 revealed chronic RLE DVT  Chronic diastolic HF - stable on lasix +Kdur. EF nml  in 12/2015 with mild AR  Past Medical History:  Diagnosis Date  . Dementia 06/19/2014  . DVT (deep venous thrombosis) (HCC)    right leg  . Dyslipidemia   . Hypertension   . Mental retardation   . Obesity   . Other and unspecified hyperlipidemia 06/19/2014    History reviewed. No pertinent surgical history.  Patient Care Team: Colon BranchAyyaz Qureshi, MD as PCP - General (Internal Medicine) Colon BranchAyyaz Qureshi, MD as Referring Physician (Internal Medicine)  Social History   Social History  . Marital status: Single    Spouse name: N/A  . Number of children: N/A  . Years of education: N/A   Occupational History  . Not on file.   Social History Main Topics  . Smoking status: Never Smoker  . Smokeless tobacco: Never Used  . Alcohol use No  . Drug use: No  . Sexual activity: Not on file   Other Topics Concern  . Not on file   Social History Narrative  . No narrative on file     reports that he has never smoked. He has never used smokeless tobacco. He reports that he does not drink alcohol or use drugs.  Family History  Problem Relation Age of Onset  . Hypertension     Family Status  Relation Status  . Mother Deceased  . Father Deceased  .      Immunization History  Administered Date(s) Administered  . Influenza-Unspecified 09/08/2016  . PPD Test 09/07/2016  . Pneumococcal Polysaccharide-23 12/12/2015    No Known Allergies  Medications: Patient's Medications  New Prescriptions   No medications on file  Previous Medications   ACETAMINOPHEN (TYLENOL) 650 MG SUPPOSITORY    Place 1 suppository (650 mg total) rectally every 6 (six) hours as needed for mild pain (or Fever >/= 101).   ARIPIPRAZOLE (ABILIFY) 30 MG TABLET    Take 30 mg by mouth daily.   CHOLECALCIFEROL (VITAMIN D3) 1000 UNITS CAPS    Take 1 capsule by mouth 2 (two) times daily.     DONEPEZIL (ARICEPT) 10 MG TABLET    Take 10 mg by mouth at bedtime.    FUROSEMIDE (LASIX) 20 MG TABLET    Take 1 tablet (20 mg  total) by mouth 2 (two) times daily.   GABAPENTIN (NEURONTIN) 100 MG CAPSULE    Take 100 mg by mouth at bedtime.     LORAZEPAM (ATIVAN) 0.5 MG TABLET    Take 1 tablet (0.5 mg total) by mouth every 8 (eight) hours as needed (for agitation).   MEMANTINE (NAMENDA) 10 MG TABLET    Take 10 mg by mouth 2 (two) times daily.     POLYETHYLENE GLYCOL (MIRALAX / GLYCOLAX) PACKET    Take 17 g by mouth 2 (two) times daily.   POTASSIUM CHLORIDE (K-DUR,KLOR-CON) 10 MEQ TABLET    Take 10 mEq by mouth daily.   RISPERIDONE (RISPERDAL) 1 MG TABLET    Take 1 mg by mouth at bedtime. Reported on 10/25/2015   RISPERIDONE (RISPERDAL) 2 MG TABLET    Take 2 mg by mouth daily with breakfast.   RIVAROXABAN (XARELTO) 20 MG TABS TABLET    Take 1 tablet (20 mg total) by mouth daily with supper.   SENNA-DOCUSATE (SENOKOT-S) 8.6-50 MG TABLET    Take 2 tablets by mouth at bedtime as needed for mild constipation or moderate constipation.  Modified Medications   No medications on file  Discontinued Medications   No medications on file    Review of Systems  Unable to perform ROS: Psychiatric disorder    Vitals:   09/14/16 1003  BP: 100/63  Pulse: 87  Resp: 17  Temp: 98.3 F (36.8 C)  TempSrc: Oral  SpO2: 97%  Weight: 217 lb (98.4 kg)  Height: 5\' 11"  (1.803 m)   Body mass index is 30.27 kg/m.  Physical Exam  Constitutional: He appears well-developed.  Frail appearing lying in bed in NAD  HENT:  Mouth/Throat: Oropharynx is clear and moist.  Eyes: Pupils are equal, round, and reactive to light. No scleral icterus.  Neck: Neck supple. Carotid bruit is not present. No thyromegaly present.  Cardiovascular: Normal rate, regular rhythm, normal heart sounds and intact distal pulses.  Exam reveals no gallop and no friction rub.   No murmur heard. Trace LE edema b/l with chronic venous stasis changes.no palpable cords. No calf TTP  Pulmonary/Chest: Effort normal. He has no wheezes. He has rales (b/l). He exhibits no  tenderness.  Poor inspiratory effort  Abdominal: Soft. Bowel sounds are normal. He exhibits no distension, no abdominal bruit, no pulsatile midline mass and no mass. There is no hepatomegaly. There is no tenderness. There is no rebound and no guarding.  Musculoskeletal: He exhibits edema.  Lymphadenopathy:    He has no cervical adenopathy.  Neurological: He is alert.  Skin: Skin is warm and dry. No rash noted.  Psychiatric: He has a normal mood and affect. His behavior is normal. His speech is slurred. Cognition and memory are impaired.     Labs reviewed: Admission on 08/22/2016, Discharged on 09/07/2016  No results displayed because visit has over 200  results.  CBC Latest Ref Rng & Units 09/07/2016 09/06/2016 09/05/2016  WBC 4.0 - 10.5 K/uL 9.0 9.0 10.4  Hemoglobin 13.0 - 17.0 g/dL 8.2(L) 8.6(L) 8.9(L)  Hematocrit 39.0 - 52.0 % 25.1(L) 26.6(L) 27.6(L)  Platelets 150 - 400 K/uL 566(H) 568(H) 557(H)   CMP Latest Ref Rng & Units 09/06/2016 09/02/2016 09/01/2016  Glucose 65 - 99 mg/dL 295(A112(H) 82 83  BUN 6 - 20 mg/dL 20 21(H21(H) 19  Creatinine 0.61 - 1.24 mg/dL 0.86(V1.70(H) 7.84(O1.83(H) 9.62(X1.71(H)  Sodium 135 - 145 mmol/L 136 140 139  Potassium 3.5 - 5.1 mmol/L 4.0 3.6 3.8  Chloride 101 - 111 mmol/L 97(L) 107 107  CO2 22 - 32 mmol/L 31 27 25   Calcium 8.9 - 10.3 mg/dL 9.2 5.2(W8.7(L) 4.1(L8.8(L)  Total Protein 6.5 - 8.1 g/dL 6.6 5.9(L) -  Total Bilirubin 0.3 - 1.2 mg/dL 0.7 0.6 -  Alkaline Phos 38 - 126 U/L 81 85 -  AST 15 - 41 U/L 28 72(H) -  ALT 17 - 63 U/L 26 36 -   .    Dg Chest Port 1 View  Result Date: 08/27/2016 CLINICAL DATA:  Shortness of breath EXAM: PORTABLE CHEST 1 VIEW COMPARISON:  08/26/2016 and multiple previous FINDINGS: Bilateral pulmonary infiltrates persist, similar to yesterday's study but improved compared to the study of 11/13. No worsening or new finding. No visible effusion. IMPRESSION: Persistent bilateral pulmonary infiltrates, unchanged since yesterday. Electronically Signed    By: Paulina FusiMark  Shogry M.D.   On: 08/27/2016 09:50   Dg Chest Port 1 View  Result Date: 08/26/2016 CLINICAL DATA:  Pneumonia EXAM: PORTABLE CHEST 1 VIEW COMPARISON:  08/23/2016 FINDINGS: Diffuse bilateral airspace disease has improved in the interval. Bibasilar atelectasis also improved. Right pleural effusion improved. Findings most consistent with clearing heart failure or edema. Gas shadow overlying the right heart was not present on prior studies and is of uncertain etiology. This could be in the esophagus or hiatal hernia however is not seen on prior studies. If the patient has had acute changes symptoms, consider chest CT to determine the localization of this gas. IMPRESSION: Interval improvement in bilateral airspace disease. Improvement in bibasilar atelectasis and right effusion Gas shadow overlying the right heart border of uncertain etiology. Follow-up recommended. CT if the patient has had acute interval change. Electronically Signed   By: Marlan Palauharles  Clark M.D.   On: 08/26/2016 09:17   Dg Chest Port 1 View  Result Date: 08/23/2016 CLINICAL DATA:  Pneumonia EXAM: PORTABLE CHEST 1 VIEW COMPARISON:  08/22/2016 FINDINGS: Extensive bilateral predominately central and basilar airspace disease is worse. Right pleural effusion is suspected as the right hemidiaphragm is obscured. Heart is prominent. No pneumothorax. IMPRESSION: Worsening bilateral airspace disease. Right pleural effusion is suspected. Electronically Signed   By: Jolaine ClickArthur  Hoss M.D.   On: 08/23/2016 07:23   Dg Chest Portable 1 View  Result Date: 08/22/2016 CLINICAL DATA:  Sepsis. EXAM: PORTABLE CHEST 1 VIEW COMPARISON:  12/09/2015 FINDINGS: Shallow inspiration. Atelectasis in the lung bases. Normal heart size and pulmonary vascularity. No developing consolidation. No blunting of costophrenic angles. No pneumothorax. Calcified and tortuous aorta. IMPRESSION: Shallow inspiration with persistent atelectasis in the lung bases similar previous  study. No developing consolidation. Electronically Signed   By: Burman NievesWilliam  Stevens M.D.   On: 08/22/2016 01:43   Dg Swallowing Func-speech Pathology  Result Date: 08/29/2016 Objective Swallowing Evaluation: Type of Study: MBS-Modified Barium Swallow Study Patient Details Name: [name] Bakernest Linzy MRN: [id number] Date of Birth: 10/02/1951 Today's Date: 08/29/2016 Time: SLP  Start Time (ACUTE ONLY): 1015-SLP Stop Time (ACUTE ONLY): 1047 SLP Time Calculation (min) (ACUTE ONLY): 32 min Past Medical History: Past Medical History: Diagnosis Date . Dementia 06/19/2014 . DVT (deep venous thrombosis) (HCC)   right leg . Dyslipidemia  . Hypertension  . Mental retardation  . Obesity  . Other and unspecified hyperlipidemia 06/19/2014 Past Surgical History: No past surgical history on file. HPI: 65 y.o.BM PMHxSevere Mental Handicap(nonverbal at baseline), HTN, DVT on Xarelto, CKD, Mild Chronic Diastolic CHF. Who presented with cough and severe weakness. EMS found him lethargic, congested, and hypoxic at 88% on room air. In the ED he was found to be hypothermic and CXR showed atelectasis vs opacity in the right base. He has been admitted twice in the past year under similar circumstances. The first time in January he had mild AKI, and improved with fluids. The second time in March he was hypothermic but without any focal signs of infection and his TSH, cortisol, and lactate were normal as was a flu swab. He was given fluids and he returned completely to normal with just supportive care. Clinically remains in a precarious situation with persisting hypotension and intermittent severe hypoxemia, continue conservative medical care as per Dr. Sharon Seller discussion with his power of attorney, not a candidate for BiPAP given his heavy secretions and his inability to control them on his own. Physiotherapy vest QID.  Subjective: alert, min verbal interaction Assessment / Plan / Recommendation CHL IP CLINICAL IMPRESSIONS 08/29/2016 Therapy  Diagnosis Mild pharyngeal phase dysphagia Clinical Impression Patient presents with a functional oropharyngeal swallow. Swallow initiation mildly delayed as a result of decreased coordination of breath and swallow, which with thin liquids leads to intermittent flash penetration. Oral manipulation of bolus and pharyngeal strength WFL. Although no frank penetration or aspiration noted, recommend initiation of conservative diet to maximize energy conservation and decrease aspiration risk in the setting of respiratory dysfunction as patient with frequent increase in WOB with any activity. Will f/u for tolerance at bedside as well as potential to advance diet with improved respiratory stability. Pateint should not require a f/u MBS to advance diet.   Impact on safety and function Moderate aspiration risk   CHL IP TREATMENT RECOMMENDATION 08/29/2016 Treatment Recommendations Therapy as outlined in treatment plan below   Prognosis 08/29/2016 Prognosis for Safe Diet Advancement Good Barriers to Reach Goals Cognitive deficits Barriers/Prognosis Comment -- CHL IP DIET RECOMMENDATION 08/29/2016 SLP Diet Recommendations Dysphagia 2 (Fine chop) solids;Nectar thick liquid Liquid Administration via Cup;Straw Medication Administration Whole meds with puree Compensations Slow rate;Small sips/bites Postural Changes Seated upright at 90 degrees   CHL IP OTHER RECOMMENDATIONS 08/29/2016 Recommended Consults -- Oral Care Recommendations Oral care BID Other Recommendations Order thickener from pharmacy;Prohibited food (jello, ice cream, thin soups);Remove water pitcher   CHL IP FOLLOW UP RECOMMENDATIONS 08/29/2016 Follow up Recommendations None   CHL IP FREQUENCY AND DURATION 08/29/2016 Speech Therapy Frequency (ACUTE ONLY) min 2x/week Treatment Duration 2 weeks      CHL IP ORAL PHASE 08/29/2016 Oral Phase WFL Oral - Pudding Teaspoon -- Oral - Pudding Cup -- Oral - Honey Teaspoon -- Oral - Honey Cup -- Oral - Nectar Teaspoon -- Oral -  Nectar Cup -- Oral - Nectar Straw -- Oral - Thin Teaspoon -- Oral - Thin Cup -- Oral - Thin Straw -- Oral - Puree -- Oral - Mech Soft -- Oral - Regular -- Oral - Multi-Consistency -- Oral - Pill -- Oral Phase - Comment --  CHL IP PHARYNGEAL PHASE  08/29/2016 Pharyngeal Phase Impaired Pharyngeal- Pudding Teaspoon -- Pharyngeal -- Pharyngeal- Pudding Cup -- Pharyngeal -- Pharyngeal- Honey Teaspoon -- Pharyngeal -- Pharyngeal- Honey Cup -- Pharyngeal -- Pharyngeal- Nectar Teaspoon Delayed swallow initiation-vallecula Pharyngeal -- Pharyngeal- Nectar Cup -- Pharyngeal -- Pharyngeal- Nectar Straw Delayed swallow initiation-vallecula Pharyngeal -- Pharyngeal- Thin Teaspoon Delayed swallow initiation-vallecula Pharyngeal -- Pharyngeal- Thin Cup Delayed swallow initiation-pyriform sinuses;Penetration/Aspiration before swallow Pharyngeal Material enters airway, remains ABOVE vocal cords then ejected out Pharyngeal- Thin Straw Delayed swallow initiation-pyriform sinuses;Penetration/Aspiration before swallow Pharyngeal Material enters airway, remains ABOVE vocal cords then ejected out Pharyngeal- Puree Delayed swallow initiation-vallecula Pharyngeal -- Pharyngeal- Mechanical Soft Delayed swallow initiation-vallecula Pharyngeal -- Pharyngeal- Regular -- Pharyngeal -- Pharyngeal- Multi-consistency -- Pharyngeal -- Pharyngeal- Pill -- Pharyngeal -- Pharyngeal Comment --  CHL IP CERVICAL ESOPHAGEAL PHASE 08/29/2016 Cervical Esophageal Phase WFL Pudding Teaspoon -- Pudding Cup -- Honey Teaspoon -- Honey Cup -- Nectar Teaspoon -- Nectar Cup -- Nectar Straw -- Thin Teaspoon -- Thin Cup -- Thin Straw -- Puree -- Mechanical Soft -- Regular -- Multi-consistency -- Pill -- Cervical Esophageal Comment -- Ferdinand Lango MA, CCC-SLP [phone] McCoy Leah Meryl 08/29/2016, 11:01 AM                Assessment/Plan   ICD-9-CM ICD-10-CM   1. Dehydration 276.51 E86.0   2. Physical deconditioning 799.3 R53.81   3. Community acquired  pneumonia of left lower lobe of lung (HCC) 481 J18.1   4. Vascular dementia without behavioral disturbance 290.40 F01.50   5. Chronic deep vein thrombosis (DVT) of right lower extremity, unspecified vein (HCC) 453.50 I82.501   6. Chronic diastolic CHF (congestive heart failure) (HCC) 428.32 I50.32    428.0    7. Essential hypertension 401.9 I10   8. CKD (chronic kidney disease) stage 3, GFR 30-59 ml/min 585.3 N18.3   9. Iron deficiency anemia due to chronic blood loss 280.0 D50.0   10. Mixed hyperlipidemia 272.2 E78.2   11. Failure to thrive in adult 783.7 R62.7     Push fluids  Restorative feeding  PT/OT/ST as ordered  Fall precautions  Cont current meds as ordered  GOAL: short term rehab and d/c to ALF when medically appropriate. Communicated with pt and nursing.  Will follow   Armonii Sieh S. Ancil Linsey  [facility] [address] [place], [place] [address] [phone] Cell (Monday-Friday 8 AM - 5 PM) [phone] After 5 PM and follow prompts

## 2016-09-16 ENCOUNTER — Non-Acute Institutional Stay (SKILLED_NURSING_FACILITY): Payer: Medicare Other | Admitting: Adult Health

## 2016-09-16 ENCOUNTER — Encounter: Payer: Self-pay | Admitting: Adult Health

## 2016-09-16 ENCOUNTER — Encounter (HOSPITAL_COMMUNITY): Payer: Self-pay

## 2016-09-16 ENCOUNTER — Emergency Department (HOSPITAL_COMMUNITY): Payer: Medicare Other

## 2016-09-16 ENCOUNTER — Inpatient Hospital Stay (HOSPITAL_COMMUNITY)
Admission: EM | Admit: 2016-09-16 | Discharge: 2016-09-23 | DRG: 871 | Disposition: A | Discharge status: Home / Self Care | Payer: Medicare Other | Attending: Internal Medicine | Admitting: Internal Medicine

## 2016-09-16 ENCOUNTER — Inpatient Hospital Stay (HOSPITAL_COMMUNITY): Payer: Medicare Other

## 2016-09-16 DIAGNOSIS — A419 Sepsis, unspecified organism: Secondary | ICD-10-CM | POA: Diagnosis not present

## 2016-09-16 DIAGNOSIS — Z8249 Family history of ischemic heart disease and other diseases of the circulatory system: Secondary | ICD-10-CM | POA: Diagnosis not present

## 2016-09-16 DIAGNOSIS — R410 Disorientation, unspecified: Secondary | ICD-10-CM

## 2016-09-16 DIAGNOSIS — D649 Anemia, unspecified: Secondary | ICD-10-CM | POA: Diagnosis present

## 2016-09-16 DIAGNOSIS — I82501 Chronic embolism and thrombosis of unspecified deep veins of right lower extremity: Secondary | ICD-10-CM | POA: Diagnosis present

## 2016-09-16 DIAGNOSIS — J189 Pneumonia, unspecified organism: Secondary | ICD-10-CM | POA: Diagnosis present

## 2016-09-16 DIAGNOSIS — D638 Anemia in other chronic diseases classified elsewhere: Secondary | ICD-10-CM | POA: Diagnosis present

## 2016-09-16 DIAGNOSIS — J9601 Acute respiratory failure with hypoxia: Secondary | ICD-10-CM | POA: Diagnosis present

## 2016-09-16 DIAGNOSIS — Z7409 Other reduced mobility: Secondary | ICD-10-CM

## 2016-09-16 DIAGNOSIS — I130 Hypertensive heart and chronic kidney disease with heart failure and stage 1 through stage 4 chronic kidney disease, or unspecified chronic kidney disease: Secondary | ICD-10-CM | POA: Diagnosis present

## 2016-09-16 DIAGNOSIS — R2991 Unspecified symptoms and signs involving the musculoskeletal system: Secondary | ICD-10-CM

## 2016-09-16 DIAGNOSIS — Z7901 Long term (current) use of anticoagulants: Secondary | ICD-10-CM

## 2016-09-16 DIAGNOSIS — R0902 Hypoxemia: Secondary | ICD-10-CM

## 2016-09-16 DIAGNOSIS — L89153 Pressure ulcer of sacral region, stage 3: Secondary | ICD-10-CM | POA: Diagnosis present

## 2016-09-16 DIAGNOSIS — Z66 Do not resuscitate: Secondary | ICD-10-CM | POA: Diagnosis present

## 2016-09-16 DIAGNOSIS — N183 Chronic kidney disease, stage 3 unspecified: Secondary | ICD-10-CM | POA: Diagnosis present

## 2016-09-16 DIAGNOSIS — Y95 Nosocomial condition: Secondary | ICD-10-CM | POA: Diagnosis present

## 2016-09-16 DIAGNOSIS — T148XXA Other injury of unspecified body region, initial encounter: Secondary | ICD-10-CM | POA: Diagnosis present

## 2016-09-16 DIAGNOSIS — I471 Supraventricular tachycardia: Secondary | ICD-10-CM

## 2016-09-16 DIAGNOSIS — F79 Unspecified intellectual disabilities: Secondary | ICD-10-CM | POA: Diagnosis present

## 2016-09-16 DIAGNOSIS — E860 Dehydration: Secondary | ICD-10-CM | POA: Diagnosis present

## 2016-09-16 DIAGNOSIS — E669 Obesity, unspecified: Secondary | ICD-10-CM | POA: Diagnosis present

## 2016-09-16 DIAGNOSIS — R627 Adult failure to thrive: Secondary | ICD-10-CM | POA: Diagnosis present

## 2016-09-16 DIAGNOSIS — F0390 Unspecified dementia without behavioral disturbance: Secondary | ICD-10-CM | POA: Diagnosis present

## 2016-09-16 DIAGNOSIS — Z6828 Body mass index (BMI) 28.0-28.9, adult: Secondary | ICD-10-CM

## 2016-09-16 DIAGNOSIS — I5032 Chronic diastolic (congestive) heart failure: Secondary | ICD-10-CM | POA: Diagnosis present

## 2016-09-16 DIAGNOSIS — I129 Hypertensive chronic kidney disease with stage 1 through stage 4 chronic kidney disease, or unspecified chronic kidney disease: Secondary | ICD-10-CM | POA: Diagnosis present

## 2016-09-16 DIAGNOSIS — I10 Essential (primary) hypertension: Secondary | ICD-10-CM | POA: Diagnosis present

## 2016-09-16 DIAGNOSIS — I82409 Acute embolism and thrombosis of unspecified deep veins of unspecified lower extremity: Secondary | ICD-10-CM | POA: Diagnosis present

## 2016-09-16 DIAGNOSIS — R5381 Other malaise: Secondary | ICD-10-CM

## 2016-09-16 DIAGNOSIS — F0150 Vascular dementia without behavioral disturbance: Secondary | ICD-10-CM | POA: Diagnosis not present

## 2016-09-16 DIAGNOSIS — R000 Tachycardia, unspecified: Secondary | ICD-10-CM | POA: Diagnosis present

## 2016-09-16 DIAGNOSIS — E785 Hyperlipidemia, unspecified: Secondary | ICD-10-CM | POA: Diagnosis present

## 2016-09-16 DIAGNOSIS — M6281 Muscle weakness (generalized): Secondary | ICD-10-CM

## 2016-09-16 LAB — I-STAT CG4 LACTIC ACID, ED
Lactic Acid, Venous: 0.87 mmol/L (ref 0.5–1.9)
Lactic Acid, Venous: 0.64 mmol/L (ref 0.5–1.9)

## 2016-09-16 LAB — CBC WITH DIFFERENTIAL/PLATELET
BASOS ABS: 0 10*3/uL (ref 0.0–0.1)
BASOS PCT: 0 %
EOS PCT: 3 %
Eosinophils Absolute: 0.3 10*3/uL (ref 0.0–0.7)
HEMATOCRIT: 26.4 % — AB (ref 39.0–52.0)
Hemoglobin: 8.6 g/dL — ABNORMAL LOW (ref 13.0–17.0)
Lymphocytes Relative: 19 %
Lymphs Abs: 2.2 10*3/uL (ref 0.7–4.0)
MCH: 27.8 pg (ref 26.0–34.0)
MCHC: 32.6 g/dL (ref 30.0–36.0)
MCV: 85.4 fL (ref 78.0–100.0)
Monocytes Absolute: 0.9 10*3/uL (ref 0.1–1.0)
Monocytes Relative: 8 %
NEUTROS ABS: 8 10*3/uL — AB (ref 1.7–7.7)
Neutrophils Relative %: 70 %
PLATELETS: 523 10*3/uL — AB (ref 150–400)
RBC: 3.09 MIL/uL — ABNORMAL LOW (ref 4.22–5.81)
RDW: 16.6 % — AB (ref 11.5–15.5)
WBC: 11.4 10*3/uL — ABNORMAL HIGH (ref 4.0–10.5)

## 2016-09-16 LAB — URINALYSIS, ROUTINE W REFLEX MICROSCOPIC
Bilirubin Urine: NEGATIVE
Glucose, UA: NEGATIVE mg/dL
Hgb urine dipstick: NEGATIVE
KETONES UR: NEGATIVE mg/dL
LEUKOCYTES UA: NEGATIVE
NITRITE: NEGATIVE
PROTEIN: NEGATIVE mg/dL
Specific Gravity, Urine: 1.013 (ref 1.005–1.030)
pH: 5 (ref 5.0–8.0)

## 2016-09-16 LAB — COMPREHENSIVE METABOLIC PANEL
ALBUMIN: 2.3 g/dL — AB (ref 3.5–5.0)
ALK PHOS: 96 U/L (ref 38–126)
ALT: 24 U/L (ref 17–63)
ANION GAP: 9 (ref 5–15)
AST: 28 U/L (ref 15–41)
BILIRUBIN TOTAL: 0.5 mg/dL (ref 0.3–1.2)
BUN: 30 mg/dL — ABNORMAL HIGH (ref 6–20)
CALCIUM: 9.3 mg/dL (ref 8.9–10.3)
CO2: 29 mmol/L (ref 22–32)
Chloride: 101 mmol/L (ref 101–111)
Creatinine, Ser: 1.78 mg/dL — ABNORMAL HIGH (ref 0.61–1.24)
GFR calc Af Amer: 45 mL/min — ABNORMAL LOW (ref >60)
GFR, EST NON AFRICAN AMERICAN: 39 mL/min — AB (ref >60)
GLUCOSE: 102 mg/dL — AB (ref 65–99)
Potassium: 4.5 mmol/L (ref 3.5–5.1)
Sodium: 139 mmol/L (ref 135–145)
TOTAL PROTEIN: 7.1 g/dL (ref 6.5–8.1)

## 2016-09-16 LAB — PROCALCITONIN: PROCALCITONIN: 0.19 ng/mL

## 2016-09-16 LAB — I-STAT TROPONIN, ED: TROPONIN I, POC: 0 ng/mL (ref 0.00–0.08)

## 2016-09-16 LAB — TSH: TSH: 2.218 u[IU]/mL (ref 0.350–4.500)

## 2016-09-16 MED ORDER — SODIUM CHLORIDE 0.9 % IV BOLUS (SEPSIS)
1000.0000 mL | Freq: Once | INTRAVENOUS | Status: AC
  Administered 2016-09-16: 1000 mL via INTRAVENOUS

## 2016-09-16 MED ORDER — IOPAMIDOL (ISOVUE-370) INJECTION 76%
INTRAVENOUS | Status: AC
  Administered 2016-09-16: 80 mL
  Filled 2016-09-16: qty 100

## 2016-09-16 NOTE — ED Notes (Signed)
Dr. Liu at bedside 

## 2016-09-16 NOTE — ED Triage Notes (Signed)
Pt from [facility]:  staff called out for "failure to thrive and fever for three days" Pt is tachycardic hr between 120-130's. Pt does have an IV from the facility. Pt can normally answer "yes" "no" questions and is at baseline mentation. Facility documented "98.4": axillary. Pt always wears 2 L [place], was 100% on 2 L Anderson with EMS.   Staff from [facility] were unavailable to give EMS report. EMS that brought pt to ED got report on the pt from the BLS crew that was already on scene.

## 2016-09-16 NOTE — ED Provider Notes (Signed)
[facility]-EMERGENCY DEPT Provider Note   CSN: [id number] Arrival date & time: 09/16/16  1614     History   Chief Complaint Chief Complaint  Patient presents with  . Failure To Thrive    HPI [name] is a 65 y.o. male.  HPI  65 year old male who presents from [facility] and rehab for failure to thrive. Level V caveat due to dementia and cognitive impairment. History is provided by EMS  Who states that they were called because he has had fever and failure to thrive for 3 days. On their arrival he has been tachycardic with a heart rate in the 120s to 130s. Can normally answer yes and no questions, and was reported to have baseline mental status. Chronically on 2 L nasal cannula.   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  Past Medical History:  Diagnosis Date  . Dementia 06/19/2014  . DVT (deep venous thrombosis) (HCC)    right leg  . Dyslipidemia   . Hypertension   . Mental retardation   . Obesity   . Other and unspecified hyperlipidemia 06/19/2014    Patient Active Problem List   Diagnosis Date Noted  . Tachycardia 09/16/2016  . Pressure injury of skin 08/27/2016  . Thrombocytopenia (HCC) 08/27/2016  . Hematoma 08/27/2016  . Toxic metabolic encephalopathy 08/27/2016  . Community acquired pneumonia   . Sepsis due to pneumonia (HCC)   . Rhinovirus infection   . Acute renal failure with acute tubular necrosis superimposed on stage 3 chronic kidney disease (HCC)   . Hypotensive episode   . Other specified hypothyroidism   . Sepsis (HCC) 08/22/2016  . Chronic diastolic CHF (congestive heart failure) (HCC) 12/09/2015  . Mild aortic stenosis 12/09/2015  . Failure to thrive in adult 12/09/2015  . Absolute anemia   . DVT (deep venous thrombosis) (HCC) 06/11/2015  . CKD (chronic kidney disease) stage 3, GFR 30-59 ml/min 06/11/2015  . Mental retardation 06/11/2015  . Dementia  06/19/2014  . HLD (hyperlipidemia) 06/19/2014  . Leg edema 07/09/2011  . Hypertension     History reviewed. No pertinent surgical history.     Home Medications    Prior to Admission medications   Medication Sig Start Date End Date Taking? Authorizing Provider  acetaminophen (TYLENOL) 650 MG suppository Place 1 suppository (650 mg total) rectally every 6 (six) hours as needed for mild pain (or Fever >/= 101). 09/06/16   Richarda Overlie, MD  ARIPiprazole (ABILIFY) 30 MG tablet Take 30 mg by mouth daily.    Historical Provider, MD  Cholecalciferol (VITAMIN D3) 1000 UNITS CAPS Take 1 capsule by mouth 2 (two) times daily.      Historical Provider, MD  donepezil (ARICEPT) 10 MG tablet Take 10 mg by mouth at bedtime.     Historical Provider, MD  furosemide (LASIX) 20 MG tablet Take 1 tablet (20 mg total) by mouth 2 (two) times daily. 09/06/16   Richarda Overlie, MD  gabapentin (NEURONTIN) 100 MG capsule Take 100 mg by mouth at bedtime.      Historical Provider, MD  LORazepam (ATIVAN) 0.5 MG tablet Take 1 tablet (0.5 mg total) by mouth every 8 (eight) hours as needed (for agitation). 09/06/16   Richarda Overlie, MD  memantine (NAMENDA) 10 MG tablet Take 10 mg by mouth 2 (two) times daily.      Historical Provider, MD  polyethylene glycol (MIRALAX / GLYCOLAX) packet Take 17 g by mouth  2 (two) times daily. 09/06/16   Richarda OverlieNayana Abrol, MD  potassium chloride (K-DUR,KLOR-CON) 10 MEQ tablet Take 10 mEq by mouth daily.    Historical Provider, MD  risperiDONE (RISPERDAL) 1 MG tablet Take 1 mg by mouth at bedtime. Reported on 10/25/2015    Historical Provider, MD  risperiDONE (RISPERDAL) 2 MG tablet Take 2 mg by mouth daily with breakfast.    Historical Provider, MD  rivaroxaban (XARELTO) 20 MG TABS tablet Take 1 tablet (20 mg total) by mouth daily with supper. 06/22/15   Penny Piarlando Vega, MD  senna-docusate (SENOKOT-S) 8.6-50 MG tablet Take 2 tablets by mouth at bedtime as needed for mild constipation or moderate  constipation. 09/06/16   Richarda OverlieNayana Abrol, MD    Family History Family History  Problem Relation Age of Onset  . Hypertension      Social History Social History  Substance Use Topics  . Smoking status: Never Smoker  . Smokeless tobacco: Never Used  . Alcohol use No     Allergies   Patient has no known allergies.   Review of Systems Review of Systems  Unable to perform ROS: Dementia     Physical Exam Updated Vital Signs BP 101/69   Pulse 114   Temp 99.6 F (37.6 C) (Rectal)   Resp 25   SpO2 100%   Physical Exam Physical Exam  Nursing note and vitals reviewed. Constitutional: Nonverbal, does not obey commands, in no acute distress Head: Normocephalic and atraumatic.  Mouth/Throat: Oropharynx is clear and moist.  Neck: Normal range of motion. Neck supple.  Cardiovascular: Tachycardic rate and regular rhythm.  no edema Pulmonary/Chest: Effort normal and breath sounds normal.  Abdominal: Soft. There is no tenderness. There is no rebound and no guarding.  Musculoskeletal: Normal range of motion.  Neurological: Alert, non-verbal, does not obey commands, no facial droop, fluent speech Skin: Skin is warm and dry. 10 x 10 cm stage II decubitus ulcer Psychiatric: Cooperative   ED Treatments / Results  Labs (all labs ordered are listed, but only abnormal results are displayed) Labs Reviewed  COMPREHENSIVE METABOLIC PANEL - Abnormal; Notable for the following:       Result Value   Glucose, Bld 102 (*)    BUN 30 (*)    Creatinine, Ser 1.78 (*)    Albumin 2.3 (*)    GFR calc non Af Amer 39 (*)    GFR calc Af Amer 45 (*)    All other components within normal limits  CBC WITH DIFFERENTIAL/PLATELET - Abnormal; Notable for the following:    WBC 11.4 (*)    RBC 3.09 (*)    Hemoglobin 8.6 (*)    HCT 26.4 (*)    RDW 16.6 (*)    Platelets 523 (*)    Neutro Abs 8.0 (*)    All other components within normal limits  CULTURE, BLOOD (ROUTINE X 2)  CULTURE, BLOOD (ROUTINE X  2)  URINE CULTURE  URINALYSIS, ROUTINE W REFLEX MICROSCOPIC  PROCALCITONIN  INFLUENZA PANEL BY PCR (TYPE A & B, H1N1)  TSH  I-STAT CG4 LACTIC ACID, ED  I-STAT TROPOININ, ED  I-STAT CG4 LACTIC ACID, ED    EKG  EKG Interpretation  Date/Time:  Thursday September 16 2016 16:21:04 EST Ventricular Rate:  121 PR Interval:    QRS Duration: 93 QT Interval:  308 QTC Calculation: 437 R Axis:   26 Text Interpretation:  Sinus tachycardia similar to last EKg  Confirmed by LIU MD, DANA ([id number]) on 09/16/2016 9:12:15 PM  Radiology Dg Chest Port 1 View  Result Date: 09/16/2016 CLINICAL DATA:  Fever for 3 days EXAM: PORTABLE CHEST 1 VIEW COMPARISON:  None. FINDINGS: Bilateral airspace disease has improved. There continues to be elevation of the right hemidiaphragm with bibasilar atelectasis. Normal heart size. No pneumothorax. IMPRESSION: Improved bilateral airspace disease.  Bibasilar atelectasis. Electronically Signed   By: Jolaine ClickArthur  Hoss M.D.   On: 09/16/2016 17:05    Procedures Procedures (including critical care time)  Medications Ordered in ED Medications  iopamidol (ISOVUE-370) 76 % injection (not administered)  sodium chloride 0.9 % bolus 1,000 mL (0 mLs Intravenous Stopped 09/16/16 1845)  sodium chloride 0.9 % bolus 1,000 mL (0 mLs Intravenous Stopped 09/16/16 1846)     Initial Impression / Assessment and Plan / ED Course  I have reviewed the triage vital signs and the nursing notes.  Pertinent labs & imaging results that were available during my care of the patient were reviewed by me and considered in my medical decision making (see chart for details).  Clinical Course     Presenting with failure to thrive. Afebrile in ED, persistent tachycardic with HR 110s, but normotensive. Persistent tachypneic, but no hypoxia.   Septic work-up initated, with normal lactate, WBC of 11, and no clear source of infection on UA and CXR. With decub ulcer, but does not appear grossly  infected.  Persistent tachycardia despite 2 L of IVF. Will plan on CT PE given tachypnea and tachycardia and history of DVT on Xarelto.   Discussed with Dr. Toniann FailKakrakandy. Admitted to hospitalist service pending CT PE.   Final Clinical Impressions(s) / ED Diagnoses   Final diagnoses:  Tachycardia  Failure to thrive in adult    New Prescriptions New Prescriptions   No medications on file     Lavera Guiseana Duo Liu, MD 09/16/16 2225

## 2016-09-16 NOTE — Progress Notes (Signed)
Patient ID: [name], male   DOB: 12/07/1950, 65 y.o.   MRN: [id number]   Location:   [facility] Room Number: 158-A Place of Service:  SNF (31)   CODE STATUS: DNR  No Known Allergies  Chief Complaint  Patient presents with  . Acute Visit    Altered mental Status    HPI:  He is less responsive; he tachycardic at 155; he is having periodic fevers. He will need to to go to the ED for further evaluation and treatment.   Past Medical History:  Diagnosis Date  . Dementia 06/19/2014  . DVT (deep venous thrombosis) (HCC)    right leg  . Dyslipidemia   . Hypertension   . Mental retardation   . Obesity   . Other and unspecified hyperlipidemia 06/19/2014    History reviewed. No pertinent surgical history.  Social History   Social History  . Marital status: Single    Spouse name: N/A  . Number of children: N/A  . Years of education: N/A   Occupational History  . Not on file.   Social History Main Topics  . Smoking status: Never Smoker  . Smokeless tobacco: Never Used  . Alcohol use No  . Drug use: No  . Sexual activity: Not on file   Other Topics Concern  . Not on file   Social History Narrative  . No narrative on file   Family History  Problem Relation Age of Onset  . Hypertension        VITAL SIGNS BP 114/86   Pulse (!) 155   Temp 98.2 F (36.8 C) (Oral)   Resp 20   Ht 5\' 11"  (1.803 m)   Wt 217 lb (98.4 kg)   SpO2 92%   BMI 30.27 kg/m   Patient's Medications  New Prescriptions   No medications on file  Previous Medications   ACETAMINOPHEN (TYLENOL) 650 MG SUPPOSITORY    Place 1 suppository (650 mg total) rectally every 6 (six) hours as needed for mild pain (or Fever >/= 101).   ARIPIPRAZOLE (ABILIFY) 30 MG TABLET    Take 30 mg by mouth daily.   CHOLECALCIFEROL (VITAMIN D3) 1000 UNITS CAPS    Take 1 capsule by mouth 2 (two) times daily.     DONEPEZIL (ARICEPT) 10 MG TABLET    Take 10 mg by mouth at bedtime.    FUROSEMIDE (LASIX)  20 MG TABLET    Take 1 tablet (20 mg total) by mouth 2 (two) times daily.   GABAPENTIN (NEURONTIN) 100 MG CAPSULE    Take 100 mg by mouth at bedtime.     LORAZEPAM (ATIVAN) 0.5 MG TABLET    Take 1 tablet (0.5 mg total) by mouth every 8 (eight) hours as needed (for agitation).   MEMANTINE (NAMENDA) 10 MG TABLET    Take 10 mg by mouth 2 (two) times daily.     POLYETHYLENE GLYCOL (MIRALAX / GLYCOLAX) PACKET    Take 17 g by mouth 2 (two) times daily.   POTASSIUM CHLORIDE (K-DUR,KLOR-CON) 10 MEQ TABLET    Take 10 mEq by mouth daily.   RISPERIDONE (RISPERDAL) 1 MG TABLET    Take 1 mg by mouth at bedtime. Reported on 10/25/2015   RISPERIDONE (RISPERDAL) 2 MG TABLET    Take 2 mg by mouth daily with breakfast.   RIVAROXABAN (XARELTO) 20 MG TABS TABLET    Take 1 tablet (20 mg total) by mouth daily with supper.   SENNA-DOCUSATE (SENOKOT-S) 8.6-50  MG TABLET    Take 2 tablets by mouth at bedtime as needed for mild constipation or moderate constipation.  Modified Medications   No medications on file  Discontinued Medications   No medications on file     SIGNIFICANT DIAGNOSTIC EXAMS   08-22-16: chest x-ray: Shallow inspiration with persistent atelectasis in the lung bases similar previous study. No developing consolidation.  08-26-16: chest x-ray: Interval improvement in bilateral airspace disease. Improvement in bibasilar atelectasis and right effusion Gas shadow overlying the right heart border of uncertain etiology. Follow-up recommended. CT if the patient has had acute interval change.  08-29-16: swallow study: Dysphagia 2 (Fine chop) solids;Nectar thick liquid   LABS REVIEWED:   08-22-16: wbc 5.8; hgb 11.1; hct 34.0; mcv 84.6; plt 114; glucose 123; bun 44; creat 1.68; k+ 4.7; na++ 144; liver normal albumin 3.3; tsh 5.377; blood culture: no growth; urine culture: no growth.  08-23-16: wbc 17.3; hgb 9.9; hct 30.1; mcv 84.8; plt 93; glucose 79; bun 47; creat 2.57; k+ 5.4; na++ 147; liver normal  albumin 2.4; free T4: 0.81; HIV: nr 09-06-16: glucose 112; bun 20; creat 1.70; k+ 4.0; na++ 136; liver normal albumin 2.3  09-07-16: wbc 9.0; hgb 8.2; hct 25.1; mcv 85.4; plt 566    Review of Systems  Unable to perform ROS: Dementia     Physical Exam  Constitutional: No distress.  Eyes: Conjunctivae are normal.  Neck: Neck supple. No JVD present. No thyromegaly present.  Cardiovascular: Normal rate, regular rhythm and intact distal pulses.   Respiratory: Effort normal. No respiratory distress. He has no wheezes.  Rales rhonchi GI: Soft. Bowel sounds are normal. He exhibits no distension. There is no tenderness.  Musculoskeletal: He exhibits edema.  1+ lower extremity edema  Does not hold up head.   Lymphadenopathy:    He has no cervical adenopathy.  Neurological:  Not aware   Skin: Skin is warm and dry. He is not diaphoretic.  Psychiatric: He has a normal mood and affect.      ASSESSMENT/ PLAN:  1. Altered mental status 2. Tachycardia  I am concerned about him having a PE. Will send to the ED for further evaluation and treatment options.   Time spent with patient  45   minutes >50% time spent counseling; reviewing medical record; tests; labs; and developing future plan of care   MD is aware of resident's narcotic use and is in agreement with current plan of care. We will attempt to wean resident as apropriate   Synthia Innocenteborah Lovie Agresta NP [facility]  Contact [phone] Monday through Friday 8am- 5pm  After hours call [phone]

## 2016-09-16 NOTE — ED Notes (Signed)
Called lab to add on TSH

## 2016-09-17 ENCOUNTER — Encounter (HOSPITAL_COMMUNITY): Payer: Self-pay | Admitting: Internal Medicine

## 2016-09-17 DIAGNOSIS — J189 Pneumonia, unspecified organism: Secondary | ICD-10-CM | POA: Diagnosis present

## 2016-09-17 LAB — BLOOD CULTURE ID PANEL (REFLEXED)
Acinetobacter baumannii: NOT DETECTED
CANDIDA GLABRATA: NOT DETECTED
CANDIDA KRUSEI: NOT DETECTED
Candida albicans: NOT DETECTED
Candida parapsilosis: NOT DETECTED
Candida tropicalis: NOT DETECTED
ENTEROBACTER CLOACAE COMPLEX: NOT DETECTED
ESCHERICHIA COLI: NOT DETECTED
Enterobacteriaceae species: NOT DETECTED
Enterococcus species: NOT DETECTED
Haemophilus influenzae: NOT DETECTED
Klebsiella oxytoca: NOT DETECTED
Klebsiella pneumoniae: NOT DETECTED
LISTERIA MONOCYTOGENES: NOT DETECTED
Neisseria meningitidis: NOT DETECTED
PROTEUS SPECIES: NOT DETECTED
PSEUDOMONAS AERUGINOSA: NOT DETECTED
STAPHYLOCOCCUS SPECIES: NOT DETECTED
STREPTOCOCCUS PNEUMONIAE: NOT DETECTED
STREPTOCOCCUS PYOGENES: NOT DETECTED
Serratia marcescens: NOT DETECTED
Staphylococcus aureus (BCID): NOT DETECTED
Streptococcus agalactiae: NOT DETECTED
Streptococcus species: NOT DETECTED

## 2016-09-17 LAB — CBC WITH DIFFERENTIAL/PLATELET
BASOS ABS: 0 10*3/uL (ref 0.0–0.1)
Basophils Relative: 0 %
Eosinophils Absolute: 0.4 10*3/uL (ref 0.0–0.7)
Eosinophils Relative: 4 %
HEMATOCRIT: 24.9 % — AB (ref 39.0–52.0)
Hemoglobin: 7.9 g/dL — ABNORMAL LOW (ref 13.0–17.0)
LYMPHS PCT: 17 %
Lymphs Abs: 1.8 10*3/uL (ref 0.7–4.0)
MCH: 27.3 pg (ref 26.0–34.0)
MCHC: 31.7 g/dL (ref 30.0–36.0)
MCV: 86.2 fL (ref 78.0–100.0)
MONO ABS: 1 10*3/uL (ref 0.1–1.0)
Monocytes Relative: 10 %
NEUTROS ABS: 7.3 10*3/uL (ref 1.7–7.7)
Neutrophils Relative %: 69 %
Platelets: 479 10*3/uL — ABNORMAL HIGH (ref 150–400)
RBC: 2.89 MIL/uL — ABNORMAL LOW (ref 4.22–5.81)
RDW: 16.6 % — AB (ref 11.5–15.5)
WBC: 10.6 10*3/uL — ABNORMAL HIGH (ref 4.0–10.5)

## 2016-09-17 LAB — COMPREHENSIVE METABOLIC PANEL
ALBUMIN: 2.1 g/dL — AB (ref 3.5–5.0)
ALT: 21 U/L (ref 17–63)
ANION GAP: 8 (ref 5–15)
AST: 21 U/L (ref 15–41)
Alkaline Phosphatase: 86 U/L (ref 38–126)
BUN: 26 mg/dL — ABNORMAL HIGH (ref 6–20)
CHLORIDE: 102 mmol/L (ref 101–111)
CO2: 29 mmol/L (ref 22–32)
Calcium: 9.1 mg/dL (ref 8.9–10.3)
Creatinine, Ser: 1.55 mg/dL — ABNORMAL HIGH (ref 0.61–1.24)
GFR calc Af Amer: 53 mL/min — ABNORMAL LOW (ref >60)
GFR calc non Af Amer: 46 mL/min — ABNORMAL LOW (ref >60)
GLUCOSE: 108 mg/dL — AB (ref 65–99)
POTASSIUM: 4.2 mmol/L (ref 3.5–5.1)
SODIUM: 139 mmol/L (ref 135–145)
TOTAL PROTEIN: 6.7 g/dL (ref 6.5–8.1)
Total Bilirubin: 0.5 mg/dL (ref 0.3–1.2)

## 2016-09-17 LAB — GLUCOSE, CAPILLARY
GLUCOSE-CAPILLARY: 108 mg/dL — AB (ref 65–99)
GLUCOSE-CAPILLARY: 118 mg/dL — AB (ref 65–99)
Glucose-Capillary: 78 mg/dL (ref 65–99)
Glucose-Capillary: 94 mg/dL (ref 65–99)

## 2016-09-17 LAB — INFLUENZA PANEL BY PCR (TYPE A & B)
INFLAPCR: NEGATIVE
Influenza B By PCR: NEGATIVE

## 2016-09-17 MED ORDER — LORAZEPAM 0.5 MG PO TABS: 0.5000 mg | ORAL_TABLET | Freq: Three times a day (TID) | ORAL | Status: DC | PRN

## 2016-09-17 MED ORDER — SENNOSIDES-DOCUSATE SODIUM 8.6-50 MG PO TABS
2.0000 | ORAL_TABLET | Freq: Every evening | ORAL | Status: DC | PRN
Start: 2016-09-17 — End: 2016-09-23

## 2016-09-17 MED ORDER — ORAL CARE MOUTH RINSE
15.0000 mL | Freq: Two times a day (BID) | OROMUCOSAL | Status: DC
  Administered 2016-09-17 – 2016-09-20 (×7): 15 mL via OROMUCOSAL

## 2016-09-17 MED ORDER — DONEPEZIL HCL 10 MG PO TABS
10.0000 mg | ORAL_TABLET | Freq: Every day | ORAL | Status: DC
  Administered 2016-09-18 – 2016-09-22 (×5): 10 mg via ORAL
  Filled 2016-09-17 (×6): qty 1

## 2016-09-17 MED ORDER — RIVAROXABAN 20 MG PO TABS
20.0000 mg | ORAL_TABLET | Freq: Every day | ORAL | Status: DC
Start: 2016-09-17 — End: 2016-09-23
  Administered 2016-09-17 – 2016-09-22 (×6): 20 mg via ORAL
  Filled 2016-09-17 (×6): qty 1

## 2016-09-17 MED ORDER — SODIUM CHLORIDE 0.9 % IV SOLN
INTRAVENOUS | Status: DC
  Administered 2016-09-17: 02:00:00 via INTRAVENOUS

## 2016-09-17 MED ORDER — POTASSIUM CHLORIDE CRYS ER 10 MEQ PO TBCR
10.0000 meq | EXTENDED_RELEASE_TABLET | Freq: Every day | ORAL | Status: DC
  Administered 2016-09-17: 10 meq via ORAL
  Filled 2016-09-17: qty 1

## 2016-09-17 MED ORDER — ACETAMINOPHEN 650 MG RE SUPP: 650.0000 mg | Freq: Four times a day (QID) | RECTAL | Status: DC | PRN

## 2016-09-17 MED ORDER — RISPERIDONE 2 MG PO TABS
2.0000 mg | ORAL_TABLET | Freq: Every day | ORAL | Status: DC
  Administered 2016-09-18 – 2016-09-23 (×6): 2 mg via ORAL
  Filled 2016-09-17 (×9): qty 1

## 2016-09-17 MED ORDER — GABAPENTIN 100 MG PO CAPS
100.0000 mg | ORAL_CAPSULE | Freq: Every day | ORAL | Status: DC
  Administered 2016-09-18 – 2016-09-22 (×5): 100 mg via ORAL
  Filled 2016-09-17 (×6): qty 1

## 2016-09-17 MED ORDER — ACETAMINOPHEN 325 MG PO TABS: 650.0000 mg | ORAL_TABLET | Freq: Four times a day (QID) | ORAL | Status: DC | PRN

## 2016-09-17 MED ORDER — DEXTROSE 5 % IV SOLN
1.0000 g | Freq: Three times a day (TID) | INTRAVENOUS | Status: DC
  Administered 2016-09-17 – 2016-09-18 (×4): 1 g via INTRAVENOUS
  Filled 2016-09-17 (×6): qty 1

## 2016-09-17 MED ORDER — ONDANSETRON HCL 4 MG PO TABS: 4.0000 mg | ORAL_TABLET | Freq: Four times a day (QID) | ORAL | Status: DC | PRN

## 2016-09-17 MED ORDER — MEMANTINE HCL 10 MG PO TABS
10.0000 mg | ORAL_TABLET | Freq: Two times a day (BID) | ORAL | Status: DC
  Administered 2016-09-17 – 2016-09-23 (×12): 10 mg via ORAL
  Filled 2016-09-17 (×13): qty 1

## 2016-09-17 MED ORDER — POLYETHYLENE GLYCOL 3350 17 G PO PACK
17.0000 g | PACK | Freq: Two times a day (BID) | ORAL | Status: DC
  Administered 2016-09-18 – 2016-09-21 (×4): 17 g via ORAL
  Filled 2016-09-17 (×6): qty 1

## 2016-09-17 MED ORDER — ENSURE ENLIVE PO LIQD
237.0000 mL | Freq: Two times a day (BID) | ORAL | Status: DC
  Administered 2016-09-18 – 2016-09-23 (×9): 237 mL via ORAL

## 2016-09-17 MED ORDER — ARIPIPRAZOLE 10 MG PO TABS
30.0000 mg | ORAL_TABLET | Freq: Every day | ORAL | Status: DC
  Administered 2016-09-17 – 2016-09-23 (×7): 30 mg via ORAL
  Filled 2016-09-17 (×7): qty 3

## 2016-09-17 MED ORDER — ONDANSETRON HCL 4 MG/2ML IJ SOLN: 4.0000 mg | Freq: Four times a day (QID) | INTRAMUSCULAR | Status: DC | PRN

## 2016-09-17 MED ORDER — PRO-STAT SUGAR FREE PO LIQD
30.0000 mL | Freq: Two times a day (BID) | ORAL | Status: DC
  Administered 2016-09-18 – 2016-09-23 (×8): 30 mL via ORAL
  Filled 2016-09-17 (×9): qty 30

## 2016-09-17 MED ORDER — RISPERIDONE 1 MG PO TABS
1.0000 mg | ORAL_TABLET | Freq: Every day | ORAL | Status: DC
  Administered 2016-09-18 – 2016-09-22 (×5): 1 mg via ORAL
  Filled 2016-09-17 (×7): qty 1

## 2016-09-17 MED ORDER — VANCOMYCIN HCL IN DEXTROSE 750-5 MG/150ML-% IV SOLN
750.0000 mg | Freq: Two times a day (BID) | INTRAVENOUS | Status: DC
  Administered 2016-09-17 – 2016-09-18 (×3): 750 mg via INTRAVENOUS
  Filled 2016-09-17 (×4): qty 150

## 2016-09-17 MED ORDER — ADULT MULTIVITAMIN W/MINERALS CH
1.0000 | ORAL_TABLET | Freq: Every day | ORAL | Status: DC
  Administered 2016-09-17 – 2016-09-23 (×7): 1 via ORAL
  Filled 2016-09-17 (×7): qty 1

## 2016-09-17 NOTE — Progress Notes (Signed)
Initial Nutrition Assessment  DOCUMENTATION CODES:   Not applicable  INTERVENTION:  Ensure Enlive po BID, each supplement provides 350 kcal and 20 grams of protein Multivitamin with minerals daily 30 ml Pro-stat BID, each dose provides 100 kcal and 15 grams of protein   NUTRITION DIAGNOSIS:   Increased nutrient needs related to wound healing as evidenced by estimated needs.   GOAL:   Patient will meet greater than or equal to 90% of their needs   MONITOR:   PO intake, Supplement acceptance, Labs, Weight trends, I & O's, Skin  REASON FOR ASSESSMENT:   Malnutrition Screening Tool    ASSESSMENT:   65 y.o. male with dementia, lower extremity DVT and hematoma, sacral wound, chronic anemia and kidney disease who was recently admitted last month for sepsis from pneumonia was brought in the ER after patient's nursing facility found that patient has not been eating well for last 3 days and was febrile.   Pt alone in room at time of visit; non responsive to RD presence. Pt has some mild muscle wasting in clavicles/acromion region, but otherwise pt appears well nourished. Per weight history, pt's weigh has been fairly stable. Per nursing notes pt ate 20% of one meal at 1100 hr today.   Labs: low hemoglobin, low albumin  Diet Order:  DIET DYS 3 Room service appropriate? Yes; Fluid consistency: Thin  Skin:  Wound (see comment) (Stage II pressure injury on L sacrum)  Last BM:  12/8  Height:   Ht Readings from Last 1 Encounters:  09/17/16 6' (1.829 m)    Weight:   Wt Readings from Last 1 Encounters:  09/17/16 207 lb 3.7 oz (94 kg)    Ideal Body Weight:  80.9 kg  BMI:  Body mass index is 28.11 kg/m.  Estimated Nutritional Needs:   Kcal:  2100-2400  Protein:  115-130 grams  Fluid:  2.1-2.4 L/day  EDUCATION NEEDS:   No education needs identified at this time  [name] RD, CSP, LDN Inpatient Clinical Dietitian Pager: [phone] After Hours Pager:  [phone]

## 2016-09-17 NOTE — Progress Notes (Signed)
Pt arrived to 2w17 via stretcher. Tele box placed, CCMD notified, verified x2. Admitting MD paged, orders given and carried out. Pt does not appear to be SOB or in pain, sats well on 2L. Will continue to monitor.

## 2016-09-17 NOTE — Evaluation (Signed)
Clinical/Bedside Swallow Evaluation Patient Details  Name: [name] MRN: [id number] Date of Birth: 08/24/1951  Today's Date: 09/17/2016 Time: SLP Start Time (ACUTE ONLY): 0843 SLP Stop Time (ACUTE ONLY): 0901 SLP Time Calculation (min) (ACUTE ONLY): 18 min  Past Medical History:  Past Medical History:  Diagnosis Date  . Dementia 06/19/2014  . DVT (deep venous thrombosis) (HCC)    right leg  . Dyslipidemia   . Hypertension   . Mental retardation   . Obesity   . Other and unspecified hyperlipidemia 06/19/2014   Past Surgical History: History reviewed. No pertinent surgical history. HPI:  [name] Martinetrnest Bergeris a 65 y.o.malewith dementia, lower extremity DVT and hematoma, sacral wound, chronic anemia and kidney disease who was recently admitted last month for sepsis from pneumonia was brought in the ER after patient's nursing facility found that patient has not been eating well for last 3 days and was febrile.  In the ER Patient was mildly febrile with leukocytosis. CT angiogram of the chest was negative for PE but does shows features concerning for pneumonia. RIGHT greater than LEFT tree-in-bud infiltrates. Heterogeneously enhancing small consolidation. Enhancing RIGHT middle lobe atelectasis. Prior MBS shows functional swallow though nectar thick liquids recommended given frequent shortness of breath with activity.    Assessment / Plan / Recommendation Clinical Impression  Pt demonstrates swallow function consistent with baseline at time of d/c last admission. Pt is alert, able to self feed, though head support is poor. Pt with a moderate oral dyspahgia secondary to lack of rotary mandibular motion, with open mouth smacking with regular textures. Despite this he manages soft solids well. SLP observed to pt have audible swallow with probable delay in some portion of pharyngeal phase, but no signs of aspiration over 8 oz of water, consumed rapidly and consecutively with a straw. Pts CT of chest is  concerning for aspiration related pna, however, objective and subjective observations do not demonstrate findings of dysphagia related aspiration. Would suspect postprandial or chronic reflux related aspiration would be more likely as pt does demonstrate some belching today and early satiety in past meals. His mentation does not allow SLP to discuss symptoms with pt. Would recommend resuming a soft solids (dys  mech soft diet) with thin liquids. Thickening liquids would be unlikely to reduce risk given lack of finding of dysphagia related aspiration and may increase risk of infection with reflux related aspiration events.  Discussed esophageal precautions with staff and posted signage reminding of precautions and need for oral care. Will follow for tolerance.     Aspiration Risk  Mild aspiration risk    Diet Recommendation Dysphagia 3 (Mech soft);Thin liquid   Liquid Administration via: Cup;Straw Medication Administration: Whole meds with puree Supervision: Staff to assist with self feeding Compensations: Slow rate;Small sips/bites;Minimize environmental distractions;Follow solids with liquid Postural Changes: Seated upright at 90 degrees;Remain upright for at least 30 minutes after po intake    Other  Recommendations Oral Care Recommendations: Oral care BID   Follow up Recommendations Skilled Nursing facility      Frequency and Duration min 2x/week  2 weeks       Prognosis Prognosis for Safe Diet Advancement: Good      Swallow Study   General HPI: [name] Martinetrnest Bergeris a 65 y.o.malewith dementia, lower extremity DVT and hematoma, sacral wound, chronic anemia and kidney disease who was recently admitted last month for sepsis from pneumonia was brought in the ER after patient's nursing facility found that patient has not been eating well for last  3 days and was febrile.  In the ER Patient was mildly febrile with leukocytosis. CT angiogram of the chest was negative for PE but does shows  features concerning for pneumonia. RIGHT greater than LEFT tree-in-bud infiltrates. Heterogeneously enhancing small consolidation. Enhancing RIGHT middle lobe atelectasis. Prior MBS shows functional swallow though nectar thick liquids recommended given frequent shortness of breath with activity.  Type of Study: Bedside Swallow Evaluation Previous Swallow Assessment: Prior BSE recommended NPO, as mentation imrpvoed pt advanced to nectar/puree under MBS though swallow WFL, then to regular thin prior to d/c.  Diet Prior to this Study: NPO Temperature Spikes Noted: No Respiratory Status: Room air History of Recent Intubation: No Behavior/Cognition: Alert;Requires cueing Oral Cavity Assessment: Dry Oral Care Completed by SLP: Yes Oral Cavity - Dentition: Poor condition Vision: Functional for self-feeding Self-Feeding Abilities: Able to feed self Patient Positioning: Postural control interferes with function (head/neck control poor) Baseline Vocal Quality: Normal Volitional Cough: Cognitively unable to elicit Volitional Swallow: Unable to elicit    Oral/Motor/Sensory Function Overall Oral Motor/Sensory Function: Other (comment) (pt did not follow commands, appears WNL with functional task)   Ice Chips Ice chips: Impaired Presentation: Spoon Pharyngeal Phase Impairments: Suspected delayed Swallow   Thin Liquid Thin Liquid: Impaired Presentation: Cup;Straw;Self Fed Pharyngeal  Phase Impairments: Suspected delayed Swallow    Nectar Thick Nectar Thick Liquid: Not tested   Honey Thick Honey Thick Liquid: Not tested   Puree Puree: Within functional limits   Solid   GO   Solid: Impaired Oral Phase Impairments: Impaired mastication Oral Phase Functional Implications: Impaired mastication       Harlon DittyBonnie Didier Brandenburg, MA CCC-SLP [phone]  Aften Lipsey, Riley NearingBonnie Caroline 09/17/2016,9:49 AM

## 2016-09-17 NOTE — Care Management Note (Signed)
Case Management Note Donn PieriniKristi Delories Mauri RN, BSN Unit 2W-Case Manager [phone]  Patient Details  Name: [name] MRN: [id number] Date of Birth: 08/24/1951  Subjective/Objective:    Pt admitted with HCAP                Action/Plan: [facility]- CSW following for return to SNF when medically stable  Expected Discharge Date:  09/19/16               Expected Discharge Plan:  Skilled Nursing Facility  In-House Referral:  Clinical Social Work  Discharge planning Services  CM Consult  Post Acute Care Choice:    Choice offered to:     DME Arranged:    DME Agency:     HH Arranged:    HH Agency:     Status of Service:  Completed, signed off  If discussed at [employer] Meetings, dates discussed:    Additional Comments:  Darrold SpanWebster, Claretha Townshend Hall, RN 09/17/2016, 3:51 PM

## 2016-09-17 NOTE — H&P (Signed)
History and Physical    [name] ONG:[id number] DOB: [birth year] DOA: 09/16/2016  PCP: Colon Branch, MD  Patient coming from: Nursing home.  Chief Complaint: Fever and not eating well.  HPI: [name] is a 65 y.o. male with dementia, lower extremity DVT and hematoma, sacral wound, chronic anemia and kidney disease who was recently admitted last month for sepsis from pneumonia was brought in the ER after patient's nursing facility found that patient has not been eating well for last 3 days and was febrile. Most of the history is obtained from ER physician and previous records since patient is not communicative. In the ER patient was found to be tachycardic with heart rate in the 120s. Blood pressure was in the low normal. Patient was mildly febrile with leukocytosis. Lactate levels and procalcitonin was not significant. Since patient was persistently tachycardic CT angiogram of the chest was done given the history of DVT. CT angiogram of the chest was negative for PE but does shows features concerning for pneumonia. Patient is being admitted for possible developing sepsis from pneumonia.   ED Course: Patient was given 2 L normal saline bolus in the ER and blood cultures sent.  Review of Systems: As per HPI, rest all negative.   Past Medical History:  Diagnosis Date  . Dementia 06/19/2014  . DVT (deep venous thrombosis) (HCC)    right leg  . Dyslipidemia   . Hypertension   . Mental retardation   . Obesity   . Other and unspecified hyperlipidemia 06/19/2014    History reviewed. No pertinent surgical history.   reports that he has never smoked. He has never used smokeless tobacco. He reports that he does not drink alcohol or use drugs.  No Known Allergies  Family History  Problem Relation Age of Onset  . Hypertension      Prior to Admission medications   Medication Sig Start Date End Date Taking? Authorizing Provider  acetaminophen (TYLENOL) 650 MG suppository Place 1  suppository (650 mg total) rectally every 6 (six) hours as needed for mild pain (or Fever >/= 101). 09/06/16  Yes Richarda Overlie, MD  ARIPiprazole (ABILIFY) 30 MG tablet Take 30 mg by mouth daily.   Yes Historical Provider, MD  Cholecalciferol (VITAMIN D3) 1000 UNITS CAPS Take 1 capsule by mouth 2 (two) times daily.     Yes Historical Provider, MD  donepezil (ARICEPT) 10 MG tablet Take 10 mg by mouth at bedtime.    Yes Historical Provider, MD  furosemide (LASIX) 20 MG tablet Take 1 tablet (20 mg total) by mouth 2 (two) times daily. 09/06/16  Yes Richarda Overlie, MD  gabapentin (NEURONTIN) 100 MG capsule Take 100 mg by mouth at bedtime.     Yes Historical Provider, MD  LORazepam (ATIVAN) 0.5 MG tablet Take 1 tablet (0.5 mg total) by mouth every 8 (eight) hours as needed (for agitation). 09/06/16  Yes Richarda Overlie, MD  polyethylene glycol (MIRALAX / GLYCOLAX) packet Take 17 g by mouth 2 (two) times daily. 09/06/16  Yes Richarda Overlie, MD  potassium chloride (K-DUR,KLOR-CON) 10 MEQ tablet Take 10 mEq by mouth daily.   Yes Historical Provider, MD  risperiDONE (RISPERDAL) 1 MG tablet Take 1 mg by mouth at bedtime. Reported on 10/25/2015   Yes Historical Provider, MD  risperiDONE (RISPERDAL) 2 MG tablet Take 2 mg by mouth daily with breakfast.   Yes Historical Provider, MD  rivaroxaban (XARELTO) 20 MG TABS tablet Take 1 tablet (20 mg total) by mouth daily with  supper. 06/22/15  Yes Penny Pia, MD  senna-docusate (SENOKOT-S) 8.6-50 MG tablet Take 2 tablets by mouth at bedtime as needed for mild constipation or moderate constipation. 09/06/16  Yes Richarda Overlie, MD  memantine (NAMENDA) 10 MG tablet Take 10 mg by mouth 2 (two) times daily.      Historical Provider, MD    Physical Exam: Vitals:   09/16/16 2230 09/16/16 2330 09/16/16 2345 09/17/16 0043  BP: 103/76 107/78 108/79 105/74  Pulse: 115 110 109 (!) 107  Resp: 23 23 20 18   Temp:    97.5 F (36.4 C)  TempSrc:    Oral  SpO2: 100% 97% 98% 98%  Height:     6' (1.829 m)      Constitutional: Moderately built and nourished. Vitals:   09/16/16 2230 09/16/16 2330 09/16/16 2345 09/17/16 0043  BP: 103/76 107/78 108/79 105/74  Pulse: 115 110 109 (!) 107  Resp: 23 23 20 18   Temp:    97.5 F (36.4 C)  TempSrc:    Oral  SpO2: 100% 97% 98% 98%  Height:    6' (1.829 m)   Eyes: Anicteric no pallor. ENMT: No discharge from the ears eyes nose and mouth. Neck: No mass felt. No neck rigidity. Respiratory: No rhonchi or crepitations. Cardiovascular: S1 and S2 heard. No murmurs appreciated. Abdomen: Soft nontender bowel sounds present. No guarding or rigidity. Musculoskeletal: Mild edema of the both lower extremity. Appears to be chronic. Skin: Stage II to 3 sacral decubitus ulcer. Neurologic: Alert awake. Follows commands and moves right upper extremity. Psychiatric: Patient has dementia and mental retardation.   Labs on Admission: I have personally reviewed following labs and imaging studies  CBC:  Recent Labs Lab 09/16/16 1702  WBC 11.4*  NEUTROABS 8.0*  HGB 8.6*  HCT 26.4*  MCV 85.4  PLT 523*   Basic Metabolic Panel:  Recent Labs Lab 09/16/16 1702  NA 139  K 4.5  CL 101  CO2 29  GLUCOSE 102*  BUN 30*  CREATININE 1.78*  CALCIUM 9.3   GFR: Estimated Creatinine Clearance: 51.6 mL/min (by C-G formula based on SCr of 1.78 mg/dL (H)). Liver Function Tests:  Recent Labs Lab 09/16/16 1702  AST 28  ALT 24  ALKPHOS 96  BILITOT 0.5  PROT 7.1  ALBUMIN 2.3*   No results for input(s): LIPASE, AMYLASE in the last 168 hours. No results for input(s): AMMONIA in the last 168 hours. Coagulation Profile: No results for input(s): INR, PROTIME in the last 168 hours. Cardiac Enzymes: No results for input(s): CKTOTAL, CKMB, CKMBINDEX, TROPONINI in the last 168 hours. BNP (last 3 results) No results for input(s): PROBNP in the last 8760 hours. HbA1C: No results for input(s): HGBA1C in the last 72 hours. CBG: No results for  input(s): GLUCAP in the last 168 hours. Lipid Profile: No results for input(s): CHOL, HDL, LDLCALC, TRIG, CHOLHDL, LDLDIRECT in the last 72 hours. Thyroid Function Tests:  Recent Labs  09/16/16 2244  TSH 2.218   Anemia Panel: No results for input(s): VITAMINB12, FOLATE, FERRITIN, TIBC, IRON, RETICCTPCT in the last 72 hours. Urine analysis:    Component Value Date/Time   COLORURINE YELLOW 09/16/2016 2009   APPEARANCEUR CLEAR 09/16/2016 2009   LABSPEC 1.013 09/16/2016 2009   PHURINE 5.0 09/16/2016 2009   GLUCOSEU NEGATIVE 09/16/2016 2009   HGBUR NEGATIVE 09/16/2016 2009   BILIRUBINUR NEGATIVE 09/16/2016 2009   KETONESUR NEGATIVE 09/16/2016 2009   PROTEINUR NEGATIVE 09/16/2016 2009   UROBILINOGEN 0.2 06/18/2014 1147  NITRITE NEGATIVE 09/16/2016 2009   LEUKOCYTESUR NEGATIVE 09/16/2016 2009   Sepsis Labs: @LABRCNTIP (procalcitonin:4,lacticidven:4) )No results found for this or any previous visit (from the past 240 hour(s)).   Radiological Exams on Admission: Ct Angio Chest Pe W And/or Wo Contrast  Result Date: 09/16/2016 CLINICAL DATA:  Failure to thrive for 3 days. Tachycardia and tachypnea. History of deep vein thrombosis. EXAM: CT ANGIOGRAPHY CHEST WITH CONTRAST TECHNIQUE: Multidetector CT imaging of the chest was performed using the standard protocol during bolus administration of intravenous contrast. Multiplanar CT image reconstructions and MIPs were obtained to evaluate the vascular anatomy. CONTRAST:  80 cc Isovue 370 COMPARISON:  Chest radiograph September 16, 2016 at 1651 hours FINDINGS: CARDIOVASCULAR: Adequate contrast opacification of the pulmonary artery's. Main pulmonary artery is not enlarged. No pulmonary arterial filling defects to the level of the subsegmental branches. Heart size is normal, no right heart strain. Mild coronary artery calcifications. No pericardial effusions. Thoracic aorta is normal course and caliber, LEFT vertebral artery arises directly from the  aortic arch. MEDIASTINUM/NODES: No lymphadenopathy by CT size criteria. LUNGS/PLEURA: Tracheobronchial tree is patent, no pneumothorax. RIGHT greater than LEFT tree-in-bud infiltrates. Heterogeneously enhancing small consolidation. Enhancing RIGHT middle lobe atelectasis. Bilateral scattered linear atelectasis or scarring. Ex UPPER ABDOMEN: Included view of the abdomen is nonacute. Inferior vena cava filter at the junction of the RIGHT renal vein. Partially imaged RIGHT renal cyst measure least 5.7 cm. Moderate amount of retained large bowel stool. Subcentimeter presumed cyst in dome of the liver. MUSCULOSKELETAL: Elevated RIGHT hemidiaphragm. Visualized soft tissues and included osseous structure are s nonsuspicious. Review of the MIP images confirms the above findings. IMPRESSION: No acute pulmonary embolism. Bilateral tree-in-bud infiltrates may be infectious or inflammatory. RIGHT lung base enhancing atelectasis, possible superimposed pneumonia. Electronically Signed   By: Awilda Metroourtnay  Bloomer M.D.   On: 09/16/2016 23:52   Dg Chest Port 1 View  Result Date: 09/16/2016 CLINICAL DATA:  Fever for 3 days EXAM: PORTABLE CHEST 1 VIEW COMPARISON:  None. FINDINGS: Bilateral airspace disease has improved. There continues to be elevation of the right hemidiaphragm with bibasilar atelectasis. Normal heart size. No pneumothorax. IMPRESSION: Improved bilateral airspace disease.  Bibasilar atelectasis. Electronically Signed   By: Jolaine ClickArthur  Hoss M.D.   On: 09/16/2016 17:05    EKG: Independently reviewed. Sinus tachycardia.  Assessment/Plan Principal Problem:   HCAP (healthcare-associated pneumonia) Active Problems:   Hypertension   Dementia   DVT (deep venous thrombosis) (HCC)   CKD (chronic kidney disease) stage 3, GFR 30-59 ml/min   Mental retardation   Hematoma   Tachycardia   Pneumonia    1. Healthcare associated pneumonia with possible developing sepsis - I have placed patient on vancomycin and cefepime.  Follow blood cultures. Continue with hydration. I have requested a swallow evaluation to rule out aspiration. Hold Lasix. Follow respiratory status. Patient is not in respiratory distress. Tachycardia improved with fluids. 2. History of DVT with thigh hematoma - if patient fails swallow then may need heparin infusion. I have discussed with pharmacist, Mr. Tammy SoursGreg about dosing xarelto or heparin based on the swallow evaluation. 3. Chronic anemia - hemoglobin appears to be at baseline from last discharge. Follow CBC. 4. Chronic kidney disease stage III - follow metabolic panel. Hold Lasix for now. 5. Dementia and mental retardation on Aricept and Namenda Risperdal. 6. Sacral decubitus ulcer stage III - does not look infected. Wound team consult.   DVT prophylaxis: Xarelto. Code Status: DO NOT RESUSCITATE.  Family Communication: No family at the bedside.  Disposition Plan: Nursing home.  Consults called: Wound team for the sacral decubitus.  Admission status: Inpatient.    Eduard ClosKAKRAKANDY,Keola Heninger N. MD [facility] Pager [phone].  If 7PM-7AM, please contact night-coverage [email or web address] Password [email or web address]  09/17/2016, 1:10 AM

## 2016-09-17 NOTE — Progress Notes (Signed)
Pt alert and responsive.  VSS. Denies pain at this time.  Pleasant.

## 2016-09-17 NOTE — Progress Notes (Signed)
Progress note  Patient admitted earlier this morning. See H&P. Continue tx HCAP, aspiration pneumonia. Dysphagia diet, seen by SLP. Spoke with legal guardian over the phone with update as well. Confirmed DNR status.   Noralee StainJennifer Asra Gambrel, DO [facility] [email or web address] Password [email or web address] 09/17/2016, 10:46 AM

## 2016-09-17 NOTE — Progress Notes (Signed)
PHARMACY - PHYSICIAN COMMUNICATION CRITICAL VALUE ALERT - BLOOD CULTURE IDENTIFICATION (BCID)  Results for orders placed or performed during the hospital encounter of 09/16/16  Blood Culture ID Panel (Reflexed) (Collected: 09/16/2016  4:48 PM)  Result Value Ref Range   Enterococcus species NOT DETECTED NOT DETECTED   Listeria monocytogenes NOT DETECTED NOT DETECTED   Staphylococcus species NOT DETECTED NOT DETECTED   Staphylococcus aureus NOT DETECTED NOT DETECTED   Streptococcus species NOT DETECTED NOT DETECTED   Streptococcus agalactiae NOT DETECTED NOT DETECTED   Streptococcus pneumoniae NOT DETECTED NOT DETECTED   Streptococcus pyogenes NOT DETECTED NOT DETECTED   Acinetobacter baumannii NOT DETECTED NOT DETECTED   Enterobacteriaceae species NOT DETECTED NOT DETECTED   Enterobacter cloacae complex NOT DETECTED NOT DETECTED   Escherichia coli NOT DETECTED NOT DETECTED   Klebsiella oxytoca NOT DETECTED NOT DETECTED   Klebsiella pneumoniae NOT DETECTED NOT DETECTED   Proteus species NOT DETECTED NOT DETECTED   Serratia marcescens NOT DETECTED NOT DETECTED   Haemophilus influenzae NOT DETECTED NOT DETECTED   Neisseria meningitidis NOT DETECTED NOT DETECTED   Pseudomonas aeruginosa NOT DETECTED NOT DETECTED   Candida albicans NOT DETECTED NOT DETECTED   Candida glabrata NOT DETECTED NOT DETECTED   Candida krusei NOT DETECTED NOT DETECTED   Candida parapsilosis NOT DETECTED NOT DETECTED   Candida tropicalis NOT DETECTED NOT DETECTED   Lab called for GPC in 1/2 blood cxs (anaerobic bottle). BCID was negative for identification.  Name of physician (or Provider) Contacted: Lynch  Changes to prescribed antibiotics required: Continue current antibiotics.   Sheppard CoilFrank Wilson PharmD., BCPS Clinical Pharmacist Pager [phone] 09/17/2016 10:23 PM

## 2016-09-17 NOTE — Consult Note (Signed)
WOC Nurse wound consult note Reason for Consult: patient known to this WOC nurse from last admission.  Mentally challenged gentlemen from a facility Wound type: evolution from a DTI, now Stage 2 Pressure Injury Pressure Ulcer POA: Yes Measurement: 6cm x 4cm x 0.1cm  Wound bed: pink, clean, moist Drainage (amount, consistency, odor) minimal Periwound: intact  Dressing procedure/placement/frequency: Continue silicone foam dressing, change every 3 days and PRN soilage. Add low air loss mattress for pressure redistribution.   Discussed POC with patient and bedside nurse.  Re consult if needed, will not follow at this time. Thanks  Melis Trochez M.D.[employer] MSN, RN,CWOCN, CNS [phone])

## 2016-09-17 NOTE — Progress Notes (Signed)
Pharmacy Antibiotic Note  [name] is a 65 y.o. male admitted on 09/16/2016 with failure to thrive.  Pharmacy has been consulted for Vancomycin dosing for PNA. Recent admission for PNA. CXR shows improved but still present bilateral airspace disease. WBC 11.4. Noted renal dysfunction.   Plan: -Vancomycin 750 mg IV q12h -Cefepime per MD -Trend WBC, temp, renal function  -Drug levels as indicated   Height: 6' (182.9 cm) IBW/kg (Calculated) : 77.6  Temp (24hrs), Avg:98.6 F (37 C), Min:97.5 F (36.4 C), Max:99.6 F (37.6 C)   Recent Labs Lab 09/16/16 1702 09/16/16 1716 09/16/16 2011  WBC 11.4*  --   --   CREATININE 1.78*  --   --   LATICACIDVEN  --  0.87 0.64    Estimated Creatinine Clearance: 51.6 mL/min (by C-G formula based on SCr of 1.78 mg/dL (H)).    No Known Allergies   [name], [name] 09/17/2016 1:16 AM

## 2016-09-18 LAB — BASIC METABOLIC PANEL
Anion gap: 8 (ref 5–15)
BUN: 20 mg/dL (ref 6–20)
CHLORIDE: 105 mmol/L (ref 101–111)
CO2: 28 mmol/L (ref 22–32)
CREATININE: 1.37 mg/dL — AB (ref 0.61–1.24)
Calcium: 9 mg/dL (ref 8.9–10.3)
GFR calc Af Amer: >60 mL/min (ref >60)
GFR calc non Af Amer: 53 mL/min — ABNORMAL LOW (ref >60)
Glucose, Bld: 87 mg/dL (ref 65–99)
Potassium: 4.1 mmol/L (ref 3.5–5.1)
Sodium: 141 mmol/L (ref 135–145)

## 2016-09-18 LAB — GLUCOSE, CAPILLARY
GLUCOSE-CAPILLARY: 86 mg/dL (ref 65–99)
Glucose-Capillary: 99 mg/dL (ref 65–99)
Glucose-Capillary: 108 mg/dL — ABNORMAL HIGH (ref 65–99)

## 2016-09-18 LAB — CBC WITH DIFFERENTIAL/PLATELET
Basophils Absolute: 0 10*3/uL (ref 0.0–0.1)
Basophils Relative: 0 %
EOS PCT: 5 %
Eosinophils Absolute: 0.5 10*3/uL (ref 0.0–0.7)
HCT: 23.1 % — ABNORMAL LOW (ref 39.0–52.0)
HEMOGLOBIN: 7.3 g/dL — AB (ref 13.0–17.0)
LYMPHS ABS: 1.8 10*3/uL (ref 0.7–4.0)
Lymphocytes Relative: 20 %
MCH: 27.4 pg (ref 26.0–34.0)
MCHC: 31.6 g/dL (ref 30.0–36.0)
MCV: 86.8 fL (ref 78.0–100.0)
MONO ABS: 0.7 10*3/uL (ref 0.1–1.0)
MONOS PCT: 7 %
NEUTROS PCT: 68 %
Neutro Abs: 6.2 10*3/uL (ref 1.7–7.7)
Platelets: 464 10*3/uL — ABNORMAL HIGH (ref 150–400)
RBC: 2.66 MIL/uL — ABNORMAL LOW (ref 4.22–5.81)
RDW: 16.9 % — AB (ref 11.5–15.5)
WBC: 9.2 10*3/uL (ref 4.0–10.5)

## 2016-09-18 LAB — URINE CULTURE: Culture: NO GROWTH

## 2016-09-18 MED ORDER — AMOXICILLIN-POT CLAVULANATE 875-125 MG PO TABS
1.0000 | ORAL_TABLET | Freq: Two times a day (BID) | ORAL | Status: DC
  Administered 2016-09-18 (×2): 1 via ORAL
  Filled 2016-09-18 (×2): qty 1

## 2016-09-18 NOTE — Progress Notes (Signed)
PROGRESS NOTE    [name] Bakernest Philemon  ZOX:[id number] DOB: 10/15/1950 DOA: 09/16/2016 PCP: Colon BranchQURESHI, AYYAZ, MD     Brief Narrative:  [name] is a 65 y.o. male with dementia, lower extremity DVT and hematoma, sacral wound, chronic anemia and kidney disease who was recently admitted last month for sepsis from pneumonia was brought in the ER after patient's nursing facility found that patient has not been eating well for last 3 days and was febrile. CTA was negative for PE, but showed pneumonia. Patient was admitted for further treatment and care.   Assessment & Plan:   Principal Problem:   HCAP (healthcare-associated pneumonia) Active Problems:   Hypertension   Dementia   DVT (deep venous thrombosis) (HCC)   CKD (chronic kidney disease) stage 3, GFR 30-59 ml/min   Mental retardation   Hematoma   Tachycardia   Pneumonia   Sepsis secondary to HCAP versus aspiration pneumonia in right lung base  -Influenza negative  -Blood cultures with 1 of 2 GPC, likely a contaminant. Repeat blood cultures.  -Sputum culture pending  -SLP evaluation: Dysphagia diet -Deescalate antibiotics to Augmentin   History of DVT -Continue Xarelto  Chronic normocytic anemia  -Baseline Hgb 8 -Stable  CKD stage 3 -Baseline Cr 1.5-1.7 -Stable  Dementia and mental retardation -Contonue Aricept, Namenda, Risperal  Sacral decub ulcer stage 2, POA -Wound team rec to continue silicone foam dressing, change every 3 days and PRN soilage. Add low air loss mattress for pressure redistribution.    DVT prophylaxis: Xarelto Code Status: DNR Family Communication: spoke with legal guardian over the phone on 12/8 Disposition Plan: pending further improvement, back to SNF home    Consultants:   None  Procedures:   None  Antimicrobials:   Vanco/cefepime 12/8-12/9  Augmentin 12/9 >>    Subjective: Patient alert but not interactive and does not answer any questions.   Objective: Vitals:   09/17/16  0432 09/17/16 1424 09/17/16 1941 09/18/16 0458  BP: 95/64 (!) 95/54 100/66 116/72  Pulse: 84 91 91 91  Resp: 18 17 18 18   Temp: 97.6 F (36.4 C) 99 F (37.2 C) 98.5 F (36.9 C) 98.5 F (36.9 C)  TempSrc: Oral Oral Oral Oral  SpO2: 100% 92% 96% 99%  Weight: 94 kg (207 lb 3.7 oz)   96.4 kg (212 lb 8.4 oz)  Height:        Intake/Output Summary (Last 24 hours) at 09/18/16 0724 Last data filed at 09/18/16 0502  Gross per 24 hour  Intake              320 ml  Output             1150 ml  Net             -830 ml   Filed Weights   09/17/16 0043 09/17/16 0432 09/18/16 0458  Weight: 94 kg (207 lb 3.7 oz) 94 kg (207 lb 3.7 oz) 96.4 kg (212 lb 8.4 oz)    Examination:  General exam: Appears calm and comfortable  Respiratory system: Clear to auscultation anteriorly. Respiratory effort normal. Cardiovascular system: S1 & S2 heard, RRR. No JVD, murmurs, rubs, gallops or clicks. No pedal edema. Gastrointestinal system: Abdomen is nondistended, soft and nontender. No organomegaly or masses felt. Normal bowel sounds heard. Central nervous system: Alert, non-interactive  Extremities: Symmetric Skin: dry scaly skin in extremities   Data Reviewed: I have personally reviewed following labs and imaging studies  CBC:  Recent Labs Lab 09/16/16 1702 09/17/16  0321 09/18/16 0328  WBC 11.4* 10.6* 9.2  NEUTROABS 8.0* 7.3 6.2  HGB 8.6* 7.9* 7.3*  HCT 26.4* 24.9* 23.1*  MCV 85.4 86.2 86.8  PLT 523* 479* 464*   Basic Metabolic Panel:  Recent Labs Lab 09/16/16 1702 09/17/16 0321 09/18/16 0328  NA 139 139 141  K 4.5 4.2 4.1  CL 101 102 105  CO2 29 29 28   GLUCOSE 102* 108* 87  BUN 30* 26* 20  CREATININE 1.78* 1.55* 1.37*  CALCIUM 9.3 9.1 9.0   GFR: Estimated Creatinine Clearance: 66.4 mL/min (by C-G formula based on SCr of 1.37 mg/dL (H)). Liver Function Tests:  Recent Labs Lab 09/16/16 1702 09/17/16 0321  AST 28 21  ALT 24 21  ALKPHOS 96 86  BILITOT 0.5 0.5  PROT 7.1 6.7    ALBUMIN 2.3* 2.1*   No results for input(s): LIPASE, AMYLASE in the last 168 hours. No results for input(s): AMMONIA in the last 168 hours. Coagulation Profile: No results for input(s): INR, PROTIME in the last 168 hours. Cardiac Enzymes: No results for input(s): CKTOTAL, CKMB, CKMBINDEX, TROPONINI in the last 168 hours. BNP (last 3 results) No results for input(s): PROBNP in the last 8760 hours. HbA1C: No results for input(s): HGBA1C in the last 72 hours. CBG:  Recent Labs Lab 09/17/16 0617 09/17/16 1123 09/17/16 1656 09/17/16 2057 09/18/16 0311  GLUCAP 78 94 108* 118* 86   Lipid Profile: No results for input(s): CHOL, HDL, LDLCALC, TRIG, CHOLHDL, LDLDIRECT in the last 72 hours. Thyroid Function Tests:  Recent Labs  09/16/16 2244  TSH 2.218   Anemia Panel: No results for input(s): VITAMINB12, FOLATE, FERRITIN, TIBC, IRON, RETICCTPCT in the last 72 hours. Sepsis Labs:  Recent Labs Lab 09/16/16 1702 09/16/16 1716 09/16/16 2011  PROCALCITON 0.19  --   --   LATICACIDVEN  --  0.87 0.64    Recent Results (from the past 240 hour(s))  Blood Culture (routine x 2)     Status: None (Preliminary result)   Collection Time: 09/16/16  4:48 PM  Result Value Ref Range Status   Specimen Description BLOOD RIGHT WRIST  Final   Special Requests BOTTLES DRAWN AEROBIC AND ANAEROBIC 10CC  Final   Culture  Setup Time   Final    GRAM POSITIVE COCCI IN CLUSTERS ANAEROBIC BOTTLE ONLY Organism ID to follow CRITICAL RESULT CALLED TO, READ BACK BY AND VERIFIED WITH: Kelby FamF WILSON [facility] 2155 09/17/16 A BROWNING    Culture NO GROWTH < 24 HOURS  Final   Report Status PENDING  Incomplete  Blood Culture ID Panel (Reflexed)     Status: None   Collection Time: 09/16/16  4:48 PM  Result Value Ref Range Status   Enterococcus species NOT DETECTED NOT DETECTED Final   Listeria monocytogenes NOT DETECTED NOT DETECTED Final   Staphylococcus species NOT DETECTED NOT DETECTED Final   Staphylococcus  aureus NOT DETECTED NOT DETECTED Final   Streptococcus species NOT DETECTED NOT DETECTED Final   Streptococcus agalactiae NOT DETECTED NOT DETECTED Final   Streptococcus pneumoniae NOT DETECTED NOT DETECTED Final   Streptococcus pyogenes NOT DETECTED NOT DETECTED Final   Acinetobacter baumannii NOT DETECTED NOT DETECTED Final   Enterobacteriaceae species NOT DETECTED NOT DETECTED Final   Enterobacter cloacae complex NOT DETECTED NOT DETECTED Final   Escherichia coli NOT DETECTED NOT DETECTED Final   Klebsiella oxytoca NOT DETECTED NOT DETECTED Final   Klebsiella pneumoniae NOT DETECTED NOT DETECTED Final   Proteus species NOT DETECTED NOT DETECTED Final  Serratia marcescens NOT DETECTED NOT DETECTED Final   Haemophilus influenzae NOT DETECTED NOT DETECTED Final   Neisseria meningitidis NOT DETECTED NOT DETECTED Final   Pseudomonas aeruginosa NOT DETECTED NOT DETECTED Final   Candida albicans NOT DETECTED NOT DETECTED Final   Candida glabrata NOT DETECTED NOT DETECTED Final   Candida krusei NOT DETECTED NOT DETECTED Final   Candida parapsilosis NOT DETECTED NOT DETECTED Final   Candida tropicalis NOT DETECTED NOT DETECTED Final  Blood Culture (routine x 2)     Status: None (Preliminary result)   Collection Time: 09/16/16  5:02 PM  Result Value Ref Range Status   Specimen Description BLOOD RIGHT HAND  Final   Special Requests BOTTLES DRAWN AEROBIC AND ANAEROBIC 5CC  Final   Culture NO GROWTH < 24 HOURS  Final   Report Status PENDING  Incomplete       Radiology Studies: Ct Angio Chest Pe W And/or Wo Contrast  Result Date: 09/16/2016 CLINICAL DATA:  Failure to thrive for 3 days. Tachycardia and tachypnea. History of deep vein thrombosis. EXAM: CT ANGIOGRAPHY CHEST WITH CONTRAST TECHNIQUE: Multidetector CT imaging of the chest was performed using the standard protocol during bolus administration of intravenous contrast. Multiplanar CT image reconstructions and MIPs were obtained to  evaluate the vascular anatomy. CONTRAST:  80 cc Isovue 370 COMPARISON:  Chest radiograph September 16, 2016 at 1651 hours FINDINGS: CARDIOVASCULAR: Adequate contrast opacification of the pulmonary artery's. Main pulmonary artery is not enlarged. No pulmonary arterial filling defects to the level of the subsegmental branches. Heart size is normal, no right heart strain. Mild coronary artery calcifications. No pericardial effusions. Thoracic aorta is normal course and caliber, LEFT vertebral artery arises directly from the aortic arch. MEDIASTINUM/NODES: No lymphadenopathy by CT size criteria. LUNGS/PLEURA: Tracheobronchial tree is patent, no pneumothorax. RIGHT greater than LEFT tree-in-bud infiltrates. Heterogeneously enhancing small consolidation. Enhancing RIGHT middle lobe atelectasis. Bilateral scattered linear atelectasis or scarring. Ex UPPER ABDOMEN: Included view of the abdomen is nonacute. Inferior vena cava filter at the junction of the RIGHT renal vein. Partially imaged RIGHT renal cyst measure least 5.7 cm. Moderate amount of retained large bowel stool. Subcentimeter presumed cyst in dome of the liver. MUSCULOSKELETAL: Elevated RIGHT hemidiaphragm. Visualized soft tissues and included osseous structure are s nonsuspicious. Review of the MIP images confirms the above findings. IMPRESSION: No acute pulmonary embolism. Bilateral tree-in-bud infiltrates may be infectious or inflammatory. RIGHT lung base enhancing atelectasis, possible superimposed pneumonia. Electronically Signed   By: Awilda Metro M.D.   On: 09/16/2016 23:52   Dg Chest Port 1 View  Result Date: 09/16/2016 CLINICAL DATA:  Fever for 3 days EXAM: PORTABLE CHEST 1 VIEW COMPARISON:  None. FINDINGS: Bilateral airspace disease has improved. There continues to be elevation of the right hemidiaphragm with bibasilar atelectasis. Normal heart size. No pneumothorax. IMPRESSION: Improved bilateral airspace disease.  Bibasilar atelectasis.  Electronically Signed   By: Jolaine Click M.D.   On: 09/16/2016 17:05      Scheduled Meds: . ARIPiprazole  30 mg Oral Daily  . ceFEPime (MAXIPIME) IV  1 g Intravenous Q8H  . donepezil  10 mg Oral QHS  . feeding supplement (ENSURE ENLIVE)  237 mL Oral BID BM  . feeding supplement (PRO-STAT SUGAR FREE 64)  30 mL Oral BID  . gabapentin  100 mg Oral QHS  . mouth rinse  15 mL Mouth Rinse BID  . memantine  10 mg Oral BID  . multivitamin with minerals  1 tablet Oral Daily  .  polyethylene glycol  17 g Oral BID  . potassium chloride  10 mEq Oral Daily  . risperiDONE  1 mg Oral QHS  . risperiDONE  2 mg Oral Q breakfast  . rivaroxaban  20 mg Oral Q supper  . vancomycin  750 mg Intravenous Q12H   Continuous Infusions:   LOS: 2 days    Time spent: 30 minutes   Noralee Stain, DO [facility] [email or web address] Password [email or web address] 09/18/2016, 7:24 AM

## 2016-09-18 NOTE — Evaluation (Signed)
Physical Therapy Evaluation Patient Details Name: [name] MRN: [id number] DOB: 09/17/1951 Today's Date: 09/18/2016   History of Present Illness  Pt is a 65 y.o. male with dementia, lower extremity DVT and hematoma, sacral wound, chronic anemia and kidney disease who was recently admitted last month for sepsis from pneumonia was brought in the ER after patient's nursing facility found that patient has not been eating well for last 3 days and was febrile. Patient is not communicative. In the ER patient was found to be tachycardic with heart rate in the 120s. Blood pressure was in the low normal. Patient was mildly febrile with leukocytosis. Since patient was persistently tachycardic CT angiogram of the chest was done given the history of DVT. CT angiogram of the chest was negative for PE but does shows features concerning for pneumonia. Patient is being admitted for possible developing sepsis from pneumonia.   Clinical Impression  Pt with significant mobility needs as he was unable to participate in rolling and sit to/from stand.  Pt's baseline mobility difficulty to ascertain.  Will continue to follow patient while on this venue of care to progress mobility.    Follow Up Recommendations SNF;Supervision/Assistance - 24 hour    Equipment Recommendations  None recommended by PT    Recommendations for Other Services       Precautions / Restrictions Precautions Precautions: Fall Precaution Comments: contractures/skin breakdown Restrictions Weight Bearing Restrictions: No Other Position/Activity Restrictions: high risk for pressure ulceration      Mobility  Bed Mobility Overal bed mobility: Needs Assistance Bed Mobility: Supine to Sit;Rolling;Sit to Supine Rolling: Total assist   Supine to sit: Total assist;HOB elevated Sit to supine: Total assist   General bed mobility comments: total assist with repositioning including scoot to HOB (used reverse Trendelenburg positioning), <5%  participation in bed mobility  Transfers Overall transfer level: Needs assistance Equipment used: None Transfers: Sit to/from Stand Sit to Stand: From elevated surface;+2 physical assistance         General transfer comment: therapist unable to clear pt's hips off bed, pt did not participate in transfer attempt, he winced with trial of sit to stand  Ambulation/Gait                Stairs            Wheelchair Mobility    Modified Rankin (Stroke Patients Only)       Balance Overall balance assessment: Needs assistance Sitting-balance support: Feet supported;Bilateral upper extremity supported Sitting balance-Leahy Scale: Zero Sitting balance - Comments: drifts to the left Postural control: Left lateral lean                                   Pertinent Vitals/Pain Pain Assessment: No/denies pain (pt moaned with left leg abduction during bed mobility)    Home Living Family/patient expects to be discharged to:: Skilled nursing facility                 Additional Comments: Pt admitted from [facility]    Prior Function Level of Independence: Needs assistance   Gait / Transfers Assistance Needed: unsure of baseline for gait           Hand Dominance   Dominant Hand: Right    Extremity/Trunk Assessment   Upper Extremity Assessment: Generalized weakness (minimal volitional activity in either UE during mobility)           Lower  Extremity Assessment: Generalized weakness (ankle pump after demo to pt left foot only, adductor tone bi)      Cervical / Trunk Assessment: Kyphotic  Communication   Communication: Expressive difficulties  Cognition Arousal/Alertness: Awake/alert Behavior During Therapy: Flat affect Overall Cognitive Status: Difficult to assess                 General Comments: pt able to state his name    General Comments General comments (skin integrity, edema, etc.): dry, scaling skin bil lower  legs/feet. Pre-activity HR 96 bpm, 97% oxygen saturation, post activity HR 98 bpm, 98% oxygen saturation on RA.  mucous drainage from mouth and nose during mobility    Exercises     Assessment/Plan    PT Assessment Patient needs continued PT services  PT Problem List Decreased strength;Decreased activity tolerance;Decreased balance;Decreased mobility;Decreased safety awareness;Decreased range of motion;Decreased cognition;Cardiopulmonary status limiting activity          PT Treatment Interventions Functional mobility training;Balance training;Neuromuscular re-education;Therapeutic exercise;Patient/family education;Therapeutic activities    PT Goals (Current goals can be found in the Care Plan section)  Acute Rehab PT Goals Patient Stated Goal: Unable to state PT Goal Formulation: Patient unable to participate in goal setting Time For Goal Achievement: 10/01/16 Potential to Achieve Goals: Fair    Frequency Min 2X/week   Barriers to discharge        Co-evaluation               End of Session Equipment Utilized During Treatment: Gait belt Activity Tolerance: Other (comment) (pt wtih poor tolerance secondary to cognitive deficits) Patient left: in bed;with call bell/phone within reach;with nursing/sitter in room Nurse Communication: Mobility status;Need for lift equipment         Time: 0934-1006 PT Time Calculation (min) (ACUTE ONLY): 32 min   Charges:   PT Evaluation $PT Eval Moderate Complexity: 1 Procedure PT Treatments $Therapeutic Activity: 8-22 mins   PT G CodesNestor Lewandowsky:       Sujata Maines M Adiel Mcnamara, PT [phone]  [employer] 09/18/2016, 10:47 AM

## 2016-09-19 LAB — CBC WITH DIFFERENTIAL/PLATELET
BASOS ABS: 0 10*3/uL (ref 0.0–0.1)
Basophils Relative: 0 %
EOS ABS: 0.4 10*3/uL (ref 0.0–0.7)
EOS PCT: 5 %
HCT: 25.2 % — ABNORMAL LOW (ref 39.0–52.0)
HEMOGLOBIN: 7.9 g/dL — AB (ref 13.0–17.0)
LYMPHS ABS: 1.5 10*3/uL (ref 0.7–4.0)
LYMPHS PCT: 18 %
MCH: 27.3 pg (ref 26.0–34.0)
MCHC: 31.3 g/dL (ref 30.0–36.0)
MCV: 87.2 fL (ref 78.0–100.0)
Monocytes Absolute: 0.9 10*3/uL (ref 0.1–1.0)
Monocytes Relative: 10 %
NEUTROS PCT: 67 %
Neutro Abs: 5.8 10*3/uL (ref 1.7–7.7)
Platelets: 479 10*3/uL — ABNORMAL HIGH (ref 150–400)
RBC: 2.89 MIL/uL — ABNORMAL LOW (ref 4.22–5.81)
RDW: 16.9 % — ABNORMAL HIGH (ref 11.5–15.5)
WBC: 8.6 10*3/uL (ref 4.0–10.5)

## 2016-09-19 LAB — BASIC METABOLIC PANEL WITH GFR
Anion gap: 7 (ref 5–15)
BUN: 19 mg/dL (ref 6–20)
CO2: 29 mmol/L (ref 22–32)
Calcium: 9.1 mg/dL (ref 8.9–10.3)
Chloride: 104 mmol/L (ref 101–111)
Creatinine, Ser: 1.41 mg/dL — ABNORMAL HIGH (ref 0.61–1.24)
GFR calc Af Amer: 60 mL/min — ABNORMAL LOW
GFR calc non Af Amer: 52 mL/min — ABNORMAL LOW
Glucose, Bld: 95 mg/dL (ref 65–99)
Potassium: 4.1 mmol/L (ref 3.5–5.1)
Sodium: 140 mmol/L (ref 135–145)

## 2016-09-19 LAB — GLUCOSE, CAPILLARY
Glucose-Capillary: 101 mg/dL — ABNORMAL HIGH (ref 65–99)
Glucose-Capillary: 116 mg/dL — ABNORMAL HIGH (ref 65–99)
Glucose-Capillary: 86 mg/dL (ref 65–99)

## 2016-09-19 LAB — CULTURE, BLOOD (ROUTINE X 2)

## 2016-09-19 MED ORDER — PIPERACILLIN-TAZOBACTAM 3.375 G IVPB
3.3750 g | Freq: Three times a day (TID) | INTRAVENOUS | Status: DC
  Administered 2016-09-19 – 2016-09-20 (×4): 3.375 g via INTRAVENOUS
  Filled 2016-09-19 (×5): qty 50
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
## 2016-09-19 NOTE — Progress Notes (Signed)
Pt refusing AM CBG.

## 2016-09-20 LAB — CBC WITH DIFFERENTIAL/PLATELET
BASOS ABS: 0 10*3/uL (ref 0.0–0.1)
BASOS PCT: 0 %
EOS PCT: 3 %
Eosinophils Absolute: 0.3 10*3/uL (ref 0.0–0.7)
HCT: 24.1 % — ABNORMAL LOW (ref 39.0–52.0)
Hemoglobin: 7.7 g/dL — ABNORMAL LOW (ref 13.0–17.0)
LYMPHS PCT: 18 %
Lymphs Abs: 1.6 10*3/uL (ref 0.7–4.0)
MCH: 28.1 pg (ref 26.0–34.0)
MCHC: 32 g/dL (ref 30.0–36.0)
MCV: 88 fL (ref 78.0–100.0)
Monocytes Absolute: 0.6 10*3/uL (ref 0.1–1.0)
Monocytes Relative: 6 %
NEUTROS ABS: 6.8 10*3/uL (ref 1.7–7.7)
Neutrophils Relative %: 73 %
PLATELETS: 462 10*3/uL — AB (ref 150–400)
RBC: 2.74 MIL/uL — AB (ref 4.22–5.81)
RDW: 16.9 % — AB (ref 11.5–15.5)
WBC: 9.3 10*3/uL (ref 4.0–10.5)

## 2016-09-20 LAB — GLUCOSE, CAPILLARY
GLUCOSE-CAPILLARY: 94 mg/dL (ref 65–99)
Glucose-Capillary: 95 mg/dL (ref 65–99)
Glucose-Capillary: 97 mg/dL (ref 65–99)

## 2016-09-20 LAB — BASIC METABOLIC PANEL
ANION GAP: 9 (ref 5–15)
BUN: 20 mg/dL (ref 6–20)
CALCIUM: 9.2 mg/dL (ref 8.9–10.3)
CO2: 29 mmol/L (ref 22–32)
Chloride: 101 mmol/L (ref 101–111)
Creatinine, Ser: 1.5 mg/dL — ABNORMAL HIGH (ref 0.61–1.24)
GFR calc Af Amer: 55 mL/min — ABNORMAL LOW (ref >60)
GFR, EST NON AFRICAN AMERICAN: 48 mL/min — AB (ref >60)
Glucose, Bld: 90 mg/dL (ref 65–99)
POTASSIUM: 4.2 mmol/L (ref 3.5–5.1)
SODIUM: 139 mmol/L (ref 135–145)

## 2016-09-20 MED ORDER — AMOXICILLIN-POT CLAVULANATE 875-125 MG PO TABS
1.0000 | ORAL_TABLET | Freq: Two times a day (BID) | ORAL | Status: DC
  Administered 2016-09-20 – 2016-09-21 (×4): 1 via ORAL
  Filled 2016-09-20 (×4): qty 1

## 2016-09-20 NOTE — Progress Notes (Addendum)
PROGRESS NOTE    [name] Bakernest Twyman  ZOX:[id number] DOB: 02/15/1951 DOA: 09/16/2016 PCP: Colon BranchQURESHI, AYYAZ, MD     Brief Narrative:  [name] is a 65 y.o. male with dementia, lower extremity DVT and hematoma, sacral wound, chronic anemia and kidney disease who was recently admitted last month for sepsis from pneumonia was brought in the ER after patient's nursing facility found that patient has not been eating well for last 3 days and was febrile. CTA was negative for PE, but showed pneumonia. Patient was admitted for further treatment and care.   Assessment & Plan:   Principal Problem:   HCAP (healthcare-associated pneumonia) Active Problems:   Hypertension   Dementia   DVT (deep venous thrombosis) (HCC)   CKD (chronic kidney disease) stage 3, GFR 30-59 ml/min   Mental retardation   Hematoma   Tachycardia   Pneumonia   Sepsis secondary to HCAP versus aspiration pneumonia in right lung base  -Influenza negative  -Blood cultures with 1 of 2 GPC, likely a contaminant. Repeat blood cultures negative to date.  -Sputum culture pending  -SLP evaluation: Dysphagia diet -Vanco/cefepime --> deescalated to Augmentin and had a fever, antibiotic switched to zosyn --> afebrile 24 hours and now switched back to Augmentin. Suspect main issue at hand is aspiration and will need to cover anaerobes.   History of DVT -Continue Xarelto  Chronic normocytic anemia  -Baseline Hgb 8 -Stable  CKD stage 3 -Baseline Cr 1.5-1.7 -Stable  Dementia and mental retardation -Contonue Aricept, Namenda, Risperal  Sacral decub ulcer stage 2, POA -Wound team rec to continue silicone foam dressing, change every 3 days and PRN soilage. Add low air loss mattress for pressure redistribution.    DVT prophylaxis: Xarelto Code Status: DNR Family Communication: spoke with legal guardian over the phone today  Disposition Plan: pending further improvement, back to SNF home if afebrile on oral antibiotics 24  hours   Consultants:   None  Procedures:   None  Antimicrobials:   Vanco/cefepime 12/8-12/9  Augmentin 12/9-12/10  Zosyn 12/10-12/11  Augmentin 12/11 >>>   Subjective: Patient sleeping comfortably. Has no complaints this morning. Appears lethargic and has been refusing nursing care this morning.   Objective: Vitals:   09/19/16 0632 09/19/16 1344 09/19/16 2107 09/20/16 0534  BP: 95/62 100/66 107/72   Pulse: 99 60    Resp: 20 (!) 21    Temp: 98.4 F (36.9 C) 98.5 F (36.9 C) 98.3 F (36.8 C)   TempSrc: Oral Oral Oral   SpO2: 100% 99% 98%   Weight: 96.2 kg (212 lb 1.3 oz)   93.9 kg (207 lb 1.6 oz)  Height:        Intake/Output Summary (Last 24 hours) at 09/20/16 1039 Last data filed at 09/19/16 1754  Gross per 24 hour  Intake              720 ml  Output                0 ml  Net              720 ml   Filed Weights   09/18/16 0458 09/19/16 0632 09/20/16 0534  Weight: 96.4 kg (212 lb 8.4 oz) 96.2 kg (212 lb 1.3 oz) 93.9 kg (207 lb 1.6 oz)    Examination:  General exam: Appears calm and comfortable, sleeping on exam  Respiratory system: Clear to auscultation anteriorly. Respiratory effort normal. Cardiovascular system: S1 & S2 heard, RRR. No JVD, murmurs, rubs, gallops or  clicks. No pedal edema. Gastrointestinal system: Abdomen is nondistended, soft and nontender. No organomegaly or masses felt. Normal bowel sounds heard. Central nervous system: Alert, non-interactive, sleepy and does awaken to voice  Extremities: Symmetric Skin: dry scaly skin in extremities   Data Reviewed: I have personally reviewed following labs and imaging studies  CBC:  Recent Labs Lab 09/16/16 1702 09/17/16 0321 09/18/16 0328 09/19/16 0253 09/20/16 0811  WBC 11.4* 10.6* 9.2 8.6 9.3  NEUTROABS 8.0* 7.3 6.2 5.8 6.8  HGB 8.6* 7.9* 7.3* 7.9* 7.7*  HCT 26.4* 24.9* 23.1* 25.2* 24.1*  MCV 85.4 86.2 86.8 87.2 88.0  PLT 523* 479* 464* 479* 462*   Basic Metabolic Panel:  Recent  Labs Lab 09/16/16 1702 09/17/16 0321 09/18/16 0328 09/19/16 0253 09/20/16 0811  NA 139 139 141 140 139  K 4.5 4.2 4.1 4.1 4.2  CL 101 102 105 104 101  CO2 29 29 28 29 29   GLUCOSE 102* 108* 87 95 90  BUN 30* 26* 20 19 20   CREATININE 1.78* 1.55* 1.37* 1.41* 1.50*  CALCIUM 9.3 9.1 9.0 9.1 9.2   GFR: Estimated Creatinine Clearance: 60 mL/min (by C-G formula based on SCr of 1.5 mg/dL (H)). Liver Function Tests:  Recent Labs Lab 09/16/16 1702 09/17/16 0321  AST 28 21  ALT 24 21  ALKPHOS 96 86  BILITOT 0.5 0.5  PROT 7.1 6.7  ALBUMIN 2.3* 2.1*   No results for input(s): LIPASE, AMYLASE in the last 168 hours. No results for input(s): AMMONIA in the last 168 hours. Coagulation Profile: No results for input(s): INR, PROTIME in the last 168 hours. Cardiac Enzymes: No results for input(s): CKTOTAL, CKMB, CKMBINDEX, TROPONINI in the last 168 hours. BNP (last 3 results) No results for input(s): PROBNP in the last 8760 hours. HbA1C: No results for input(s): HGBA1C in the last 72 hours. CBG:  Recent Labs Lab 09/18/16 1200 09/18/16 2121 09/19/16 0039 09/19/16 1202 09/19/16 2134  GLUCAP 99 108* 101* 86 116*   Lipid Profile: No results for input(s): CHOL, HDL, LDLCALC, TRIG, CHOLHDL, LDLDIRECT in the last 72 hours. Thyroid Function Tests: No results for input(s): TSH, T4TOTAL, FREET4, T3FREE, THYROIDAB in the last 72 hours. Anemia Panel: No results for input(s): VITAMINB12, FOLATE, FERRITIN, TIBC, IRON, RETICCTPCT in the last 72 hours. Sepsis Labs:  Recent Labs Lab 09/16/16 1702 09/16/16 1716 09/16/16 2011  PROCALCITON 0.19  --   --   LATICACIDVEN  --  0.87 0.64    Recent Results (from the past 240 hour(s))  Blood Culture (routine x 2)     Status: Abnormal   Collection Time: 09/16/16  4:48 PM  Result Value Ref Range Status   Specimen Description BLOOD RIGHT WRIST  Final   Special Requests BOTTLES DRAWN AEROBIC AND ANAEROBIC 10CC  Final   Culture  Setup Time    Final    GRAM POSITIVE COCCI IN CLUSTERS ANAEROBIC BOTTLE ONLY CRITICAL RESULT CALLED TO, READ BACK BY AND VERIFIED WITH: Kelby FamF WILSON [facility] 2155 09/17/16 A BROWNING    Culture (A)  Final    STAPHYLOCOCCUS SPECIES (COAGULASE NEGATIVE) THE SIGNIFICANCE OF ISOLATING THIS ORGANISM FROM A SINGLE SET OF BLOOD CULTURES WHEN MULTIPLE SETS ARE DRAWN IS UNCERTAIN. PLEASE NOTIFY THE MICROBIOLOGY DEPARTMENT WITHIN ONE WEEK IF SPECIATION AND SENSITIVITIES ARE REQUIRED.    Report Status 09/19/2016 FINAL  Final  Blood Culture ID Panel (Reflexed)     Status: None   Collection Time: 09/16/16  4:48 PM  Result Value Ref Range Status  Enterococcus species NOT DETECTED NOT DETECTED Final   Listeria monocytogenes NOT DETECTED NOT DETECTED Final   Staphylococcus species NOT DETECTED NOT DETECTED Final   Staphylococcus aureus NOT DETECTED NOT DETECTED Final   Streptococcus species NOT DETECTED NOT DETECTED Final   Streptococcus agalactiae NOT DETECTED NOT DETECTED Final   Streptococcus pneumoniae NOT DETECTED NOT DETECTED Final   Streptococcus pyogenes NOT DETECTED NOT DETECTED Final   Acinetobacter baumannii NOT DETECTED NOT DETECTED Final   Enterobacteriaceae species NOT DETECTED NOT DETECTED Final   Enterobacter cloacae complex NOT DETECTED NOT DETECTED Final   Escherichia coli NOT DETECTED NOT DETECTED Final   Klebsiella oxytoca NOT DETECTED NOT DETECTED Final   Klebsiella pneumoniae NOT DETECTED NOT DETECTED Final   Proteus species NOT DETECTED NOT DETECTED Final   Serratia marcescens NOT DETECTED NOT DETECTED Final   Haemophilus influenzae NOT DETECTED NOT DETECTED Final   Neisseria meningitidis NOT DETECTED NOT DETECTED Final   Pseudomonas aeruginosa NOT DETECTED NOT DETECTED Final   Candida albicans NOT DETECTED NOT DETECTED Final   Candida glabrata NOT DETECTED NOT DETECTED Final   Candida krusei NOT DETECTED NOT DETECTED Final   Candida parapsilosis NOT DETECTED NOT DETECTED Final   Candida  tropicalis NOT DETECTED NOT DETECTED Final  Blood Culture (routine x 2)     Status: None (Preliminary result)   Collection Time: 09/16/16  5:02 PM  Result Value Ref Range Status   Specimen Description BLOOD RIGHT HAND  Final   Special Requests BOTTLES DRAWN AEROBIC AND ANAEROBIC 5CC  Final   Culture NO GROWTH 3 DAYS  Final   Report Status PENDING  Incomplete  Urine culture     Status: None   Collection Time: 09/16/16  8:09 PM  Result Value Ref Range Status   Specimen Description URINE, RANDOM  Final   Special Requests NONE  Final   Culture NO GROWTH  Final   Report Status 09/18/2016 FINAL  Final  Culture, blood (routine x 2)     Status: None (Preliminary result)   Collection Time: 09/18/16  7:49 AM  Result Value Ref Range Status   Specimen Description BLOOD RIGHT WRIST  Final   Special Requests BOTTLES DRAWN AEROBIC ONLY 5CC  Final   Culture NO GROWTH 1 DAY  Final   Report Status PENDING  Incomplete  Culture, blood (routine x 2)     Status: None (Preliminary result)   Collection Time: 09/18/16  7:53 AM  Result Value Ref Range Status   Specimen Description BLOOD RIGHT HAND  Final   Special Requests BOTTLES DRAWN AEROBIC ONLY 5CC  Final   Culture NO GROWTH 1 DAY  Final   Report Status PENDING  Incomplete       Radiology Studies: No results found.    Scheduled Meds: . amoxicillin-clavulanate  1 tablet Oral Q12H  . ARIPiprazole  30 mg Oral Daily  . donepezil  10 mg Oral QHS  . feeding supplement (ENSURE ENLIVE)  237 mL Oral BID BM  . feeding supplement (PRO-STAT SUGAR FREE 64)  30 mL Oral BID  . gabapentin  100 mg Oral QHS  . mouth rinse  15 mL Mouth Rinse BID  . memantine  10 mg Oral BID  . multivitamin with minerals  1 tablet Oral Daily  . polyethylene glycol  17 g Oral BID  . risperiDONE  1 mg Oral QHS  . risperiDONE  2 mg Oral Q breakfast  . rivaroxaban  20 mg Oral Q supper   Continuous  Infusions:   LOS: 4 days    Time spent: 30 minutes   Noralee Stain,  DO [facility] [email or web address] Password [email or web address] 09/20/2016, 10:39 AM

## 2016-09-20 NOTE — Progress Notes (Signed)
Pt currently refusing AM lab draw, vital signs, and CBG.

## 2016-09-20 NOTE — Progress Notes (Signed)
Speech Language Pathology Treatment: Dysphagia  Patient Details Name: [name] MRN: [id number] DOB: 08/31/1951 Today's Date: 09/20/2016 Time: [id number] SLP Time Calculation (min) (ACUTE ONLY): 15 min  Assessment / Plan / Recommendation Clinical Impression  Skilled treatment session focused on addressing dysphagia goals. SLP facilitated session by positioning patient as upright as possible with Min physical assist for head positioning.  SLP also facilitated session with set-up and Max-Total assist for PO intake.  Of note, patient with intermittent weak congested cough at baseline today.  Patient also with effective mastication and oral clearance of Dys.3 textures and no immediate overt s/s of aspiration with thin liquid straw sips.  Weak intermittent cough present throughout session today; however, not suspected to be related to PO as it was present before trials.  Continue with current plan of care.     HPI HPI: [name] Martinetrnest Bergeris a 65 y.o.malewith dementia, lower extremity DVT and hematoma, sacral wound, chronic anemia and kidney disease who was recently admitted last month for sepsis from pneumonia was brought in the ER after patient's nursing facility found that patient has not been eating well for last 3 days and was febrile.  In the ER Patient was mildly febrile with leukocytosis. CT angiogram of the chest was negative for PE but does shows features concerning for pneumonia. RIGHT greater than LEFT tree-in-bud infiltrates. Heterogeneously enhancing small consolidation. Enhancing RIGHT middle lobe atelectasis. Prior MBS shows functional swallow though nectar thick liquids recommended given frequent shortness of breath with activity.       SLP Plan  Continue with current plan of care     Recommendations  Diet recommendations: Dysphagia 3 (mechanical soft);Thin liquid Liquids provided via: Cup;Straw Medication Administration: Whole meds with puree Supervision: Staff to assist with self  feeding;Full supervision/cueing for compensatory strategies Compensations: Slow rate;Small sips/bites;Minimize environmental distractions;Follow solids with liquid Postural Changes and/or Swallow Maneuvers: Seated upright 90 degrees;Upright 30-60 min after meal (position as up right as possible )                Oral Care Recommendations: Oral care BID Follow up Recommendations: Skilled Nursing facility;24 hour supervision/assistance Plan: Continue with current plan of care       GO              [name] FerrettiMelissa [name] [name] [phone]  [name] 09/20/2016, 3:18 PM

## 2016-09-20 NOTE — Care Management Important Message (Signed)
Important Message  Patient Details  Name: [name] MRN: [id number] Date of Birth: 07/29/1951   Medicare Important Message Given:  Yes    Kyla BalzarineShealy, Jaimya Feliciano Abena 09/20/2016, 9:56 AM

## 2016-09-21 DIAGNOSIS — F333 Major depressive disorder, recurrent, severe with psychotic symptoms: Secondary | ICD-10-CM | POA: Insufficient documentation

## 2016-09-21 DIAGNOSIS — R1314 Dysphagia, pharyngoesophageal phase: Secondary | ICD-10-CM | POA: Insufficient documentation

## 2016-09-21 DIAGNOSIS — G8929 Other chronic pain: Secondary | ICD-10-CM | POA: Insufficient documentation

## 2016-09-21 DIAGNOSIS — K5901 Slow transit constipation: Secondary | ICD-10-CM | POA: Insufficient documentation

## 2016-09-21 LAB — CBC WITH DIFFERENTIAL/PLATELET
Basophils Absolute: 0 10*3/uL (ref 0.0–0.1)
Basophils Relative: 0 %
EOS ABS: 0.2 10*3/uL (ref 0.0–0.7)
EOS PCT: 2 %
HCT: 25.7 % — ABNORMAL LOW (ref 39.0–52.0)
Hemoglobin: 8 g/dL — ABNORMAL LOW (ref 13.0–17.0)
LYMPHS ABS: 1.8 10*3/uL (ref 0.7–4.0)
LYMPHS PCT: 17 %
MCH: 27.2 pg (ref 26.0–34.0)
MCHC: 31.1 g/dL (ref 30.0–36.0)
MCV: 87.4 fL (ref 78.0–100.0)
MONO ABS: 0.8 10*3/uL (ref 0.1–1.0)
Monocytes Relative: 7 %
Neutro Abs: 7.6 10*3/uL (ref 1.7–7.7)
Neutrophils Relative %: 74 %
PLATELETS: 521 10*3/uL — AB (ref 150–400)
RBC: 2.94 MIL/uL — ABNORMAL LOW (ref 4.22–5.81)
RDW: 16.5 % — AB (ref 11.5–15.5)
WBC: 10.4 10*3/uL (ref 4.0–10.5)

## 2016-09-21 LAB — BASIC METABOLIC PANEL
Anion gap: 9 (ref 5–15)
BUN: 18 mg/dL (ref 6–20)
CHLORIDE: 100 mmol/L — AB (ref 101–111)
CO2: 30 mmol/L (ref 22–32)
CREATININE: 1.42 mg/dL — AB (ref 0.61–1.24)
Calcium: 9.5 mg/dL (ref 8.9–10.3)
GFR calc Af Amer: 59 mL/min — ABNORMAL LOW (ref >60)
GFR, EST NON AFRICAN AMERICAN: 51 mL/min — AB (ref >60)
GLUCOSE: 97 mg/dL (ref 65–99)
POTASSIUM: 4.6 mmol/L (ref 3.5–5.1)
Sodium: 139 mmol/L (ref 135–145)

## 2016-09-21 LAB — CULTURE, BLOOD (ROUTINE X 2): CULTURE: NO GROWTH

## 2016-09-21 LAB — GLUCOSE, CAPILLARY
GLUCOSE-CAPILLARY: 107 mg/dL — AB (ref 65–99)
Glucose-Capillary: 92 mg/dL (ref 65–99)
Glucose-Capillary: 132 mg/dL — ABNORMAL HIGH (ref 65–99)
Glucose-Capillary: 122 mg/dL — ABNORMAL HIGH (ref 65–99)

## 2016-09-21 MED ORDER — RISPERIDONE 1 MG PO TABS: 1.0000 mg | ORAL_TABLET | Freq: Every day | ORAL | 0 refills | Status: DC

## 2016-09-21 MED ORDER — RISPERIDONE 2 MG PO TABS: 2.0000 mg | ORAL_TABLET | Freq: Every day | ORAL | 0 refills | Status: DC

## 2016-09-21 MED ORDER — AMOXICILLIN-POT CLAVULANATE 875-125 MG PO TABS: 1.0000 | ORAL_TABLET | Freq: Two times a day (BID) | ORAL | 0 refills | Status: DC

## 2016-09-21 MED ORDER — LORAZEPAM 0.5 MG PO TABS: 0.5000 mg | ORAL_TABLET | Freq: Three times a day (TID) | ORAL | 0 refills | Status: DC | PRN

## 2016-09-21 MED ORDER — SODIUM CHLORIDE 0.9 % IV SOLN
INTRAVENOUS | Status: DC
  Administered 2016-09-21 – 2016-09-22 (×4): via INTRAVENOUS

## 2016-09-21 MED ORDER — FLUTICASONE PROPIONATE 50 MCG/ACT NA SUSP
2.0000 | Freq: Every day | NASAL | Status: DC
  Administered 2016-09-21 – 2016-09-23 (×3): 2 via NASAL
  Filled 2016-09-21: qty 16

## 2016-09-21 NOTE — Care Management Note (Signed)
Case Management Note Donn PieriniKristi Noga Fogg RN, BSN Unit 2W-Case Manager [phone]  Patient Details  Name: [name] Bakernest [name] MRN: [id number] Date of Birth: 11/08/1950  Subjective/Objective:    Pt admitted with HCAP                Action/Plan: [facility] CSW following for return to SNF when medically stable  Expected Discharge Date: 09/21/16               Expected Discharge Plan:  Skilled Nursing Facility  In-House Referral:  Clinical Social Work  Discharge planning Services  CM Consult  Post Acute Care Choice:    Choice offered to:     DME Arranged:    DME Agency:     HH Arranged:    HH Agency:     Status of Service:  Completed, signed off  If discussed at [employer] Length of Stay Meetings, dates discussed:    Discharge Disposition: skilled facility   Additional Comments:  Darrold SpanWebster, Navreet Bolda Hall, RN 09/21/2016, 11:25 AM

## 2016-09-21 NOTE — Progress Notes (Signed)
Clinical Social Worker facilitated patient discharge including contacting patient family and facility to confirm patient discharge plans.  Clinical information faxed to facility and family agreeable with plan.  [facility]  RN Misty StanleyLisa to call [phone]) prior to discharge.  Clinical Social Worker will sign off for now as social work intervention is no longer needed. Please consult us again if new need arises.  Marrianne MoodAshley Nicola Heinemann, MSW, [employer] [phone]

## 2016-09-21 NOTE — Progress Notes (Signed)
[facility]. Will try again.

## 2016-09-21 NOTE — Progress Notes (Signed)
CSW spoke to patient to let him know of discharge. CSW also spoke to Ms. Providence LaniusHowell patients guardian to let her know patient will be discharging today and returning [facility]. Ms. Providence LaniusHowell was agreeable to transfer.   Marrianne MoodAshley Clyde Zarrella, MSW,  [employer]WA [phone]

## 2016-09-21 NOTE — Progress Notes (Signed)
Heart rate is in the 130's. The physician has been notified. Stated that the plan would be to delay discharge for now to watch the heart rate. Will continue to monitor.

## 2016-09-21 NOTE — Discharge Summary (Addendum)
Physician Discharge Summary  [name] ZOX:[id number] DOB: 1951-06-03 DOA: 09/16/2016  PCP: Colon Branch, MD  Admit date: 09/16/2016 Discharge date: 09/21/2016  Admitted From: SNF, [facility] Disposition:  SNF, [facility]   Recommendations for Outpatient Follow-up:  1. Follow up with PCP in 1 week 2. Please obtain BMP/CBC in one week  3. Please follow up on the following pending results: final blood culture results. Sputum culture pending.   Home Health: No  Equipment/Devices: None   Discharge Condition: Stable CODE STATUS: DNR  Diet recommendations: Dysphagia 3 (mechanical soft);Thin liquid Liquids provided via: Cup;Straw Medication Administration: Whole meds with puree Supervision: Staff to assist with self feeding;Full supervision/cueing for compensatory strategies Compensations: Slow rate;Small sips/bites;Minimize environmental distractions;Follow solids with liquid Postural Changes and/or Swallow Maneuvers: Seated upright 90 degrees;Upright 30-60 min after meal (position as up right as possible )  Brief/Interim Summary: Simonne Martinet a 65 y.o.malewith dementia, lower extremity DVT and hematoma, sacral wound, chronic anemia and kidney disease who was recently admitted last month for sepsis from pneumonia was brought in the ER after patient's nursing facility found that patient has not been eating well for last 3 days and was febrile. CTA was negative for PE, but showed pneumonia. Patient was admitted for further treatment and care. He was treated for sepsis secondary to HCAP vs aspiration pneumonia in right lung base. He was evaluated by SLP during hospitalization. He was initially started on vanco/cefpime, which was deescalated to augmentin. He then had a fever and antibiotics were changed to zosyn, then deescalated again to augmentin. He has been afebrile and WBC normal over 24 hours.   Subjective on day of discharge: No change in status. Remains nonverbal, which per  previous notes, is baseline. He has been evaluated by PT . He has been intermittently refusing nursing care as well. Stable for discharge today.   Discharge Diagnoses:  Principal Problem:   HCAP (healthcare-associated pneumonia) Active Problems:   Hypertension   Dementia   DVT (deep venous thrombosis) (HCC)   CKD (chronic kidney disease) stage 3, GFR 30-59 ml/min   Mental retardation   Hematoma   Tachycardia   Pneumonia  Sepsis secondary to HCAP versus aspiration pneumonia in right lung base  -Influenza negative  -Blood cultures with 1 of 2 GPC, this is a contaminant. Repeat blood cultures negative to date. Follow up on final results as outpatient  -Sputum culture pending  -SLP evaluation: Dysphagia diet -Vanco/cefepime --> deescalated to Augmentin and had a fever, antibiotic switched to zosyn --> afebrile 24 hours and now switched back to Augmentin. Suspect main issue at hand is aspiration and will need to cover anaerobes. Now remains afebrile without leukocytosis. He will be discharged with Augmentin to finish total 14 day course of antibiotics.   History of DVT -Continue Xarelto  Chronic normocytic anemia  -Baseline Hgb 8 -Stable  CKD stage 3, not acute kidney injury -Baseline Cr 1.5-1.7 -Dehydrated on admission, IVF given. No acute component to kidney dysfunction.  -Stable  Dementia and mental retardation -Contonue Aricept, Namenda, Risperal  Sacral decub ulcer stage 2, POA -Wound team rec to continue silicone foam dressing, change every 3 days and PRN soilage. Add low air loss mattress for pressure redistribution.    Discharge Instructions  Discharge Instructions    Call MD for:  difficulty breathing, headache or visual disturbances    Complete by:  As directed    Call MD for:  temperature >100.4    Complete by:  As directed  Increase activity slowly    Complete by:  As directed        Medication List    TAKE these medications   acetaminophen 650  MG suppository Commonly known as:  TYLENOL Place 1 suppository (650 mg total) rectally every 6 (six) hours as needed for mild pain (or Fever >/= 101).   amoxicillin-clavulanate 875-125 MG tablet Commonly known as:  AUGMENTIN Take 1 tablet by mouth every 12 (twelve) hours.   ARIPiprazole 30 MG tablet Commonly known as:  ABILIFY Take 30 mg by mouth daily.   donepezil 10 MG tablet Commonly known as:  ARICEPT Take 10 mg by mouth at bedtime.   furosemide 20 MG tablet Commonly known as:  LASIX Take 1 tablet (20 mg total) by mouth 2 (two) times daily.   gabapentin 100 MG capsule Commonly known as:  NEURONTIN Take 100 mg by mouth at bedtime.   LORazepam 0.5 MG tablet Commonly known as:  ATIVAN Take 1 tablet (0.5 mg total) by mouth every 8 (eight) hours as needed (for agitation).   memantine 10 MG tablet Commonly known as:  NAMENDA Take 10 mg by mouth 2 (two) times daily.   polyethylene glycol packet Commonly known as:  MIRALAX / GLYCOLAX Take 17 g by mouth 2 (two) times daily.   potassium chloride 10 MEQ tablet Commonly known as:  K-DUR,KLOR-CON Take 10 mEq by mouth daily.   risperiDONE 1 MG tablet Commonly known as:  RISPERDAL Take 1 tablet (1 mg total) by mouth at bedtime. Reported on 10/25/2015   risperiDONE 2 MG tablet Commonly known as:  RISPERDAL Take 1 tablet (2 mg total) by mouth daily with breakfast.   rivaroxaban 20 MG Tabs tablet Commonly known as:  XARELTO Take 1 tablet (20 mg total) by mouth daily with supper.   senna-docusate 8.6-50 MG tablet Commonly known as:  Senokot-S Take 2 tablets by mouth at bedtime as needed for mild constipation or moderate constipation.   Vitamin D3 1000 units Caps Take 1 capsule by mouth 2 (two) times daily.       No Known Allergies  Consultations:  None   Procedures/Studies: Ct Angio Chest Pe W And/or Wo Contrast  Result Date: 09/16/2016 CLINICAL DATA:  Failure to thrive for 3 days. Tachycardia and tachypnea.  History of deep vein thrombosis. EXAM: CT ANGIOGRAPHY CHEST WITH CONTRAST TECHNIQUE: Multidetector CT imaging of the chest was performed using the standard protocol during bolus administration of intravenous contrast. Multiplanar CT image reconstructions and MIPs were obtained to evaluate the vascular anatomy. CONTRAST:  80 cc Isovue 370 COMPARISON:  Chest radiograph September 16, 2016 at 1651 hours FINDINGS: CARDIOVASCULAR: Adequate contrast opacification of the pulmonary artery's. Main pulmonary artery is not enlarged. No pulmonary arterial filling defects to the level of the subsegmental branches. Heart size is normal, no right heart strain. Mild coronary artery calcifications. No pericardial effusions. Thoracic aorta is normal course and caliber, LEFT vertebral artery arises directly from the aortic arch. MEDIASTINUM/NODES: No lymphadenopathy by CT size criteria. LUNGS/PLEURA: Tracheobronchial tree is patent, no pneumothorax. RIGHT greater than LEFT tree-in-bud infiltrates. Heterogeneously enhancing small consolidation. Enhancing RIGHT middle lobe atelectasis. Bilateral scattered linear atelectasis or scarring. Ex UPPER ABDOMEN: Included view of the abdomen is nonacute. Inferior vena cava filter at the junction of the RIGHT renal vein. Partially imaged RIGHT renal cyst measure least 5.7 cm. Moderate amount of retained large bowel stool. Subcentimeter presumed cyst in dome of the liver. MUSCULOSKELETAL: Elevated RIGHT hemidiaphragm. Visualized soft tissues and included osseous  structure are s nonsuspicious. Review of the MIP images confirms the above findings. IMPRESSION: No acute pulmonary embolism. Bilateral tree-in-bud infiltrates may be infectious or inflammatory. RIGHT lung base enhancing atelectasis, possible superimposed pneumonia. Electronically Signed   By: Awilda Metro M.D.   On: 09/16/2016 23:52   Dg Chest Port 1 View  Result Date: 09/16/2016 CLINICAL DATA:  Fever for 3 days EXAM: PORTABLE CHEST  1 VIEW COMPARISON:  None. FINDINGS: Bilateral airspace disease has improved. There continues to be elevation of the right hemidiaphragm with bibasilar atelectasis. Normal heart size. No pneumothorax. IMPRESSION: Improved bilateral airspace disease.  Bibasilar atelectasis. Electronically Signed   By: Jolaine Click M.D.   On: 09/16/2016 17:05   Dg Chest Port 1 View  Result Date: 08/27/2016 CLINICAL DATA:  Shortness of breath EXAM: PORTABLE CHEST 1 VIEW COMPARISON:  08/26/2016 and multiple previous FINDINGS: Bilateral pulmonary infiltrates persist, similar to yesterday's study but improved compared to the study of 11/13. No worsening or new finding. No visible effusion. IMPRESSION: Persistent bilateral pulmonary infiltrates, unchanged since yesterday. Electronically Signed   By: Paulina Fusi M.D.   On: 08/27/2016 09:50   Dg Chest Port 1 View  Result Date: 08/26/2016 CLINICAL DATA:  Pneumonia EXAM: PORTABLE CHEST 1 VIEW COMPARISON:  08/23/2016 FINDINGS: Diffuse bilateral airspace disease has improved in the interval. Bibasilar atelectasis also improved. Right pleural effusion improved. Findings most consistent with clearing heart failure or edema. Gas shadow overlying the right heart was not present on prior studies and is of uncertain etiology. This could be in the esophagus or hiatal hernia however is not seen on prior studies. If the patient has had acute changes symptoms, consider chest CT to determine the localization of this gas. IMPRESSION: Interval improvement in bilateral airspace disease. Improvement in bibasilar atelectasis and right effusion Gas shadow overlying the right heart border of uncertain etiology. Follow-up recommended. CT if the patient has had acute interval change. Electronically Signed   By: Marlan Palau M.D.   On: 08/26/2016 09:17   Dg Chest Port 1 View  Result Date: 08/23/2016 CLINICAL DATA:  Pneumonia EXAM: PORTABLE CHEST 1 VIEW COMPARISON:  08/22/2016 FINDINGS: Extensive  bilateral predominately central and basilar airspace disease is worse. Right pleural effusion is suspected as the right hemidiaphragm is obscured. Heart is prominent. No pneumothorax. IMPRESSION: Worsening bilateral airspace disease. Right pleural effusion is suspected. Electronically Signed   By: Jolaine Click M.D.   On: 08/23/2016 07:23   Dg Swallowing Func-speech Pathology  Result Date: 08/29/2016 Objective Swallowing Evaluation: Type of Study: MBS-Modified Barium Swallow Study Patient Details Name: Derico Mitton MRN: [id number] Date of Birth: 02-08-1951 Today's Date: 08/29/2016 Time: SLP Start Time (ACUTE ONLY): 1015-SLP Stop Time (ACUTE ONLY): 1047 SLP Time Calculation (min) (ACUTE ONLY): 32 min Past Medical History: Past Medical History: Diagnosis Date . Dementia 06/19/2014 . DVT (deep venous thrombosis) (HCC)   right leg . Dyslipidemia  . Hypertension  . Mental retardation  . Obesity  . Other and unspecified hyperlipidemia 06/19/2014 Past Surgical History: No past surgical history on file. HPI: 65 y.o.BM PMHxSevere Mental Handicap(nonverbal at baseline), HTN, DVT on Xarelto, CKD, Mild Chronic Diastolic CHF. Who presented with cough and severe weakness. EMS found him lethargic, congested, and hypoxic at 88% on room air. In the ED he was found to be hypothermic and CXR showed atelectasis vs opacity in the right base. He has been admitted twice in the past year under similar circumstances. The first time in January he had mild AKI, and  improved with fluids. The second time in March he was hypothermic but without any focal signs of infection and his TSH, cortisol, and lactate were normal as was a flu swab. He was given fluids and he returned completely to normal with just supportive care. Clinically remains in a precarious situation with persisting hypotension and intermittent severe hypoxemia, continue conservative medical care as per Dr. Sharon Seller discussion with his power of attorney, not a candidate for  BiPAP given his heavy secretions and his inability to control them on his own. Physiotherapy vest QID.  Subjective: alert, min verbal interaction Assessment / Plan / Recommendation CHL IP CLINICAL IMPRESSIONS 08/29/2016 Therapy Diagnosis Mild pharyngeal phase dysphagia Clinical Impression Patient presents with a functional oropharyngeal swallow. Swallow initiation mildly delayed as a result of decreased coordination of breath and swallow, which with thin liquids leads to intermittent flash penetration. Oral manipulation of bolus and pharyngeal strength WFL. Although no frank penetration or aspiration noted, recommend initiation of conservative diet to maximize energy conservation and decrease aspiration risk in the setting of respiratory dysfunction as patient with frequent increase in WOB with any activity. Will f/u for tolerance at bedside as well as potential to advance diet with improved respiratory stability. Pateint should not require a f/u MBS to advance diet.   Impact on safety and function Moderate aspiration risk   CHL IP TREATMENT RECOMMENDATION 08/29/2016 Treatment Recommendations Therapy as outlined in treatment plan below   Prognosis 08/29/2016 Prognosis for Safe Diet Advancement Good Barriers to Reach Goals Cognitive deficits Barriers/Prognosis Comment -- CHL IP DIET RECOMMENDATION 08/29/2016 SLP Diet Recommendations Dysphagia 2 (Fine chop) solids;Nectar thick liquid Liquid Administration via Cup;Straw Medication Administration Whole meds with puree Compensations Slow rate;Small sips/bites Postural Changes Seated upright at 90 degrees   CHL IP OTHER RECOMMENDATIONS 08/29/2016 Recommended Consults -- Oral Care Recommendations Oral care BID Other Recommendations Order thickener from pharmacy;Prohibited food (jello, ice cream, thin soups);Remove water pitcher   CHL IP FOLLOW UP RECOMMENDATIONS 08/29/2016 Follow up Recommendations None   CHL IP FREQUENCY AND DURATION 08/29/2016 Speech Therapy Frequency  (ACUTE ONLY) min 2x/week Treatment Duration 2 weeks      CHL IP ORAL PHASE 08/29/2016 Oral Phase WFL Oral - Pudding Teaspoon -- Oral - Pudding Cup -- Oral - Honey Teaspoon -- Oral - Honey Cup -- Oral - Nectar Teaspoon -- Oral - Nectar Cup -- Oral - Nectar Straw -- Oral - Thin Teaspoon -- Oral - Thin Cup -- Oral - Thin Straw -- Oral - Puree -- Oral - Mech Soft -- Oral - Regular -- Oral - Multi-Consistency -- Oral - Pill -- Oral Phase - Comment --  CHL IP PHARYNGEAL PHASE 08/29/2016 Pharyngeal Phase Impaired Pharyngeal- Pudding Teaspoon -- Pharyngeal -- Pharyngeal- Pudding Cup -- Pharyngeal -- Pharyngeal- Honey Teaspoon -- Pharyngeal -- Pharyngeal- Honey Cup -- Pharyngeal -- Pharyngeal- Nectar Teaspoon Delayed swallow initiation-vallecula Pharyngeal -- Pharyngeal- Nectar Cup -- Pharyngeal -- Pharyngeal- Nectar Straw Delayed swallow initiation-vallecula Pharyngeal -- Pharyngeal- Thin Teaspoon Delayed swallow initiation-vallecula Pharyngeal -- Pharyngeal- Thin Cup Delayed swallow initiation-pyriform sinuses;Penetration/Aspiration before swallow Pharyngeal Material enters airway, remains ABOVE vocal cords then ejected out Pharyngeal- Thin Straw Delayed swallow initiation-pyriform sinuses;Penetration/Aspiration before swallow Pharyngeal Material enters airway, remains ABOVE vocal cords then ejected out Pharyngeal- Puree Delayed swallow initiation-vallecula Pharyngeal -- Pharyngeal- Mechanical Soft Delayed swallow initiation-vallecula Pharyngeal -- Pharyngeal- Regular -- Pharyngeal -- Pharyngeal- Multi-consistency -- Pharyngeal -- Pharyngeal- Pill -- Pharyngeal -- Pharyngeal Comment --  CHL IP CERVICAL ESOPHAGEAL PHASE 08/29/2016 Cervical Esophageal Phase WFL Pudding Teaspoon --  Pudding Cup -- Honey Teaspoon -- Honey Cup -- Nectar Teaspoon -- Nectar Cup -- Nectar Straw -- Thin Teaspoon -- Thin Cup -- Thin Straw -- Puree -- Mechanical Soft -- Regular -- Multi-consistency -- Pill -- Cervical Esophageal Comment -- Ferdinand LangoLeah McCoy  MA, CCC-SLP [phone] McCoy Leah Meryl 08/29/2016, 11:01 AM                  Discharge Exam: Vitals:   09/20/16 2028 09/21/16 0511  BP: 100/64 103/70  Pulse: 82   Resp: (!) 22 20  Temp: 98.1 F (36.7 C) 98.8 F (37.1 C)   Vitals:   09/20/16 0534 09/20/16 1300 09/20/16 2028 09/21/16 0511  BP:  (!) 99/59 100/64 103/70  Pulse:  68 82   Resp:  18 (!) 22 20  Temp:   98.1 F (36.7 C) 98.8 F (37.1 C)  TempSrc:   Oral Axillary  SpO2:  100% 100% 99%  Weight: 93.9 kg (207 lb 1.6 oz)     Height:        General: Pt is alert, awake, not in acute distress, nonverbal  Cardiovascular: RRR, S1/S2 +, no rubs, no gallops Respiratory: CTA bilaterally, no wheezing, no rhonchi Abdominal: Soft, NT, ND, bowel sounds + Extremities: no edema, no cyanosis    The results of significant diagnostics from this hospitalization (including imaging, microbiology, ancillary and laboratory) are listed below for reference.     Microbiology: Recent Results (from the past 240 hour(s))  Blood Culture (routine x 2)     Status: Abnormal   Collection Time: 09/16/16  4:48 PM  Result Value Ref Range Status   Specimen Description BLOOD RIGHT WRIST  Final   Special Requests BOTTLES DRAWN AEROBIC AND ANAEROBIC 10CC  Final   Culture  Setup Time   Final    GRAM POSITIVE COCCI IN CLUSTERS ANAEROBIC BOTTLE ONLY CRITICAL RESULT CALLED TO, READ BACK BY AND VERIFIED WITH: Kelby FamF WILSON [facility] LLCHARMD 2155 09/17/16 A BROWNING    Culture (A)  Final    STAPHYLOCOCCUS SPECIES (COAGULASE NEGATIVE) THE SIGNIFICANCE OF ISOLATING THIS ORGANISM FROM A SINGLE SET OF BLOOD CULTURES WHEN MULTIPLE SETS ARE DRAWN IS UNCERTAIN. PLEASE NOTIFY THE MICROBIOLOGY DEPARTMENT WITHIN ONE WEEK IF SPECIATION AND SENSITIVITIES ARE REQUIRED.    Report Status 09/19/2016 FINAL  Final  Blood Culture ID Panel (Reflexed)     Status: None   Collection Time: 09/16/16  4:48 PM  Result Value Ref Range Status   Enterococcus species NOT DETECTED NOT DETECTED  Final   Listeria monocytogenes NOT DETECTED NOT DETECTED Final   Staphylococcus species NOT DETECTED NOT DETECTED Final   Staphylococcus aureus NOT DETECTED NOT DETECTED Final   Streptococcus species NOT DETECTED NOT DETECTED Final   Streptococcus agalactiae NOT DETECTED NOT DETECTED Final   Streptococcus pneumoniae NOT DETECTED NOT DETECTED Final   Streptococcus pyogenes NOT DETECTED NOT DETECTED Final   Acinetobacter baumannii NOT DETECTED NOT DETECTED Final   Enterobacteriaceae species NOT DETECTED NOT DETECTED Final   Enterobacter cloacae complex NOT DETECTED NOT DETECTED Final   Escherichia coli NOT DETECTED NOT DETECTED Final   Klebsiella oxytoca NOT DETECTED NOT DETECTED Final   Klebsiella pneumoniae NOT DETECTED NOT DETECTED Final   Proteus species NOT DETECTED NOT DETECTED Final   Serratia marcescens NOT DETECTED NOT DETECTED Final   Haemophilus influenzae NOT DETECTED NOT DETECTED Final   Neisseria meningitidis NOT DETECTED NOT DETECTED Final   Pseudomonas aeruginosa NOT DETECTED NOT DETECTED Final   Candida albicans NOT DETECTED NOT DETECTED  Final   Candida glabrata NOT DETECTED NOT DETECTED Final   Candida krusei NOT DETECTED NOT DETECTED Final   Candida parapsilosis NOT DETECTED NOT DETECTED Final   Candida tropicalis NOT DETECTED NOT DETECTED Final  Blood Culture (routine x 2)     Status: None (Preliminary result)   Collection Time: 09/16/16  5:02 PM  Result Value Ref Range Status   Specimen Description BLOOD RIGHT HAND  Final   Special Requests BOTTLES DRAWN AEROBIC AND ANAEROBIC 5CC  Final   Culture NO GROWTH 4 DAYS  Final   Report Status PENDING  Incomplete  Urine culture     Status: None   Collection Time: 09/16/16  8:09 PM  Result Value Ref Range Status   Specimen Description URINE, RANDOM  Final   Special Requests NONE  Final   Culture NO GROWTH  Final   Report Status 09/18/2016 FINAL  Final  Culture, blood (routine x 2)     Status: None (Preliminary result)    Collection Time: 09/18/16  7:49 AM  Result Value Ref Range Status   Specimen Description BLOOD RIGHT WRIST  Final   Special Requests BOTTLES DRAWN AEROBIC ONLY 5CC  Final   Culture NO GROWTH 2 DAYS  Final   Report Status PENDING  Incomplete  Culture, blood (routine x 2)     Status: None (Preliminary result)   Collection Time: 09/18/16  7:53 AM  Result Value Ref Range Status   Specimen Description BLOOD RIGHT HAND  Final   Special Requests BOTTLES DRAWN AEROBIC ONLY 5CC  Final   Culture NO GROWTH 2 DAYS  Final   Report Status PENDING  Incomplete     Labs: BNP (last 3 results)  Recent Labs  10/25/15 2033  BNP 8.3   Basic Metabolic Panel:  Recent Labs Lab 09/16/16 1702 09/17/16 0321 09/18/16 0328 09/19/16 0253 09/20/16 0811  NA 139 139 141 140 139  K 4.5 4.2 4.1 4.1 4.2  CL 101 102 105 104 101  CO2 29 29 28 29 29   GLUCOSE 102* 108* 87 95 90  BUN 30* 26* 20 19 20   CREATININE 1.78* 1.55* 1.37* 1.41* 1.50*  CALCIUM 9.3 9.1 9.0 9.1 9.2   Liver Function Tests:  Recent Labs Lab 09/16/16 1702 09/17/16 0321  AST 28 21  ALT 24 21  ALKPHOS 96 86  BILITOT 0.5 0.5  PROT 7.1 6.7  ALBUMIN 2.3* 2.1*   No results for input(s): LIPASE, AMYLASE in the last 168 hours. No results for input(s): AMMONIA in the last 168 hours. CBC:  Recent Labs Lab 09/16/16 1702 09/17/16 0321 09/18/16 0328 09/19/16 0253 09/20/16 0811  WBC 11.4* 10.6* 9.2 8.6 9.3  NEUTROABS 8.0* 7.3 6.2 5.8 6.8  HGB 8.6* 7.9* 7.3* 7.9* 7.7*  HCT 26.4* 24.9* 23.1* 25.2* 24.1*  MCV 85.4 86.2 86.8 87.2 88.0  PLT 523* 479* 464* 479* 462*   Cardiac Enzymes: No results for input(s): CKTOTAL, CKMB, CKMBINDEX, TROPONINI in the last 168 hours. BNP: Invalid input(s): POCBNP CBG:  Recent Labs Lab 09/19/16 2134 09/20/16 1122 09/20/16 1801 09/20/16 2034 09/21/16 0535  GLUCAP 116* 95 94 97 92   D-Dimer No results for input(s): DDIMER in the last 72 hours. Hgb A1c No results for input(s): HGBA1C in  the last 72 hours. Lipid Profile No results for input(s): CHOL, HDL, LDLCALC, TRIG, CHOLHDL, LDLDIRECT in the last 72 hours. Thyroid function studies No results for input(s): TSH, T4TOTAL, T3FREE, THYROIDAB in the last 72 hours.  Invalid input(s): FREET3  Anemia work up No results for input(s): VITAMINB12, FOLATE, FERRITIN, TIBC, IRON, RETICCTPCT in the last 72 hours. Urinalysis    Component Value Date/Time   COLORURINE YELLOW 09/16/2016 2009   APPEARANCEUR CLEAR 09/16/2016 2009   LABSPEC 1.013 09/16/2016 2009   PHURINE 5.0 09/16/2016 2009   GLUCOSEU NEGATIVE 09/16/2016 2009   HGBUR NEGATIVE 09/16/2016 2009   BILIRUBINUR NEGATIVE 09/16/2016 2009   KETONESUR NEGATIVE 09/16/2016 2009   PROTEINUR NEGATIVE 09/16/2016 2009   UROBILINOGEN 0.2 06/18/2014 1147   NITRITE NEGATIVE 09/16/2016 2009   LEUKOCYTESUR NEGATIVE 09/16/2016 2009   Sepsis Labs Invalid input(s): PROCALCITONIN,  WBC,  LACTICIDVEN Microbiology Recent Results (from the past 240 hour(s))  Blood Culture (routine x 2)     Status: Abnormal   Collection Time: 09/16/16  4:48 PM  Result Value Ref Range Status   Specimen Description BLOOD RIGHT WRIST  Final   Special Requests BOTTLES DRAWN AEROBIC AND ANAEROBIC 10CC  Final   Culture  Setup Time   Final    GRAM POSITIVE COCCI IN CLUSTERS ANAEROBIC BOTTLE ONLY CRITICAL RESULT CALLED TO, READ BACK BY AND VERIFIED WITH: Kelby Fam PHARMD 2155 09/17/16 A BROWNING    Culture (A)  Final    STAPHYLOCOCCUS SPECIES (COAGULASE NEGATIVE) THE SIGNIFICANCE OF ISOLATING THIS ORGANISM FROM A SINGLE SET OF BLOOD CULTURES WHEN MULTIPLE SETS ARE DRAWN IS UNCERTAIN. PLEASE NOTIFY THE MICROBIOLOGY DEPARTMENT WITHIN ONE WEEK IF SPECIATION AND SENSITIVITIES ARE REQUIRED.    Report Status 09/19/2016 FINAL  Final  Blood Culture ID Panel (Reflexed)     Status: None   Collection Time: 09/16/16  4:48 PM  Result Value Ref Range Status   Enterococcus species NOT DETECTED NOT DETECTED Final   Listeria  monocytogenes NOT DETECTED NOT DETECTED Final   Staphylococcus species NOT DETECTED NOT DETECTED Final   Staphylococcus aureus NOT DETECTED NOT DETECTED Final   Streptococcus species NOT DETECTED NOT DETECTED Final   Streptococcus agalactiae NOT DETECTED NOT DETECTED Final   Streptococcus pneumoniae NOT DETECTED NOT DETECTED Final   Streptococcus pyogenes NOT DETECTED NOT DETECTED Final   Acinetobacter baumannii NOT DETECTED NOT DETECTED Final   Enterobacteriaceae species NOT DETECTED NOT DETECTED Final   Enterobacter cloacae complex NOT DETECTED NOT DETECTED Final   Escherichia coli NOT DETECTED NOT DETECTED Final   Klebsiella oxytoca NOT DETECTED NOT DETECTED Final   Klebsiella pneumoniae NOT DETECTED NOT DETECTED Final   Proteus species NOT DETECTED NOT DETECTED Final   Serratia marcescens NOT DETECTED NOT DETECTED Final   Haemophilus influenzae NOT DETECTED NOT DETECTED Final   Neisseria meningitidis NOT DETECTED NOT DETECTED Final   Pseudomonas aeruginosa NOT DETECTED NOT DETECTED Final   Candida albicans NOT DETECTED NOT DETECTED Final   Candida glabrata NOT DETECTED NOT DETECTED Final   Candida krusei NOT DETECTED NOT DETECTED Final   Candida parapsilosis NOT DETECTED NOT DETECTED Final   Candida tropicalis NOT DETECTED NOT DETECTED Final  Blood Culture (routine x 2)     Status: None (Preliminary result)   Collection Time: 09/16/16  5:02 PM  Result Value Ref Range Status   Specimen Description BLOOD RIGHT HAND  Final   Special Requests BOTTLES DRAWN AEROBIC AND ANAEROBIC 5CC  Final   Culture NO GROWTH 4 DAYS  Final   Report Status PENDING  Incomplete  Urine culture     Status: None   Collection Time: 09/16/16  8:09 PM  Result Value Ref Range Status   Specimen Description URINE, RANDOM  Final   Special  Requests NONE  Final   Culture NO GROWTH  Final   Report Status 09/18/2016 FINAL  Final  Culture, blood (routine x 2)     Status: None (Preliminary result)   Collection  Time: 09/18/16  7:49 AM  Result Value Ref Range Status   Specimen Description BLOOD RIGHT WRIST  Final   Special Requests BOTTLES DRAWN AEROBIC ONLY 5CC  Final   Culture NO GROWTH 2 DAYS  Final   Report Status PENDING  Incomplete  Culture, blood (routine x 2)     Status: None (Preliminary result)   Collection Time: 09/18/16  7:53 AM  Result Value Ref Range Status   Specimen Description BLOOD RIGHT HAND  Final   Special Requests BOTTLES DRAWN AEROBIC ONLY 5CC  Final   Culture NO GROWTH 2 DAYS  Final   Report Status PENDING  Incomplete     Time coordinating discharge: Over 30 minutes  SIGNED:  Noralee StainJennifer Rance Smithson, DO [facility] Pager [phone]  If 7PM-7AM, please contact night-coverage [email or web address] Password [email or web address] 09/21/2016, 8:08 AM

## 2016-09-22 ENCOUNTER — Inpatient Hospital Stay (HOSPITAL_COMMUNITY): Payer: Medicare Other

## 2016-09-22 DIAGNOSIS — N183 Chronic kidney disease, stage 3 (moderate): Secondary | ICD-10-CM

## 2016-09-22 DIAGNOSIS — J189 Pneumonia, unspecified organism: Secondary | ICD-10-CM

## 2016-09-22 DIAGNOSIS — I10 Essential (primary) hypertension: Secondary | ICD-10-CM

## 2016-09-22 DIAGNOSIS — I82501 Chronic embolism and thrombosis of unspecified deep veins of right lower extremity: Secondary | ICD-10-CM

## 2016-09-22 DIAGNOSIS — T148XXA Other injury of unspecified body region, initial encounter: Secondary | ICD-10-CM

## 2016-09-22 DIAGNOSIS — F0150 Vascular dementia without behavioral disturbance: Secondary | ICD-10-CM

## 2016-09-22 LAB — CBC WITH DIFFERENTIAL/PLATELET
BASOS ABS: 0 10*3/uL (ref 0.0–0.1)
BASOS PCT: 0 %
Eosinophils Absolute: 0.1 10*3/uL (ref 0.0–0.7)
Eosinophils Relative: 1 %
HEMATOCRIT: 24.5 % — AB (ref 39.0–52.0)
HEMOGLOBIN: 7.6 g/dL — AB (ref 13.0–17.0)
Lymphocytes Relative: 20 %
Lymphs Abs: 1.9 10*3/uL (ref 0.7–4.0)
MCH: 26.9 pg (ref 26.0–34.0)
MCHC: 31 g/dL (ref 30.0–36.0)
MCV: 86.6 fL (ref 78.0–100.0)
Monocytes Absolute: 0.7 10*3/uL (ref 0.1–1.0)
Monocytes Relative: 7 %
NEUTROS ABS: 7 10*3/uL (ref 1.7–7.7)
NEUTROS PCT: 72 %
Platelets: 496 10*3/uL — ABNORMAL HIGH (ref 150–400)
RBC: 2.83 MIL/uL — ABNORMAL LOW (ref 4.22–5.81)
RDW: 16.5 % — AB (ref 11.5–15.5)
WBC: 9.7 10*3/uL (ref 4.0–10.5)

## 2016-09-22 LAB — GLUCOSE, CAPILLARY
GLUCOSE-CAPILLARY: 92 mg/dL (ref 65–99)
Glucose-Capillary: 117 mg/dL — ABNORMAL HIGH (ref 65–99)

## 2016-09-22 LAB — BASIC METABOLIC PANEL
ANION GAP: 7 (ref 5–15)
BUN: 17 mg/dL (ref 6–20)
CALCIUM: 9.1 mg/dL (ref 8.9–10.3)
CHLORIDE: 102 mmol/L (ref 101–111)
CO2: 30 mmol/L (ref 22–32)
Creatinine, Ser: 1.38 mg/dL — ABNORMAL HIGH (ref 0.61–1.24)
GFR calc non Af Amer: 53 mL/min — ABNORMAL LOW (ref >60)
Glucose, Bld: 95 mg/dL (ref 65–99)
Potassium: 4.5 mmol/L (ref 3.5–5.1)
Sodium: 139 mmol/L (ref 135–145)

## 2016-09-22 MED ORDER — LEVOFLOXACIN 750 MG PO TABS
750.0000 mg | ORAL_TABLET | Freq: Every day | ORAL | Status: DC
  Administered 2016-09-22 – 2016-09-23 (×2): 750 mg via ORAL
  Filled 2016-09-22 (×2): qty 1

## 2016-09-22 NOTE — Progress Notes (Signed)
Pharmacy Antibiotic Note  [name] is a 65 y.o. male admitted on 09/16/2016 with pneumonia.  Pharmacy has been consulted for levaquin dosing. WBC WNL and afebrile. No signs or reason to suspect the patient aspirated.  Plan: Levaquin 750mg  PO QD F/U length of therapy  Height: 6' (182.9 cm) Weight: 211 lb 1.6 oz (95.8 kg) IBW/kg (Calculated) : 77.6  Temp (24hrs), Avg:98.9 F (37.2 C), Min:98.4 F (36.9 C), Max:99.3 F (37.4 C)   Recent Labs Lab 09/16/16 1716 09/16/16 2011  09/18/16 0328 09/19/16 0253 09/20/16 0811 09/21/16 0753 09/22/16 0244  WBC  --   --   < > 9.2 8.6 9.3 10.4 9.7  CREATININE  --   --   < > 1.37* 1.41* 1.50* 1.42* 1.38*  LATICACIDVEN 0.87 0.64  --   --   --   --   --   --   < > = values in this interval not displayed.  Estimated Creatinine Clearance: 65.8 mL/min (by C-G formula based on SCr of 1.38 mg/dL (H)).    No Known Allergies  Antimicrobials this admission:  12/8 Cefepime >> 12/9 12/8 Vanc >> 12/9 12/11 Augmentin >>12/13 12/13 Levaquin>>  Microbiology results:  12/7 blood x 2>> 1/2 gpc in clusters 12/7 urine >> negative final 12/9 blood>> pending  Thank you for allowing pharmacy to be a part of this patient's care.  Toniann Failony L Ande Therrell 09/22/2016 11:45 AM

## 2016-09-22 NOTE — Progress Notes (Signed)
PROGRESS NOTE    [name]  ZOX:[id number] DOB: Mar 03, 1951 DOA: 09/16/2016 PCP: Colon Branch, MD     Brief Narrative:  [name] is a 65 y.o. male with dementia, lower extremity DVT and hematoma, sacral wound, chronic anemia and kidney disease who was recently admitted last month for sepsis from pneumonia was brought in the ER after patient's nursing facility found that patient has not been eating well for last 3 days and was febrile. No fever documented in Er.  CTA was negative for PE, but showed "Bilateral tree-in-bud infiltrates may be infectious or inflammatory andRIGHT lung base enhancing atelectasis, possible superimposed pneumonia."  Patient was admitted for further treatment and care.   Assessment & Plan:   Principal Problem:   HCAP (healthcare-associated pneumonia) Active Problems:   Hypertension   Dementia   DVT (deep venous thrombosis) (HCC)   CKD (chronic kidney disease) stage 3, GFR 30-59 ml/min   Mental retardation   Hematoma   Tachycardia   Pneumonia   Sepsis secondary to HCAP  - acute respiratory failure - no PE on CT- "Bilateral tree-in-bud infiltrates may be infectious or inflammatory andRIGHT lung base enhancing atelectasis, possible superimposed pneumonia." - discharged on 11/28 after treatment for pneumococcal pneumonia and + rhinovirus nasal swab - has not improved with Vanc/Cefepime and then Augmentin - still with low grade fever of 99, hypoxia, elevated HR - no signs of aspiration on SLP evals- switch to Levaquin - p Ox still 89-90% on room air- repeat CXR ` -Influenza negative  -Blood cultures with 1 of 2 GPC, likely a contaminant. Repeat blood cultures negative to date.   Anemia - AOCD per anemia panel on 12/09/15 - at baseline in 7-8 range  History of DVT -Continue Xarelto  Chronic normocytic anemia  -Baseline Hgb 8 -Stable  CKD stage 3 -Baseline Cr 1.5-1.7 -Stable  Dementia and mental retardation -Contonue Aricept, Namenda,  Risperal  Sacral decub ulcer stage 2, POA -Wound team rec to continue silicone foam dressing, change every 3 days and PRN soilage. Add low air loss mattress for pressure redistribution.    DVT prophylaxis: Xarelto Code Status: DNR Family Communication: Dr Alvino Chapel spoke with legal guardian  On 12/11 Disposition Plan: pending further improvement, back to SNF     Consultants:   None  Procedures:   None  Antimicrobials:   Vanco/cefepime 12/8-12/9  Augmentin 12/9-12/10  Zosyn 12/10-12/11  Augmentin 12/11 >>>12/13  Levaquin 12/13   Subjective: Alert, no complaints.   Objective: Vitals:   09/21/16 1408 09/21/16 2046 09/22/16 0516 09/22/16 1208  BP: 108/66 109/71 105/66 100/60  Pulse: (!) 134 (!) 110 96 91  Resp:  18 20 19   Temp:  99.3 F (37.4 C) 99.1 F (37.3 C) 99.1 F (37.3 C)  TempSrc:  Axillary Oral Oral  SpO2:  100% 100% 92%  Weight:   95.8 kg (211 lb 1.6 oz)   Height:        Intake/Output Summary (Last 24 hours) at 09/22/16 1603 Last data filed at 09/22/16 0840  Gross per 24 hour  Intake              360 ml  Output              300 ml  Net               60 ml   Filed Weights   09/19/16 0632 09/20/16 0534 09/22/16 0516  Weight: 96.2 kg (212 lb 1.3 oz) 93.9 kg (207 lb 1.6 oz)  95.8 kg (211 lb 1.6 oz)    Examination:  General exam: not in distress Respiratory system: Clear to auscultation anteriorly. Respiratory effort normal. Cardiovascular system: S1 & S2 heard, RRR. No JVD, murmurs, rubs, gallops or clicks. No pedal edema. Gastrointestinal system: Abdomen is nondistended, soft and nontender. No organomegaly or masses felt. Normal bowel sounds heard. Extremities: no cyanosis clubbing or edema Skin: dry scaly skin in extremities   Data Reviewed: I have personally reviewed following labs and imaging studies  CBC:  Recent Labs Lab 09/18/16 0328 09/19/16 0253 09/20/16 0811 09/21/16 0753 09/22/16 0244  WBC 9.2 8.6 9.3 10.4 9.7  NEUTROABS 6.2  5.8 6.8 7.6 7.0  HGB 7.3* 7.9* 7.7* 8.0* 7.6*  HCT 23.1* 25.2* 24.1* 25.7* 24.5*  MCV 86.8 87.2 88.0 87.4 86.6  PLT 464* 479* 462* 521* 496*   Basic Metabolic Panel:  Recent Labs Lab 09/18/16 0328 09/19/16 0253 09/20/16 0811 09/21/16 0753 09/22/16 0244  NA 141 140 139 139 139  K 4.1 4.1 4.2 4.6 4.5  CL 105 104 101 100* 102  CO2 28 29 29 30 30   GLUCOSE 87 95 90 97 95  BUN 20 19 20 18 17   CREATININE 1.37* 1.41* 1.50* 1.42* 1.38*  CALCIUM 9.0 9.1 9.2 9.5 9.1   GFR: Estimated Creatinine Clearance: 65.8 mL/min (by C-G formula based on SCr of 1.38 mg/dL (H)). Liver Function Tests:  Recent Labs Lab 09/16/16 1702 09/17/16 0321  AST 28 21  ALT 24 21  ALKPHOS 96 86  BILITOT 0.5 0.5  PROT 7.1 6.7  ALBUMIN 2.3* 2.1*   No results for input(s): LIPASE, AMYLASE in the last 168 hours. No results for input(s): AMMONIA in the last 168 hours. Coagulation Profile: No results for input(s): INR, PROTIME in the last 168 hours. Cardiac Enzymes: No results for input(s): CKTOTAL, CKMB, CKMBINDEX, TROPONINI in the last 168 hours. BNP (last 3 results) No results for input(s): PROBNP in the last 8760 hours. HbA1C: No results for input(s): HGBA1C in the last 72 hours. CBG:  Recent Labs Lab 09/21/16 0535 09/21/16 1121 09/21/16 1709 09/21/16 2306 09/22/16 0624  GLUCAP 92 132* 122* 107* 92   Lipid Profile: No results for input(s): CHOL, HDL, LDLCALC, TRIG, CHOLHDL, LDLDIRECT in the last 72 hours. Thyroid Function Tests: No results for input(s): TSH, T4TOTAL, FREET4, T3FREE, THYROIDAB in the last 72 hours. Anemia Panel: No results for input(s): VITAMINB12, FOLATE, FERRITIN, TIBC, IRON, RETICCTPCT in the last 72 hours. Sepsis Labs:  Recent Labs Lab 09/16/16 1702 09/16/16 1716 09/16/16 2011  PROCALCITON 0.19  --   --   LATICACIDVEN  --  0.87 0.64    Recent Results (from the past 240 hour(s))  Blood Culture (routine x 2)     Status: Abnormal   Collection Time: 09/16/16  4:48  PM  Result Value Ref Range Status   Specimen Description BLOOD RIGHT WRIST  Final   Special Requests BOTTLES DRAWN AEROBIC AND ANAEROBIC 10CC  Final   Culture  Setup Time   Final    GRAM POSITIVE COCCI IN CLUSTERS ANAEROBIC BOTTLE ONLY CRITICAL RESULT CALLED TO, READ BACK BY AND VERIFIED WITH: Kelby FamF WILSON [facility] 2155 09/17/16 A BROWNING    Culture (A)  Final    STAPHYLOCOCCUS SPECIES (COAGULASE NEGATIVE) THE SIGNIFICANCE OF ISOLATING THIS ORGANISM FROM A SINGLE SET OF BLOOD CULTURES WHEN MULTIPLE SETS ARE DRAWN IS UNCERTAIN. PLEASE NOTIFY THE MICROBIOLOGY DEPARTMENT WITHIN ONE WEEK IF SPECIATION AND SENSITIVITIES ARE REQUIRED.    Report Status 09/19/2016 FINAL  Final  Blood Culture ID Panel (Reflexed)     Status: None   Collection Time: 09/16/16  4:48 PM  Result Value Ref Range Status   Enterococcus species NOT DETECTED NOT DETECTED Final   Listeria monocytogenes NOT DETECTED NOT DETECTED Final   Staphylococcus species NOT DETECTED NOT DETECTED Final   Staphylococcus aureus NOT DETECTED NOT DETECTED Final   Streptococcus species NOT DETECTED NOT DETECTED Final   Streptococcus agalactiae NOT DETECTED NOT DETECTED Final   Streptococcus pneumoniae NOT DETECTED NOT DETECTED Final   Streptococcus pyogenes NOT DETECTED NOT DETECTED Final   Acinetobacter baumannii NOT DETECTED NOT DETECTED Final   Enterobacteriaceae species NOT DETECTED NOT DETECTED Final   Enterobacter cloacae complex NOT DETECTED NOT DETECTED Final   Escherichia coli NOT DETECTED NOT DETECTED Final   Klebsiella oxytoca NOT DETECTED NOT DETECTED Final   Klebsiella pneumoniae NOT DETECTED NOT DETECTED Final   Proteus species NOT DETECTED NOT DETECTED Final   Serratia marcescens NOT DETECTED NOT DETECTED Final   Haemophilus influenzae NOT DETECTED NOT DETECTED Final   Neisseria meningitidis NOT DETECTED NOT DETECTED Final   Pseudomonas aeruginosa NOT DETECTED NOT DETECTED Final   Candida albicans NOT DETECTED NOT DETECTED  Final   Candida glabrata NOT DETECTED NOT DETECTED Final   Candida krusei NOT DETECTED NOT DETECTED Final   Candida parapsilosis NOT DETECTED NOT DETECTED Final   Candida tropicalis NOT DETECTED NOT DETECTED Final  Blood Culture (routine x 2)     Status: None   Collection Time: 09/16/16  5:02 PM  Result Value Ref Range Status   Specimen Description BLOOD RIGHT HAND  Final   Special Requests BOTTLES DRAWN AEROBIC AND ANAEROBIC 5CC  Final   Culture NO GROWTH 5 DAYS  Final   Report Status 09/21/2016 FINAL  Final  Urine culture     Status: None   Collection Time: 09/16/16  8:09 PM  Result Value Ref Range Status   Specimen Description URINE, RANDOM  Final   Special Requests NONE  Final   Culture NO GROWTH  Final   Report Status 09/18/2016 FINAL  Final  Culture, blood (routine x 2)     Status: None (Preliminary result)   Collection Time: 09/18/16  7:49 AM  Result Value Ref Range Status   Specimen Description BLOOD RIGHT WRIST  Final   Special Requests BOTTLES DRAWN AEROBIC ONLY 5CC  Final   Culture NO GROWTH 3 DAYS  Final   Report Status PENDING  Incomplete  Culture, blood (routine x 2)     Status: None (Preliminary result)   Collection Time: 09/18/16  7:53 AM  Result Value Ref Range Status   Specimen Description BLOOD RIGHT HAND  Final   Special Requests BOTTLES DRAWN AEROBIC ONLY 5CC  Final   Culture NO GROWTH 3 DAYS  Final   Report Status PENDING  Incomplete       Radiology Studies: No results found.    Scheduled Meds: . ARIPiprazole  30 mg Oral Daily  . donepezil  10 mg Oral QHS  . feeding supplement (ENSURE ENLIVE)  237 mL Oral BID BM  . feeding supplement (PRO-STAT SUGAR FREE 64)  30 mL Oral BID  . fluticasone  2 spray Each Nare Daily  . gabapentin  100 mg Oral QHS  . levofloxacin  750 mg Oral QAC breakfast  . mouth rinse  15 mL Mouth Rinse BID  . memantine  10 mg Oral BID  . multivitamin with minerals  1 tablet Oral Daily  .  polyethylene glycol  17 g Oral BID  .  risperiDONE  1 mg Oral QHS  . risperiDONE  2 mg Oral Q breakfast  . rivaroxaban  20 mg Oral Q supper   Continuous Infusions: . sodium chloride 100 mL/hr at 09/22/16 1202     LOS: 6 days    Time spent: 30 minutes   Raizy Auzenne, MD [facility] [email or web address] Password [email or web address] 09/22/2016, 4:03 PM

## 2016-09-22 NOTE — Progress Notes (Signed)
Physical Therapy Treatment Patient Details Name: Royston Bakernest Moxon MRN: [id number] DOB: 03/09/1951 Today's Date: 09/22/2016    History of Present Illness Pt is a 65 y.o. male with dementia, lower extremity DVT and hematoma, sacral wound, chronic anemia and kidney disease, recent sepsis. Admitted from SNF with tachycardia, hypotension and leukocytosis    PT Comments    Patient demonstrated increased performance and participation in mobility today. He was also more engaging with PT . Patient limited during session by incontinent stool. [facility], but no one answered. Will continue to follow.    Follow Up Recommendations  SNF;Supervision/Assistance - 24 hour     Equipment Recommendations       Recommendations for Other Services       Precautions / Restrictions Precautions Precautions: Fall Precaution Comments: sacral wound and pressure sores to bil heels Restrictions Weight Bearing Restrictions: No    Mobility  Bed Mobility Overal bed mobility: Needs Assistance Bed Mobility: Rolling;Sidelying to Sit;Sit to Supine Rolling: Max assist;+2 for physical assistance Sidelying to sit: Min assist   Sit to supine: Mod assist   General bed mobility comments: pt required max +2 assist for rolling bil x 2 today for pericare due to incontinent BM. Once in sidelying left initially pt automatically dropped feet and initiated rise from surface with only min assist to fully elevate trunk. Assist to bring legs back onto surface with return to bed  Transfers Overall transfer level: Needs assistance   Transfers: Sit to/from Stand Sit to Stand: Max assist;+2 physical assistance;From elevated surface         General transfer comment: After 2 attempts with bil knees blocked pt able to rise from surface and clear buttock but remained in crouched position, unable to extend and returned to sitting  Ambulation/Gait             General Gait Details:  unable   Stairs            Wheelchair Mobility    Modified Rankin (Stroke Patients Only)       Balance     Sitting balance-Leahy Scale: Poor Sitting balance - Comments: EoB 5 min with cues for anterior translation Postural control: Posterior lean   Standing balance-Leahy Scale: Zero                      Cognition Arousal/Alertness: Awake/alert Behavior During Therapy: Flat affect Overall Cognitive Status: No family/caregiver present to determine baseline cognitive functioning                 General Comments: pt would make short statements but inconsistent    Exercises      General Comments        Pertinent Vitals/Pain Pain Assessment: No/denies pain    Home Living                      Prior Function            PT Goals (current goals can now be found in the care plan section) Progress towards PT goals: Progressing toward goals    Frequency           PT Plan Current plan remains appropriate    Co-evaluation             End of Session Equipment Utilized During Treatment: Gait belt Activity Tolerance: Patient tolerated treatment well Patient left: in bed;with call bell/phone within reach;with nursing/sitter in room;with bed alarm  set     Time: [id number] PT Time Calculation (min) (ACUTE ONLY): 26 min  Charges:  $Therapeutic Activity: 23-37 mins                    G Codes:      Deshawnda Acrey 09/22/2016, 10:32 AM   Krizia Flight SPT [phone]

## 2016-09-23 DIAGNOSIS — F79 Unspecified intellectual disabilities: Secondary | ICD-10-CM

## 2016-09-23 LAB — CULTURE, BLOOD (ROUTINE X 2)
CULTURE: NO GROWTH
CULTURE: NO GROWTH

## 2016-09-23 LAB — GLUCOSE, CAPILLARY
GLUCOSE-CAPILLARY: 92 mg/dL (ref 65–99)
Glucose-Capillary: 101 mg/dL — ABNORMAL HIGH (ref 65–99)
Glucose-Capillary: 84 mg/dL (ref 65–99)

## 2016-09-23 MED ORDER — LEVOFLOXACIN 750 MG PO TABS: 750.0000 mg | ORAL_TABLET | Freq: Every day | ORAL | 0 refills | Status: DC

## 2016-09-23 NOTE — Care Management Important Message (Signed)
Important Message  Patient Details  Name: [name] MRN: [id number] Date of Birth: 09/17/1951   Medicare Important Message Given:  Yes    Kyla BalzarineShealy, Finneus Kaneshiro Abena 09/23/2016, 9:51 AM

## 2016-09-23 NOTE — Progress Notes (Signed)
Clinical Social Worker facilitated patient discharge including contacting patient family and facility to confirm patient discharge plans.  Clinical information faxed to facility and family agreeable with plan.  CSW [facility]  RN Dois DavenportSandra to call [phone]) report prior to discharge.  Clinical Social Worker will sign off for now as social work intervention is no longer needed. Please consult us again if new need arises.  Marrianne MoodAshley Abriana Saltos, MSW, [employer]CSWA [phone]

## 2016-09-23 NOTE — Progress Notes (Signed)
Speech Language Pathology Treatment: Dysphagia  Patient Details Name: [name] [name] MRN: [id number] DOB: 08/06/1951 Today's Date: 09/23/2016 Time: [id number] SLP Time Calculation (min) (ACUTE ONLY): 15 min  Assessment / Plan / Recommendation Clinical Impression  Skilled treatment session focused on dysphagia goals. SLP facilitated session by providing Max A multimodal cues for postioning and arousal. Pt indicated that he didn't want food trials by pushing SLP's hand away and grunting. Pt willing to consume some thin orange juice. Pt didn't display any overt s/s of aspiration with min consumption (5 sips). Education provided to nursing on session and she doesn't endorse any difficulties with dysphagia 3 and thin liquids. Continue current plan of care during admission.    HPI HPI: [name] a 65 y.o.malewith dementia, lower extremity DVT and hematoma, sacral wound, chronic anemia and kidney disease who was recently admitted last month for sepsis from pneumonia was brought in the ER after patient's nursing facility found that patient has not been eating well for last 3 days and was febrile.  In the ER Patient was mildly febrile with leukocytosis. CT angiogram of the chest was negative for PE but does shows features concerning for pneumonia. RIGHT greater than LEFT tree-in-bud infiltrates. Heterogeneously enhancing small consolidation. Enhancing RIGHT middle lobe atelectasis. Prior MBS shows functional swallow though nectar thick liquids recommended given frequent shortness of breath with activity.       SLP Plan  Continue with current plan of care     Recommendations  Diet recommendations: Dysphagia 3 (mechanical soft);Thin liquid Liquids provided via: Cup;Straw Medication Administration: Whole meds with puree Supervision: Staff to assist with self feeding;Full supervision/cueing for compensatory strategies Compensations: Slow rate;Small sips/bites;Minimize environmental  distractions;Follow solids with liquid Postural Changes and/or Swallow Maneuvers: Seated upright 90 degrees;Upright 30-60 min after meal                Oral Care Recommendations: Oral care BID Follow up Recommendations: Skilled Nursing facility;24 hour supervision/assistance Plan: Continue with current plan of care       GO                [name] 09/23/2016, 9:12 AM

## 2016-09-23 NOTE — Discharge Summary (Signed)
Physician Discharge Summary  [name] AVW:[id number] DOB: 1951-03-19 DOA: 09/16/2016  PCP: Colon Branch, MD  Admit date: 09/16/2016 Discharge date: 09/23/2016  Admitted From: SNF  Disposition:  SNF   Recommendations for Outpatient Follow-up:  Return to SNF- consider palliative care consult if he continues to have poor progression Consider modified barium swallow to assess for aspiration  if he continues to have pneumonia's  Discharge Condition:  stable   CODE STATUS:  DNR   Diet recommendation:  Regular diet Consultations:  none    Discharge Diagnoses:  Principal Problem:   HCAP (healthcare-associated pneumonia) Active Problems:   Hypertension   Dementia   DVT (deep venous thrombosis) (HCC)   CKD (chronic kidney disease) stage 3, GFR 30-59 ml/min   Mental retardation   Hematoma    Subjective: No complaints.   Brief Summary: [name] a 65 y.o.malewith dementia, lower extremity DVT and hematoma, sacral wound, chronic anemia and kidney disease who was recently admitted last month for sepsis from pneumonia was brought in the ER after patient's nursing facility found that patient has not been eating well for last 3 days and was febrile. No fever documented in Er.  CTA was negative for PE, but showed "Bilateral tree-in-bud infiltrates may be infectious or inflammatory andRIGHT lung base enhancing atelectasis, possible superimposed pneumonia."  Patient was admitted for further treatment and care.   Hospital Course:  Sepsis secondary to HCAP  - acute respiratory failure - no PE on CT- "Bilateral tree-in-bud infiltrates may be infectious or inflammatory and RIGHT lung base enhancing atelectasis, possible superimposed pneumonia." - discharged on 11/28 after treatment for pneumococcal pneumonia and + rhinovirus nasal swab - has not improved with Vanc/Cefepime and then Augmentin - still with low grade fever of 99, hypoxia, elevated HR - no signs of aspiration on SLP  evals- switched to Levaquin on 12/13 -Influenza negative  -Blood cultures with 1 of 2 GPC, likely a contaminant. Repeat blood cultures negative to date.  - pulse ox now 94% on room air although CXR looks worse- suspect his xray is lagging behind - SLP eval does not reveal that he is aspirating at this time  Anemia - AOCD per anemia panel on 12/09/15 - at baseline in 7-8 range  History of DVT -Continue Xarelto  Chronic normocytic anemia  -Baseline Hgb 8 -Stable  CKD stage 3 -Baseline Cr 1.5-1.7 -Stable  Dementia and mental retardation -Contonue Aricept, Namenda, Risperal - non-verbal  Sacral decub ulcer stage 2, POA -Wound team rec to continue silicone foam dressing, change every 3 days and PRN soilage. Add low air loss mattress for pressure redistribution.   Discharge Instructions  Discharge Instructions    Call MD for:  difficulty breathing, headache or visual disturbances    Complete by:  As directed    Call MD for:  temperature >100.4    Complete by:  As directed    Diet general    Complete by:  As directed    Increase activity slowly    Complete by:  As directed    Increase activity slowly    Complete by:  As directed        Medication List    TAKE these medications   acetaminophen 650 MG suppository Commonly known as:  TYLENOL Place 1 suppository (650 mg total) rectally every 6 (six) hours as needed for mild pain (or Fever >/= 101).   ARIPiprazole 30 MG tablet Commonly known as:  ABILIFY Take 30 mg by mouth daily.   donepezil  10 MG tablet Commonly known as:  ARICEPT Take 10 mg by mouth at bedtime.   furosemide 20 MG tablet Commonly known as:  LASIX Take 1 tablet (20 mg total) by mouth 2 (two) times daily.   gabapentin 100 MG capsule Commonly known as:  NEURONTIN Take 100 mg by mouth at bedtime.   levofloxacin 750 MG tablet Commonly known as:  LEVAQUIN Take 1 tablet (750 mg total) by mouth daily before breakfast. Start taking on:   09/24/2016   LORazepam 0.5 MG tablet Commonly known as:  ATIVAN Take 1 tablet (0.5 mg total) by mouth every 8 (eight) hours as needed (for agitation).   memantine 10 MG tablet Commonly known as:  NAMENDA Take 10 mg by mouth 2 (two) times daily.   polyethylene glycol packet Commonly known as:  MIRALAX / GLYCOLAX Take 17 g by mouth 2 (two) times daily.   potassium chloride 10 MEQ tablet Commonly known as:  K-DUR,KLOR-CON Take 10 mEq by mouth daily.   risperiDONE 1 MG tablet Commonly known as:  RISPERDAL Take 1 tablet (1 mg total) by mouth at bedtime. Reported on 10/25/2015   risperiDONE 2 MG tablet Commonly known as:  RISPERDAL Take 1 tablet (2 mg total) by mouth daily with breakfast.   rivaroxaban 20 MG Tabs tablet Commonly known as:  XARELTO Take 1 tablet (20 mg total) by mouth daily with supper.   senna-docusate 8.6-50 MG tablet Commonly known as:  Senokot-S Take 2 tablets by mouth at bedtime as needed for mild constipation or moderate constipation.   Vitamin D3 1000 units Caps Take 1 capsule by mouth 2 (two) times daily.      Follow-up Information    Colon BranchQURESHI, AYYAZ, MD. Schedule an appointment as soon as possible for a visit in 1 week.   Specialty:  Internal Medicine Why:  PLESE CALL TO SCHEDULE Contact information: [address] [place] [id number] [phone]          No Known Allergies   Procedures/Studies:   Ct Angio Chest Pe W And/or Wo Contrast  Result Date: 09/16/2016 CLINICAL DATA:  Failure to thrive for 3 days. Tachycardia and tachypnea. History of deep vein thrombosis. EXAM: CT ANGIOGRAPHY CHEST WITH CONTRAST TECHNIQUE: Multidetector CT imaging of the chest was performed using the standard protocol during bolus administration of intravenous contrast. Multiplanar CT image reconstructions and MIPs were obtained to evaluate the vascular anatomy. CONTRAST:  80 cc Isovue 370 COMPARISON:  Chest radiograph September 16, 2016 at 1651 hours  FINDINGS: CARDIOVASCULAR: Adequate contrast opacification of the pulmonary artery's. Main pulmonary artery is not enlarged. No pulmonary arterial filling defects to the level of the subsegmental branches. Heart size is normal, no right heart strain. Mild coronary artery calcifications. No pericardial effusions. Thoracic aorta is normal course and caliber, LEFT vertebral artery arises directly from the aortic arch. MEDIASTINUM/NODES: No lymphadenopathy by CT size criteria. LUNGS/PLEURA: Tracheobronchial tree is patent, no pneumothorax. RIGHT greater than LEFT tree-in-bud infiltrates. Heterogeneously enhancing small consolidation. Enhancing RIGHT middle lobe atelectasis. Bilateral scattered linear atelectasis or scarring. Ex UPPER ABDOMEN: Included view of the abdomen is nonacute. Inferior vena cava filter at the junction of the RIGHT renal vein. Partially imaged RIGHT renal cyst measure least 5.7 cm. Moderate amount of retained large bowel stool. Subcentimeter presumed cyst in dome of the liver. MUSCULOSKELETAL: Elevated RIGHT hemidiaphragm. Visualized soft tissues and included osseous structure are s nonsuspicious. Review of the MIP images confirms the above findings. IMPRESSION: No acute pulmonary embolism. Bilateral tree-in-bud  infiltrates may be infectious or inflammatory. RIGHT lung base enhancing atelectasis, possible superimposed pneumonia. Electronically Signed   By: Awilda Metro M.D.   On: 09/16/2016 23:52   Dg Chest Port 1 View  Result Date: 09/22/2016 CLINICAL DATA:  Hypoxia EXAM: PORTABLE CHEST 1 VIEW COMPARISON:  09/16/2016 FINDINGS: Examination limited by patient's chin over the right lung apex. Elevated right diaphragm. Increasing atelectasis or infiltrate at the lung bases. Possible tiny right effusion. Stable cardiomediastinal silhouette. No pneumothorax. IMPRESSION: Right lung apex is obscured by the patient's chin. Increasing bibasilar atelectasis or infiltrate. Possible tiny right  effusion. Stable cardiomegaly. Electronically Signed   By: Jasmine Pang M.D.   On: 09/22/2016 16:36   Dg Chest Port 1 View  Result Date: 09/16/2016 CLINICAL DATA:  Fever for 3 days EXAM: PORTABLE CHEST 1 VIEW COMPARISON:  None. FINDINGS: Bilateral airspace disease has improved. There continues to be elevation of the right hemidiaphragm with bibasilar atelectasis. Normal heart size. No pneumothorax. IMPRESSION: Improved bilateral airspace disease.  Bibasilar atelectasis. Electronically Signed   By: Jolaine Click M.D.   On: 09/16/2016 17:05   Dg Chest Port 1 View  Result Date: 08/27/2016 CLINICAL DATA:  Shortness of breath EXAM: PORTABLE CHEST 1 VIEW COMPARISON:  08/26/2016 and multiple previous FINDINGS: Bilateral pulmonary infiltrates persist, similar to yesterday's study but improved compared to the study of 11/13. No worsening or new finding. No visible effusion. IMPRESSION: Persistent bilateral pulmonary infiltrates, unchanged since yesterday. Electronically Signed   By: Paulina Fusi M.D.   On: 08/27/2016 09:50   Dg Chest Port 1 View  Result Date: 08/26/2016 CLINICAL DATA:  Pneumonia EXAM: PORTABLE CHEST 1 VIEW COMPARISON:  08/23/2016 FINDINGS: Diffuse bilateral airspace disease has improved in the interval. Bibasilar atelectasis also improved. Right pleural effusion improved. Findings most consistent with clearing heart failure or edema. Gas shadow overlying the right heart was not present on prior studies and is of uncertain etiology. This could be in the esophagus or hiatal hernia however is not seen on prior studies. If the patient has had acute changes symptoms, consider chest CT to determine the localization of this gas. IMPRESSION: Interval improvement in bilateral airspace disease. Improvement in bibasilar atelectasis and right effusion Gas shadow overlying the right heart border of uncertain etiology. Follow-up recommended. CT if the patient has had acute interval change. Electronically  Signed   By: Marlan Palau M.D.   On: 08/26/2016 09:17   Dg Swallowing Func-speech Pathology  Result Date: 08/29/2016 Objective Swallowing Evaluation: Type of Study: MBS-Modified Barium Swallow Study Patient Details Name: Morgen Ritacco MRN: [id number] Date of Birth: September 11, 1951 Today's Date: 08/29/2016 Time: SLP Start Time (ACUTE ONLY): 1015-SLP Stop Time (ACUTE ONLY): 1047 SLP Time Calculation (min) (ACUTE ONLY): 32 min Past Medical History: Past Medical History: Diagnosis Date . Dementia 06/19/2014 . DVT (deep venous thrombosis) (HCC)   right leg . Dyslipidemia  . Hypertension  . Mental retardation  . Obesity  . Other and unspecified hyperlipidemia 06/19/2014 Past Surgical History: No past surgical history on file. HPI: 65 y.o.BM PMHxSevere Mental Handicap(nonverbal at baseline), HTN, DVT on Xarelto, CKD, Mild Chronic Diastolic CHF. Who presented with cough and severe weakness. EMS found him lethargic, congested, and hypoxic at 88% on room air. In the ED he was found to be hypothermic and CXR showed atelectasis vs opacity in the right base. He has been admitted twice in the past year under similar circumstances. The first time in January he had mild AKI, and improved with fluids. The second  time in March he was hypothermic but without any focal signs of infection and his TSH, cortisol, and lactate were normal as was a flu swab. He was given fluids and he returned completely to normal with just supportive care. Clinically remains in a precarious situation with persisting hypotension and intermittent severe hypoxemia, continue conservative medical care as per Dr. Sharon Seller discussion with his power of attorney, not a candidate for BiPAP given his heavy secretions and his inability to control them on his own. Physiotherapy vest QID.  Subjective: alert, min verbal interaction Assessment / Plan / Recommendation CHL IP CLINICAL IMPRESSIONS 08/29/2016 Therapy Diagnosis Mild pharyngeal phase dysphagia Clinical  Impression Patient presents with a functional oropharyngeal swallow. Swallow initiation mildly delayed as a result of decreased coordination of breath and swallow, which with thin liquids leads to intermittent flash penetration. Oral manipulation of bolus and pharyngeal strength WFL. Although no frank penetration or aspiration noted, recommend initiation of conservative diet to maximize energy conservation and decrease aspiration risk in the setting of respiratory dysfunction as patient with frequent increase in WOB with any activity. Will f/u for tolerance at bedside as well as potential to advance diet with improved respiratory stability. Pateint should not require a f/u MBS to advance diet.   Impact on safety and function Moderate aspiration risk   CHL IP TREATMENT RECOMMENDATION 08/29/2016 Treatment Recommendations Therapy as outlined in treatment plan below   Prognosis 08/29/2016 Prognosis for Safe Diet Advancement Good Barriers to Reach Goals Cognitive deficits Barriers/Prognosis Comment -- CHL IP DIET RECOMMENDATION 08/29/2016 SLP Diet Recommendations Dysphagia 2 (Fine chop) solids;Nectar thick liquid Liquid Administration via Cup;Straw Medication Administration Whole meds with puree Compensations Slow rate;Small sips/bites Postural Changes Seated upright at 90 degrees   CHL IP OTHER RECOMMENDATIONS 08/29/2016 Recommended Consults -- Oral Care Recommendations Oral care BID Other Recommendations Order thickener from pharmacy;Prohibited food (jello, ice cream, thin soups);Remove water pitcher   CHL IP FOLLOW UP RECOMMENDATIONS 08/29/2016 Follow up Recommendations None   CHL IP FREQUENCY AND DURATION 08/29/2016 Speech Therapy Frequency (ACUTE ONLY) min 2x/week Treatment Duration 2 weeks      CHL IP ORAL PHASE 08/29/2016 Oral Phase WFL Oral - Pudding Teaspoon -- Oral - Pudding Cup -- Oral - Honey Teaspoon -- Oral - Honey Cup -- Oral - Nectar Teaspoon -- Oral - Nectar Cup -- Oral - Nectar Straw -- Oral - Thin  Teaspoon -- Oral - Thin Cup -- Oral - Thin Straw -- Oral - Puree -- Oral - Mech Soft -- Oral - Regular -- Oral - Multi-Consistency -- Oral - Pill -- Oral Phase - Comment --  CHL IP PHARYNGEAL PHASE 08/29/2016 Pharyngeal Phase Impaired Pharyngeal- Pudding Teaspoon -- Pharyngeal -- Pharyngeal- Pudding Cup -- Pharyngeal -- Pharyngeal- Honey Teaspoon -- Pharyngeal -- Pharyngeal- Honey Cup -- Pharyngeal -- Pharyngeal- Nectar Teaspoon Delayed swallow initiation-vallecula Pharyngeal -- Pharyngeal- Nectar Cup -- Pharyngeal -- Pharyngeal- Nectar Straw Delayed swallow initiation-vallecula Pharyngeal -- Pharyngeal- Thin Teaspoon Delayed swallow initiation-vallecula Pharyngeal -- Pharyngeal- Thin Cup Delayed swallow initiation-pyriform sinuses;Penetration/Aspiration before swallow Pharyngeal Material enters airway, remains ABOVE vocal cords then ejected out Pharyngeal- Thin Straw Delayed swallow initiation-pyriform sinuses;Penetration/Aspiration before swallow Pharyngeal Material enters airway, remains ABOVE vocal cords then ejected out Pharyngeal- Puree Delayed swallow initiation-vallecula Pharyngeal -- Pharyngeal- Mechanical Soft Delayed swallow initiation-vallecula Pharyngeal -- Pharyngeal- Regular -- Pharyngeal -- Pharyngeal- Multi-consistency -- Pharyngeal -- Pharyngeal- Pill -- Pharyngeal -- Pharyngeal Comment --  CHL IP CERVICAL ESOPHAGEAL PHASE 08/29/2016 Cervical Esophageal Phase WFL Pudding Teaspoon -- Pudding Cup -- Honey  Teaspoon -- Honey Cup -- Nectar Teaspoon -- Nectar Cup -- Nectar Straw -- Thin Teaspoon -- Thin Cup -- Thin Straw -- Puree -- Mechanical Soft -- Regular -- Multi-consistency -- Pill -- Cervical Esophageal Comment -- Ferdinand Lango MA, CCC-SLP [phone] McCoy Leah Meryl 08/29/2016, 11:01 AM                  Discharge Exam: Vitals:   09/22/16 2028 09/23/16 0500  BP: 109/64 127/80  Pulse: (!) 109 (!) 101  Resp: (!) 24 (!) 22  Temp: 99.8 F (37.7 C) 98.6 F (37 C)   Vitals:   09/22/16  1208 09/22/16 2028 09/22/16 2213 09/23/16 0500  BP: 100/60 109/64  127/80  Pulse: 91 (!) 109  (!) 101  Resp: 19 (!) 24  (!) 22  Temp: 99.1 F (37.3 C) 99.8 F (37.7 C)  98.6 F (37 C)  TempSrc: Oral Axillary  Axillary  SpO2: 92% 96% 93% 97%  Weight:    97.7 kg (215 lb 4.8 oz)  Height:        General: Pt is alert, awake, not in acute distress Cardiovascular: RRR, S1/S2 +, no rubs, no gallops Respiratory: CTA bilaterally, no wheezing, no rhonchi Abdominal: Soft, NT, ND, bowel sounds + Extremities: no edema, no cyanosis    The results of significant diagnostics from this hospitalization (including imaging, microbiology, ancillary and laboratory) are listed below for reference.     Microbiology: Recent Results (from the past 240 hour(s))  Blood Culture (routine x 2)     Status: Abnormal   Collection Time: 09/16/16  4:48 PM  Result Value Ref Range Status   Specimen Description BLOOD RIGHT WRIST  Final   Special Requests BOTTLES DRAWN AEROBIC AND ANAEROBIC 10CC  Final   Culture  Setup Time   Final    GRAM POSITIVE COCCI IN CLUSTERS ANAEROBIC BOTTLE ONLY CRITICAL RESULT CALLED TO, READ BACK BY AND VERIFIED WITH: Kelby Fam [facility] 2155 09/17/16 A BROWNING    Culture (A)  Final    STAPHYLOCOCCUS SPECIES (COAGULASE NEGATIVE) THE SIGNIFICANCE OF ISOLATING THIS ORGANISM FROM A SINGLE SET OF BLOOD CULTURES WHEN MULTIPLE SETS ARE DRAWN IS UNCERTAIN. PLEASE NOTIFY THE MICROBIOLOGY DEPARTMENT WITHIN ONE WEEK IF SPECIATION AND SENSITIVITIES ARE REQUIRED.    Report Status 09/19/2016 FINAL  Final  Blood Culture ID Panel (Reflexed)     Status: None   Collection Time: 09/16/16  4:48 PM  Result Value Ref Range Status   Enterococcus species NOT DETECTED NOT DETECTED Final   Listeria monocytogenes NOT DETECTED NOT DETECTED Final   Staphylococcus species NOT DETECTED NOT DETECTED Final   Staphylococcus aureus NOT DETECTED NOT DETECTED Final   Streptococcus species NOT DETECTED NOT DETECTED Final    Streptococcus agalactiae NOT DETECTED NOT DETECTED Final   Streptococcus pneumoniae NOT DETECTED NOT DETECTED Final   Streptococcus pyogenes NOT DETECTED NOT DETECTED Final   Acinetobacter baumannii NOT DETECTED NOT DETECTED Final   Enterobacteriaceae species NOT DETECTED NOT DETECTED Final   Enterobacter cloacae complex NOT DETECTED NOT DETECTED Final   Escherichia coli NOT DETECTED NOT DETECTED Final   Klebsiella oxytoca NOT DETECTED NOT DETECTED Final   Klebsiella pneumoniae NOT DETECTED NOT DETECTED Final   Proteus species NOT DETECTED NOT DETECTED Final   Serratia marcescens NOT DETECTED NOT DETECTED Final   Haemophilus influenzae NOT DETECTED NOT DETECTED Final   Neisseria meningitidis NOT DETECTED NOT DETECTED Final   Pseudomonas aeruginosa NOT DETECTED NOT DETECTED Final   Candida albicans NOT DETECTED  NOT DETECTED Final   Candida glabrata NOT DETECTED NOT DETECTED Final   Candida krusei NOT DETECTED NOT DETECTED Final   Candida parapsilosis NOT DETECTED NOT DETECTED Final   Candida tropicalis NOT DETECTED NOT DETECTED Final  Blood Culture (routine x 2)     Status: None   Collection Time: 09/16/16  5:02 PM  Result Value Ref Range Status   Specimen Description BLOOD RIGHT HAND  Final   Special Requests BOTTLES DRAWN AEROBIC AND ANAEROBIC 5CC  Final   Culture NO GROWTH 5 DAYS  Final   Report Status 09/21/2016 FINAL  Final  Urine culture     Status: None   Collection Time: 09/16/16  8:09 PM  Result Value Ref Range Status   Specimen Description URINE, RANDOM  Final   Special Requests NONE  Final   Culture NO GROWTH  Final   Report Status 09/18/2016 FINAL  Final  Culture, blood (routine x 2)     Status: None (Preliminary result)   Collection Time: 09/18/16  7:49 AM  Result Value Ref Range Status   Specimen Description BLOOD RIGHT WRIST  Final   Special Requests BOTTLES DRAWN AEROBIC ONLY 5CC  Final   Culture NO GROWTH 4 DAYS  Final   Report Status PENDING  Incomplete   Culture, blood (routine x 2)     Status: None (Preliminary result)   Collection Time: 09/18/16  7:53 AM  Result Value Ref Range Status   Specimen Description BLOOD RIGHT HAND  Final   Special Requests BOTTLES DRAWN AEROBIC ONLY 5CC  Final   Culture NO GROWTH 4 DAYS  Final   Report Status PENDING  Incomplete     Labs: BNP (last 3 results)  Recent Labs  10/25/15 2033  BNP 8.3   Basic Metabolic Panel:  Recent Labs Lab 09/18/16 0328 09/19/16 0253 09/20/16 0811 09/21/16 0753 09/22/16 0244  NA 141 140 139 139 139  K 4.1 4.1 4.2 4.6 4.5  CL 105 104 101 100* 102  CO2 28 29 29 30 30   GLUCOSE 87 95 90 97 95  BUN 20 19 20 18 17   CREATININE 1.37* 1.41* 1.50* 1.42* 1.38*  CALCIUM 9.0 9.1 9.2 9.5 9.1   Liver Function Tests:  Recent Labs Lab 09/16/16 1702 09/17/16 0321  AST 28 21  ALT 24 21  ALKPHOS 96 86  BILITOT 0.5 0.5  PROT 7.1 6.7  ALBUMIN 2.3* 2.1*   No results for input(s): LIPASE, AMYLASE in the last 168 hours. No results for input(s): AMMONIA in the last 168 hours. CBC:  Recent Labs Lab 09/18/16 0328 09/19/16 0253 09/20/16 0811 09/21/16 0753 09/22/16 0244  WBC 9.2 8.6 9.3 10.4 9.7  NEUTROABS 6.2 5.8 6.8 7.6 7.0  HGB 7.3* 7.9* 7.7* 8.0* 7.6*  HCT 23.1* 25.2* 24.1* 25.7* 24.5*  MCV 86.8 87.2 88.0 87.4 86.6  PLT 464* 479* 462* 521* 496*   Cardiac Enzymes: No results for input(s): CKTOTAL, CKMB, CKMBINDEX, TROPONINI in the last 168 hours. BNP: Invalid input(s): POCBNP CBG:  Recent Labs Lab 09/21/16 2306 09/22/16 0624 09/22/16 1626 09/23/16 0047 09/23/16 0616  GLUCAP 107* 92 117* 101* 92   D-Dimer No results for input(s): DDIMER in the last 72 hours. Hgb A1c No results for input(s): HGBA1C in the last 72 hours. Lipid Profile No results for input(s): CHOL, HDL, LDLCALC, TRIG, CHOLHDL, LDLDIRECT in the last 72 hours. Thyroid function studies No results for input(s): TSH, T4TOTAL, T3FREE, THYROIDAB in the last 72 hours.  Invalid input(s):  FREET3 Anemia work up No results for input(s): VITAMINB12, FOLATE, FERRITIN, TIBC, IRON, RETICCTPCT in the last 72 hours. Urinalysis    Component Value Date/Time   COLORURINE YELLOW 09/16/2016 2009   APPEARANCEUR CLEAR 09/16/2016 2009   LABSPEC 1.013 09/16/2016 2009   PHURINE 5.0 09/16/2016 2009   GLUCOSEU NEGATIVE 09/16/2016 2009   HGBUR NEGATIVE 09/16/2016 2009   BILIRUBINUR NEGATIVE 09/16/2016 2009   KETONESUR NEGATIVE 09/16/2016 2009   PROTEINUR NEGATIVE 09/16/2016 2009   UROBILINOGEN 0.2 06/18/2014 1147   NITRITE NEGATIVE 09/16/2016 2009   LEUKOCYTESUR NEGATIVE 09/16/2016 2009   Sepsis Labs Invalid input(s): PROCALCITONIN,  WBC,  LACTICIDVEN Microbiology Recent Results (from the past 240 hour(s))  Blood Culture (routine x 2)     Status: Abnormal   Collection Time: 09/16/16  4:48 PM  Result Value Ref Range Status   Specimen Description BLOOD RIGHT WRIST  Final   Special Requests BOTTLES DRAWN AEROBIC AND ANAEROBIC 10CC  Final   Culture  Setup Time   Final    GRAM POSITIVE COCCI IN CLUSTERS ANAEROBIC BOTTLE ONLY CRITICAL RESULT CALLED TO, READ BACK BY AND VERIFIED WITH: Kelby Fam PHARMD 2155 09/17/16 A BROWNING    Culture (A)  Final    STAPHYLOCOCCUS SPECIES (COAGULASE NEGATIVE) THE SIGNIFICANCE OF ISOLATING THIS ORGANISM FROM A SINGLE SET OF BLOOD CULTURES WHEN MULTIPLE SETS ARE DRAWN IS UNCERTAIN. PLEASE NOTIFY THE MICROBIOLOGY DEPARTMENT WITHIN ONE WEEK IF SPECIATION AND SENSITIVITIES ARE REQUIRED.    Report Status 09/19/2016 FINAL  Final  Blood Culture ID Panel (Reflexed)     Status: None   Collection Time: 09/16/16  4:48 PM  Result Value Ref Range Status   Enterococcus species NOT DETECTED NOT DETECTED Final   Listeria monocytogenes NOT DETECTED NOT DETECTED Final   Staphylococcus species NOT DETECTED NOT DETECTED Final   Staphylococcus aureus NOT DETECTED NOT DETECTED Final   Streptococcus species NOT DETECTED NOT DETECTED Final   Streptococcus agalactiae NOT  DETECTED NOT DETECTED Final   Streptococcus pneumoniae NOT DETECTED NOT DETECTED Final   Streptococcus pyogenes NOT DETECTED NOT DETECTED Final   Acinetobacter baumannii NOT DETECTED NOT DETECTED Final   Enterobacteriaceae species NOT DETECTED NOT DETECTED Final   Enterobacter cloacae complex NOT DETECTED NOT DETECTED Final   Escherichia coli NOT DETECTED NOT DETECTED Final   Klebsiella oxytoca NOT DETECTED NOT DETECTED Final   Klebsiella pneumoniae NOT DETECTED NOT DETECTED Final   Proteus species NOT DETECTED NOT DETECTED Final   Serratia marcescens NOT DETECTED NOT DETECTED Final   Haemophilus influenzae NOT DETECTED NOT DETECTED Final   Neisseria meningitidis NOT DETECTED NOT DETECTED Final   Pseudomonas aeruginosa NOT DETECTED NOT DETECTED Final   Candida albicans NOT DETECTED NOT DETECTED Final   Candida glabrata NOT DETECTED NOT DETECTED Final   Candida krusei NOT DETECTED NOT DETECTED Final   Candida parapsilosis NOT DETECTED NOT DETECTED Final   Candida tropicalis NOT DETECTED NOT DETECTED Final  Blood Culture (routine x 2)     Status: None   Collection Time: 09/16/16  5:02 PM  Result Value Ref Range Status   Specimen Description BLOOD RIGHT HAND  Final   Special Requests BOTTLES DRAWN AEROBIC AND ANAEROBIC 5CC  Final   Culture NO GROWTH 5 DAYS  Final   Report Status 09/21/2016 FINAL  Final  Urine culture     Status: None   Collection Time: 09/16/16  8:09 PM  Result Value Ref Range Status   Specimen Description URINE, RANDOM  Final   Special  Requests NONE  Final   Culture NO GROWTH  Final   Report Status 09/18/2016 FINAL  Final  Culture, blood (routine x 2)     Status: None (Preliminary result)   Collection Time: 09/18/16  7:49 AM  Result Value Ref Range Status   Specimen Description BLOOD RIGHT WRIST  Final   Special Requests BOTTLES DRAWN AEROBIC ONLY 5CC  Final   Culture NO GROWTH 4 DAYS  Final   Report Status PENDING  Incomplete  Culture, blood (routine x 2)      Status: None (Preliminary result)   Collection Time: 09/18/16  7:53 AM  Result Value Ref Range Status   Specimen Description BLOOD RIGHT HAND  Final   Special Requests BOTTLES DRAWN AEROBIC ONLY 5CC  Final   Culture NO GROWTH 4 DAYS  Final   Report Status PENDING  Incomplete     Time coordinating discharge: Over 30 minutes  SIGNED:   Calvert CantorIZWAN,Davon Folta, MD  [facility] 09/23/2016, 11:26 AM Pager   If 7PM-7AM, please contact night-coverage [email or web address] Password [email or web address]

## 2016-09-27 ENCOUNTER — Other Ambulatory Visit: Payer: Self-pay | Admitting: *Deleted

## 2016-09-27 ENCOUNTER — Encounter: Payer: Self-pay | Admitting: Adult Health

## 2016-09-27 ENCOUNTER — Non-Acute Institutional Stay (SKILLED_NURSING_FACILITY): Payer: Medicare Other | Admitting: Adult Health

## 2016-09-27 DIAGNOSIS — A419 Sepsis, unspecified organism: Secondary | ICD-10-CM

## 2016-09-27 DIAGNOSIS — R1314 Dysphagia, pharyngoesophageal phase: Secondary | ICD-10-CM

## 2016-09-27 DIAGNOSIS — I5032 Chronic diastolic (congestive) heart failure: Secondary | ICD-10-CM

## 2016-09-27 DIAGNOSIS — F333 Major depressive disorder, recurrent, severe with psychotic symptoms: Secondary | ICD-10-CM | POA: Diagnosis not present

## 2016-09-27 DIAGNOSIS — N183 Chronic kidney disease, stage 3 (moderate): Secondary | ICD-10-CM

## 2016-09-27 DIAGNOSIS — N170 Acute kidney failure with tubular necrosis: Secondary | ICD-10-CM | POA: Diagnosis not present

## 2016-09-27 DIAGNOSIS — I82501 Chronic embolism and thrombosis of unspecified deep veins of right lower extremity: Secondary | ICD-10-CM | POA: Diagnosis not present

## 2016-09-27 DIAGNOSIS — J189 Pneumonia, unspecified organism: Secondary | ICD-10-CM | POA: Diagnosis not present

## 2016-09-27 MED ORDER — LORAZEPAM 0.5 MG PO TABS: 0.5000 mg | ORAL_TABLET | Freq: Three times a day (TID) | ORAL | 0 refills | Status: DC | PRN

## 2016-09-27 MED ORDER — LORAZEPAM 0.5 MG PO TABS: 0.5000 mg | ORAL_TABLET | Freq: Three times a day (TID) | ORAL | 0 refills | Status: AC | PRN

## 2016-09-27 NOTE — Progress Notes (Addendum)
Patient ID: [name], male   DOB: 10/26/1950, 65 y.o.   MRN: [id number]   Location:    [facility] Room Number: 158A Place of Service:  SNF (31)   CODE STATUS: DNR  No Known Allergies  Chief Complaint  Patient presents with  . Hospitalization Follow-up    Hospital Follow up    HPI:  He is a long term resident of this facility who has been hospitalized for sepsis related to HCAP and acute respiratory failure.  He was discharged with a low grade temp; his chest x-ray was lagging behind his treatment. He is unable to participate in the hpi or ros. Today he is running a fever and does require 02 at 2 liters.   Past Medical History:  Diagnosis Date  . Dementia 06/19/2014  . DVT (deep venous thrombosis) (HCC)    right leg  . Dyslipidemia   . Hypertension   . Mental retardation   . Obesity   . Other and unspecified hyperlipidemia 06/19/2014    History reviewed. No pertinent surgical history.  Social History   Social History  . Marital status: Single    Spouse name: N/A  . Number of children: N/A  . Years of education: N/A   Occupational History  . Not on file.   Social History Main Topics  . Smoking status: Never Smoker  . Smokeless tobacco: Never Used  . Alcohol use No  . Drug use: No  . Sexual activity: Not on file   Other Topics Concern  . Not on file   Social History Narrative  . No narrative on file   Family History  Problem Relation Age of Onset  . Hypertension        VITAL SIGNS BP 127/68   Pulse 90   Temp 97.8 F (36.6 C) (Oral)   Resp 18   Ht 5\' 11"  (1.803 m)   Wt 207 lb (93.9 kg)   SpO2 92%   BMI 28.87 kg/m   Patient's Medications  New Prescriptions   No medications on file  Previous Medications   ACETAMINOPHEN (TYLENOL) 650 MG SUPPOSITORY    Place 1 suppository (650 mg total) rectally every 6 (six) hours as needed for mild pain (or Fever >/= 101).   ARIPIPRAZOLE (ABILIFY) 30 MG TABLET    Take 30 mg by mouth daily.   CHOLECALCIFEROL (VITAMIN D3) 1000 UNITS CAPS    Take 1 capsule by mouth 2 (two) times daily.     DONEPEZIL (ARICEPT) 10 MG TABLET    Take 10 mg by mouth at bedtime.    FUROSEMIDE (LASIX) 20 MG TABLET    Take 1 tablet (20 mg total) by mouth 2 (two) times daily.   GABAPENTIN (NEURONTIN) 100 MG CAPSULE    Take 100 mg by mouth at bedtime.     LEVOFLOXACIN (LEVAQUIN) 750 MG TABLET    Take 1 tablet (750 mg total) by mouth daily before breakfast.   LORAZEPAM (ATIVAN) 0.5 MG TABLET    Take 1 tablet (0.5 mg total) by mouth every 8 (eight) hours as needed (for agitation).   MEMANTINE (NAMENDA) 10 MG TABLET    Take 10 mg by mouth 2 (two) times daily.     POLYETHYLENE GLYCOL (MIRALAX / GLYCOLAX) PACKET    Take 17 g by mouth 2 (two) times daily.   POTASSIUM CHLORIDE (K-DUR,KLOR-CON) 10 MEQ TABLET    Take 10 mEq by mouth daily.   RISPERIDONE (RISPERDAL) 1 MG TABLET  Take 1 tablet (1 mg total) by mouth at bedtime. Reported on 10/25/2015   RISPERIDONE (RISPERDAL) 2 MG TABLET    Take 1 tablet (2 mg total) by mouth daily with breakfast.   RIVAROXABAN (XARELTO) 20 MG TABS TABLET    Take 1 tablet (20 mg total) by mouth daily with supper.   SENNA-DOCUSATE (SENOKOT-S) 8.6-50 MG TABLET    Take 2 tablets by mouth at bedtime as needed for mild constipation or moderate constipation.  Modified Medications   No medications on file  Discontinued Medications   No medications on file     SIGNIFICANT DIAGNOSTIC EXAMS   08-22-16: chest x-ray: Shallow inspiration with persistent atelectasis in the lung bases similar previous study. No developing consolidation.  08-26-16: chest x-ray: Interval improvement in bilateral airspace disease. Improvement in bibasilar atelectasis and right effusion Gas shadow overlying the right heart border of uncertain etiology. Follow-up recommended. CT if the patient has had acute interval change.  08-29-16: swallow study: Dysphagia 2 (Fine chop) solids;Nectar thick liquid   LABS REVIEWED:    08-22-16: wbc 5.8; hgb 11.1; hct 34.0; mcv 84.6; plt 114; glucose 123; bun 44; creat 1.68; k+ 4.7; na++ 144; liver normal albumin 3.3; tsh 5.377; blood culture: no growth; urine culture: no growth.  08-23-16: wbc 17.3; hgb 9.9; hct 30.1; mcv 84.8; plt 93; glucose 79; bun 47; creat 2.57; k+ 5.4; na++ 147; liver normal albumin 2.4; free T4: 0.81; HIV: nr 09-06-16: glucose 112; bun 20; creat 1.70; k+ 4.0; na++ 136; liver normal albumin 2.3  09-07-16: wbc 9.0; hgb 8.2; hct 25.1; mcv 85.4; plt 566 09-16-16: wbc 11.;7 hgb 8.6; hct 26.4; mcv 85.4; plt 523; glucose 102; bun 30; creat 1.78; k+ 4.5; na++ 139; liver normal albumin 2.3; tsh 5.37; blood culture and urine culture: no growth 09-17-16: influenza A and B: neg 09-18-16: wbc 9.2; hgb 7.3; hct 23.1 mcv  86.8; plt 464; glucose 87; bun 20; creat 1.37; k+ 4.1; na++ 141 09-20-16; wbc 9.3; hgb 7.7; hct 24.1; mcv 88.0; plt 462; glucose 90; bun 20; creat 1.50; k+ 4.2; na++ 139  09-22-16: wbc 9.7; hgb 7.6; hct 24.5; mcv 86.6; plt 496; glucose 95; bun 17; creat 1.38; k+ 4.5; na++ 139     Review of Systems  Unable to perform ROS: Dementia     Physical Exam  Constitutional: No distress.  Eyes: Conjunctivae are normal.  Neck: Neck supple. No JVD present. No thyromegaly present.  Cardiovascular: Normal rate, regular rhythm and intact distal pulses.   Respiratory: Effort normal. No respiratory distress. He has no wheezes.  Rales rhonchi GI: Soft. Bowel sounds are normal. He exhibits no distension. There is no tenderness.  Musculoskeletal: He exhibits edema.  1+ lower extremity edema  Does not hold up head.   Lymphadenopathy:    He has no cervical adenopathy.  Neurological:  Not aware   Skin: Skin is warm and dry. He is diaphoretic  unstagable sacral ulceration with bleeding present and foul odor Bilateral heel blisters     ASSESSMENT/ PLAN:   1. Diastolic heart failure: will continue lasix 20 mg twice daily with k+ 10 meq daily will  monitor  2. DVT: is on long term xarelto 20 mg daily therapy will monitor   3.  Stage III chronic kidney disease: bun 17 /creat 1.38  4. Dementia: will continue aricept 10 mg daily and namenda 10 mg twice daily   5. Anemia: hgb 7.6; will monitor  6. Constipation: will continue miralax twice daily and senna  s 2 tabs nightly as needed  7. Depression with psychosis: will continue abilify 30 mg daily risperdal 1 mg nightly and 2 mg in the AM. Will continue ativan 0.5 mg every 8 hours as needed  8. Chronic pain: will continue neurontin 100 mg nightly   9. Dysphagia: is currently on thin liquids; will monitor  10. Sepsis due to HCAP and acute respiratory distress: will extend levaquin for a total of 10 days.will begin 02 at 2 liters.  . Will setup a palliative care consult.  He is running a fever today of 102. Will repeat chest  X-ray; cbc; cmp.   11. Sacral wound: will get wound culture: will treat with santyl oint and will monitor his status.    Time spent with patient  50  minutes >50% time spent counseling; reviewing medical record; tests; labs; and developing future plan of care     MD is aware of resident's narcotic use and is in agreement with current plan of care. We will attempt to wean resident as apropriate   Synthia Innocenteborah Janyiah Silveri NP [facility]  Contact [phone] Monday through Friday 8am- 5pm  After hours call [phone]

## 2016-09-27 NOTE — Telephone Encounter (Signed)
[facility] #: [phone] Fax#: [phone]  Reprinted due to on wrong paper.

## 2016-09-27 NOTE — Telephone Encounter (Signed)
[facility] #: [phone] Fax#: [phone]  

## 2016-10-03 NOTE — Progress Notes (Signed)
This encounter was created in error - please disregard.

## 2016-10-12 ENCOUNTER — Encounter: Payer: Self-pay | Admitting: Internal Medicine

## 2016-10-12 NOTE — Progress Notes (Signed)
This encounter was created in error - please disregard.

## 2016-10-18 ENCOUNTER — Encounter: Payer: Self-pay | Admitting: Adult Health

## 2016-10-18 ENCOUNTER — Non-Acute Institutional Stay (SKILLED_NURSING_FACILITY): Payer: Medicare Other | Admitting: Adult Health

## 2016-10-18 DIAGNOSIS — A419 Sepsis, unspecified organism: Secondary | ICD-10-CM

## 2016-10-18 DIAGNOSIS — L89154 Pressure ulcer of sacral region, stage 4: Secondary | ICD-10-CM | POA: Insufficient documentation

## 2016-10-18 DIAGNOSIS — I5032 Chronic diastolic (congestive) heart failure: Secondary | ICD-10-CM | POA: Diagnosis not present

## 2016-10-18 NOTE — Progress Notes (Signed)
Location:   Database administrator of Service:  SNF (31)   CODE STATUS: dnr  No Known Allergies  Chief Complaint  Patient presents with  . Acute Visit    end of life care     HPI:  He has had a change in his status. He is not responsive to stimuli. He is not able to take in po. He has not been able to take his medications. He is at end of life with hours to days left. His family has been notified.   Past Medical History:  Diagnosis Date  . Dementia 06/19/2014  . DVT (deep venous thrombosis) (HCC)    right leg  . Dyslipidemia   . Hypertension   . Mental retardation   . Obesity   . Other and unspecified hyperlipidemia 06/19/2014    No past surgical history on file.  Social History   Social History  . Marital status: Single    Spouse name: N/A  . Number of children: N/A  . Years of education: N/A   Occupational History  . Not on file.   Social History Main Topics  . Smoking status: Never Smoker  . Smokeless tobacco: Never Used  . Alcohol use No  . Drug use: No  . Sexual activity: Not on file   Other Topics Concern  . Not on file   Social History Narrative  . No narrative on file   Family History  Problem Relation Age of Onset  . Hypertension        VITAL SIGNS BP (!) 100/56   Pulse (!) 145   Temp 98.9 F (37.2 C)   Resp 20   Ht 5\' 11"  (1.803 m)   Wt 174 lb 12.8 oz (79.3 kg)   SpO2 93%   BMI 24.38 kg/m   Patient's Medications  New Prescriptions   No medications on file  Previous Medications   ACETAMINOPHEN (TYLENOL) 650 MG SUPPOSITORY    Place 1 suppository (650 mg total) rectally every 6 (six) hours as needed for mild pain (or Fever >/= 101).   AMINO ACIDS-PROTEIN HYDROLYS (FEEDING SUPPLEMENT, PRO-STAT SUGAR FREE 64,) LIQD    Take 30 mLs by mouth 2 (two) times daily.   ARIPIPRAZOLE (ABILIFY) 30 MG TABLET    Take 30 mg by mouth daily.   CHOLECALCIFEROL (VITAMIN D3) 1000 UNITS CAPS    Take 1 capsule by mouth 2 (two) times daily.     DONEPEZIL (ARICEPT) 10 MG TABLET    Take 10 mg by mouth at bedtime.    FUROSEMIDE (LASIX) 20 MG TABLET    Take 1 tablet (20 mg total) by mouth 2 (two) times daily.   GABAPENTIN (NEURONTIN) 100 MG CAPSULE    Take 100 mg by mouth at bedtime.     LEVOFLOXACIN (LEVAQUIN) 750 MG TABLET    Take 1 tablet (750 mg total) by mouth daily before breakfast.   LORAZEPAM (ATIVAN) 0.5 MG TABLET    Take 1 tablet (0.5 mg total) by mouth every 8 (eight) hours as needed (for agitation).   MEMANTINE (NAMENDA) 10 MG TABLET    Take 10 mg by mouth 2 (two) times daily.     MORPHINE SULFATE (MORPHINE CONCENTRATE) 10 MG / 0.5 ML CONCENTRATED SOLUTION    Take 5 mg by mouth every 3 (three) hours as needed for severe pain.   MULTIPLE VITAMINS-MINERALS (DECUBI-VITE PO)    Take 1 capsule by mouth daily.   POLYETHYLENE GLYCOL (MIRALAX / GLYCOLAX)  PACKET    Take 17 g by mouth 2 (two) times daily.   POTASSIUM CHLORIDE (K-DUR,KLOR-CON) 10 MEQ TABLET    Take 10 mEq by mouth daily.   RISPERIDONE (RISPERDAL) 1 MG TABLET    Take 1 tablet (1 mg total) by mouth at bedtime. Reported on 10/25/2015   RISPERIDONE (RISPERDAL) 2 MG TABLET    Take 1 tablet (2 mg total) by mouth daily with breakfast.   RIVAROXABAN (XARELTO) 20 MG TABS TABLET    Take 1 tablet (20 mg total) by mouth daily with supper.   SENNA-DOCUSATE (SENOKOT-S) 8.6-50 MG TABLET    Take 2 tablets by mouth at bedtime as needed for mild constipation or moderate constipation.   UNABLE TO FIND    Med Name: Med Pass 120 mL three times daily  Modified Medications   No medications on file  Discontinued Medications   No medications on file     SIGNIFICANT DIAGNOSTIC EXAMS    08-22-16: chest x-ray: Shallow inspiration with persistent atelectasis in the lung bases similar previous study. No developing consolidation.  08-26-16: chest x-ray: Interval improvement in bilateral airspace disease. Improvement in bibasilar atelectasis and right effusion Gas shadow overlying the right heart  border of uncertain etiology. Follow-up recommended. CT if the patient has had acute interval change.  08-29-16: swallow study: Dysphagia 2 (Fine chop) solids;Nectar thick liquid   LABS REVIEWED:   08-22-16: wbc 5.8; hgb 11.1; hct 34.0; mcv 84.6; plt 114; glucose 123; bun 44; creat 1.68; k+ 4.7; na++ 144; liver normal albumin 3.3; tsh 5.377; blood culture: no growth; urine culture: no growth.  08-23-16: wbc 17.3; hgb 9.9; hct 30.1; mcv 84.8; plt 93; glucose 79; bun 47; creat 2.57; k+ 5.4; na++ 147; liver normal albumin 2.4; free T4: 0.81; HIV: nr 09-06-16: glucose 112; bun 20; creat 1.70; k+ 4.0; na++ 136; liver normal albumin 2.3  09-07-16: wbc 9.0; hgb 8.2; hct 25.1; mcv 85.4; plt 566 09-16-16: wbc 11.;7 hgb 8.6; hct 26.4; mcv 85.4; plt 523; glucose 102; bun 30; creat 1.78; k+ 4.5; na++ 139; liver normal albumin 2.3; tsh 5.37; blood culture and urine culture: no growth 09-17-16: influenza A and B: neg 09-18-16: wbc 9.2; hgb 7.3; hct 23.1 mcv  86.8; plt 464; glucose 87; bun 20; creat 1.37; k+ 4.1; na++ 141 09-20-16; wbc 9.3; hgb 7.7; hct 24.1; mcv 88.0; plt 462; glucose 90; bun 20; creat 1.50; k+ 4.2; na++ 139  09-22-16: wbc 9.7; hgb 7.6; hct 24.5; mcv 86.6; plt 496; glucose 95; bun 17; creat 1.38; k+ 4.5; na++ 139     Review of Systems  Unable to perform ROS: Dementia     Physical Exam  Constitutional: No distress.  Eyes: Conjunctivae are normal.  Neck: Neck supple. No JVD present. No thyromegaly present.  Cardiovascular: Normal rate, regular rhythm and intact distal pulses.   Respiratory: Effort normal. No respiratory distress. He has no wheezes.  Rales rhonchi GI: Soft. Bowel sounds are normal. He exhibits no distension. There is no tenderness.  Musculoskeletal: He exhibits edema.  1+ lower extremity edema  Does not hold up head.   Lymphadenopathy:    He has no cervical adenopathy.  Neurological:  Not aware   Skin: Skin is warm and dry. He is diaphoretic  Left heel 100%  necrosis: 6.4 x 6.0 cm Right heel: 100% necrosis: 6.0 x 6.0 cm Right buttock: unstaged: 8.8 x 5.8 x 0.18 cm Sacrum ; unstaged: 8.5 x 6.8 x 0.28cm Wounds on buttock and sacrum with large  amount purulent foul drainage   ASSESSMENT/ PLAN:   1. Diastolic heart failure: 2.  Sepsis due to HCAP   3.Sacral wound:   Will stop all medications; will continue to use roxanol 5 mg every 3 hours as needed and will continue to monitor his status.    Time spent with patient  40   minutes >50% time spent counseling; reviewing medical record; tests; labs; and developing future plan of care      MD is aware of resident's narcotic use and is in agreement with current plan of care. We will attempt to wean resident as apropriate   Synthia Innocenteborah Yvett Rossel NP [facility]  Contact [phone] Monday through Friday 8am- 5pm  After hours call [phone]

## 2016-10-19 ENCOUNTER — Non-Acute Institutional Stay (SKILLED_NURSING_FACILITY): Payer: Medicare Other | Admitting: Internal Medicine

## 2016-10-19 ENCOUNTER — Encounter: Payer: Self-pay | Admitting: Internal Medicine

## 2016-10-19 DIAGNOSIS — R627 Adult failure to thrive: Secondary | ICD-10-CM

## 2016-10-19 DIAGNOSIS — I5032 Chronic diastolic (congestive) heart failure: Secondary | ICD-10-CM | POA: Diagnosis not present

## 2016-10-19 DIAGNOSIS — L89154 Pressure ulcer of sacral region, stage 4: Secondary | ICD-10-CM | POA: Diagnosis not present

## 2016-10-19 DIAGNOSIS — D638 Anemia in other chronic diseases classified elsewhere: Secondary | ICD-10-CM

## 2016-10-19 DIAGNOSIS — J189 Pneumonia, unspecified organism: Secondary | ICD-10-CM | POA: Diagnosis not present

## 2016-10-19 DIAGNOSIS — I82501 Chronic embolism and thrombosis of unspecified deep veins of right lower extremity: Secondary | ICD-10-CM

## 2016-10-19 DIAGNOSIS — F0150 Vascular dementia without behavioral disturbance: Secondary | ICD-10-CM

## 2016-10-19 DIAGNOSIS — N183 Chronic kidney disease, stage 3 unspecified: Secondary | ICD-10-CM

## 2016-10-19 DIAGNOSIS — A419 Sepsis, unspecified organism: Secondary | ICD-10-CM | POA: Diagnosis not present

## 2016-10-19 NOTE — Progress Notes (Signed)
Patient ID: [name], male   DOB: [birth year], 66 y.o.   MRN: [id number]    HISTORY AND PHYSICAL   DATE: 10/19/2016  Location:    [facility] Room Number: 154 B Place of Service: SNF (31)   Extended Emergency Contact Information Primary Emergency Contact: Howell,Ernestein  [place] Work Phone: [phone] Mobile Phone: [phone] Relation: Legal Guardian  Advanced Directive information Does Patient Have a Medical Advance Directive?: Yes, Type of Advance Directive: Out of facility DNR (pink MOST or yellow form), Pre-existing out of facility DNR order (yellow form or pink MOST form): Yellow form placed in chart (order not valid for inpatient use)  Chief Complaint  Patient presents with  . Readmit To SNF    HPI:  66 yo male long term resident seen today as a readmission into SNF following hospital stay for sepsis 2/2 HCAP, sacral wound, dementia, HTN, DVT of LE, CKD, mentally challenged, hematoma, AOCD. He presented to the ED with fever and decreased eating. CTA chest neg for PE but showed abnormality possible pneumonia. He was tx with IV vanco/cefepime-->po augmentin-->levaquin. Wound care followed satge 3 sacral decub. Albumin 2.1; Cr 1.55-->1.38; Hgb 7.6; plts 496K. He presents to SNF for long term care/comfort care.  Today he is nonverbal but does open eyes when name called. He has been running high Temps (Tm 104F)  per nursing. No falls. He is a poor historian due to dementia and mentally challenged. Hx obtained from chart. He is palliative care and is being kept comfortable. He no longer eats. All meds have been stopped except roxanol   Past Medical History:  Diagnosis Date  . Dementia 06/19/2014  . DVT (deep venous thrombosis) (HCC)    right leg  . Dyslipidemia   . Hypertension   . Mental retardation   . Obesity   . Other and unspecified hyperlipidemia 06/19/2014    History reviewed. No pertinent surgical history.  Patient Care  Team: Colon Branch, MD as PCP - General (Internal Medicine) Colon Branch, MD as Referring Physician (Internal Medicine)  Social History   Social History  . Marital status: Single    Spouse name: N/A  . Number of children: N/A  . Years of education: N/A   Occupational History  . Not on file.   Social History Main Topics  . Smoking status: Never Smoker  . Smokeless tobacco: Never Used  . Alcohol use No  . Drug use: No  . Sexual activity: Not on file   Other Topics Concern  . Not on file   Social History Narrative  . No narrative on file     reports that he has never smoked. He has never used smokeless tobacco. He reports that he does not drink alcohol or use drugs.  Family History  Problem Relation Age of Onset  . Hypertension     Family Status  Relation Status  . Mother Deceased  . Father Deceased  .      Immunization History  Administered Date(s) Administered  . Influenza-Unspecified 09/08/2016  . PPD Test 09/07/2016  . Pneumococcal Polysaccharide-23 12/12/2015    No Known Allergies  Medications: Patient's Medications  New Prescriptions   No medications on file  Previous Medications   ACETAMINOPHEN (TYLENOL) 650 MG SUPPOSITORY    Place 1 suppository (650 mg total) rectally every 6 (six) hours as needed for mild pain (or Fever >/= 101).   AMINO ACIDS-PROTEIN HYDROLYS (FEEDING SUPPLEMENT, PRO-STAT SUGAR FREE 64,) LIQD    Take  30 mLs by mouth 2 (two) times daily.   BISACODYL (DULCOLAX) 10 MG SUPPOSITORY    Place 10 mg rectally daily as needed for moderate constipation.   LORAZEPAM (ATIVAN) 0.5 MG TABLET    Take 1 tablet (0.5 mg total) by mouth every 8 (eight) hours as needed (for agitation).   METRONIDAZOLE (FLAGYL) 500 MG TABLET    Take 500 mg by mouth daily. Give 500 mg place into wound bed prior to dressing wound   MORPHINE SULFATE (MORPHINE CONCENTRATE) 10 MG / 0.5 ML CONCENTRATED SOLUTION    Take 5 mg by mouth every 3 (three) hours as needed for severe  pain.   MULTIPLE VITAMINS-MINERALS (DECUBI-VITE PO)    Take 1 capsule by mouth daily.   UNABLE TO FIND    Med Name: Med Pass 120 mL three times daily  Modified Medications   No medications on file  Discontinued Medications   ARIPIPRAZOLE (ABILIFY) 30 MG TABLET    Take 30 mg by mouth daily.   CHOLECALCIFEROL (VITAMIN D3) 1000 UNITS CAPS    Take 1 capsule by mouth 2 (two) times daily.     DONEPEZIL (ARICEPT) 10 MG TABLET    Take 10 mg by mouth at bedtime.    FUROSEMIDE (LASIX) 20 MG TABLET    Take 1 tablet (20 mg total) by mouth 2 (two) times daily.   GABAPENTIN (NEURONTIN) 100 MG CAPSULE    Take 100 mg by mouth at bedtime.     LEVOFLOXACIN (LEVAQUIN) 750 MG TABLET    Take 1 tablet (750 mg total) by mouth daily before breakfast.   MEMANTINE (NAMENDA) 10 MG TABLET    Take 10 mg by mouth 2 (two) times daily.     POLYETHYLENE GLYCOL (MIRALAX / GLYCOLAX) PACKET    Take 17 g by mouth 2 (two) times daily.   POTASSIUM CHLORIDE (K-DUR,KLOR-CON) 10 MEQ TABLET    Take 10 mEq by mouth daily.   RISPERIDONE (RISPERDAL) 1 MG TABLET    Take 1 tablet (1 mg total) by mouth at bedtime. Reported on 10/25/2015   RISPERIDONE (RISPERDAL) 2 MG TABLET    Take 1 tablet (2 mg total) by mouth daily with breakfast.   RIVAROXABAN (XARELTO) 20 MG TABS TABLET    Take 1 tablet (20 mg total) by mouth daily with supper.   SENNA-DOCUSATE (SENOKOT-S) 8.6-50 MG TABLET    Take 2 tablets by mouth at bedtime as needed for mild constipation or moderate constipation.    Review of Systems  Unable to perform ROS: Patient nonverbal    Vitals:   10/19/16 1027  BP: 140/70  Pulse: 78  Resp: 18  Temp: 98.9 F (37.2 C)  TempSrc: Oral  SpO2: 93%  Weight: 174 lb 12.8 oz (79.3 kg)  Height: 5\' 11"  (1.803 m)   Body mass index is 24.38 kg/m.  Physical Exam  Constitutional: He appears lethargic. He is sleeping. He has a sickly appearance.  Frail appearing in NAD, lying in bed  Cardiovascular: Regular rhythm.  Tachycardia present.    Murmur (1/6 SEM) heard. No LE edema b/l. No calf TTP  Pulmonary/Chest: No accessory muscle usage. No respiratory distress. He has decreased breath sounds (b/l at base). He has no wheezes. He has no rhonchi. He has rales.  Abdominal: Bowel sounds are decreased.  Neurological: He appears lethargic.  Skin:        Labs reviewed: Admission on 09/16/2016, Discharged on 09/23/2016  No results displayed because visit has over 200 results.  CBC  Latest Ref Rng & Units 09/22/2016 09/21/2016 09/20/2016  WBC 4.0 - 10.5 K/uL 9.7 10.4 9.3  Hemoglobin 13.0 - 17.0 g/dL 7.6(L) 8.0(L) 7.7(L)  Hematocrit 39.0 - 52.0 % 24.5(L) 25.7(L) 24.1(L)  Platelets 150 - 400 K/uL 496(H) 521(H) 462(H)   CMP Latest Ref Rng & Units 09/22/2016 09/21/2016 09/20/2016  Glucose 65 - 99 mg/dL 95 97 90  BUN 6 - 20 mg/dL 17 18 20   Creatinine 0.61 - 1.24 mg/dL 1.61(W) 9.60(A) 5.40(J)  Sodium 135 - 145 mmol/L 139 139 139  Potassium 3.5 - 5.1 mmol/L 4.5 4.6 4.2  Chloride 101 - 111 mmol/L 102 100(L) 101  CO2 22 - 32 mmol/L 30 30 29   Calcium 8.9 - 10.3 mg/dL 9.1 9.5 9.2  Total Protein 6.5 - 8.1 g/dL - - -  Total Bilirubin 0.3 - 1.2 mg/dL - - -  Alkaline Phos 38 - 126 U/L - - -  AST 15 - 41 U/L - - -  ALT 17 - 63 U/L - - -   No results found for: HGBA1C Lipid Panel  No results found for: CHOL, TRIG, HDL, CHOLHDL, VLDL, LDLCALC, LDLDIRECT    Admission on 08/22/2016, Discharged on 09/07/2016  No results displayed because visit has over 200 results.      Dg Chest Port 1 View  Result Date: 09/22/2016 CLINICAL DATA:  Hypoxia EXAM: PORTABLE CHEST 1 VIEW COMPARISON:  09/16/2016 FINDINGS: Examination limited by patient's chin over the right lung apex. Elevated right diaphragm. Increasing atelectasis or infiltrate at the lung bases. Possible tiny right effusion. Stable cardiomediastinal silhouette. No pneumothorax. IMPRESSION: Right lung apex is obscured by the patient's chin. Increasing bibasilar atelectasis or infiltrate.  Possible tiny right effusion. Stable cardiomegaly. Electronically Signed   By: Jasmine Pang M.D.   On: 09/22/2016 16:36     Assessment/Plan   ICD-9-CM ICD-10-CM   1. Sepsis due to pneumonia (HCC) 486 J18.9    995.91 A41.9    HCAP  2. Stage IV pressure ulcer of sacral region (HCC) 707.03 L89.154    707.24    3. Chronic deep vein thrombosis (DVT) of right lower extremity, unspecified vein (HCC) 453.50 I82.501   4. Chronic diastolic CHF (congestive heart failure) (HCC) 428.32 I50.32    428.0    5. Vascular dementia without behavioral disturbance 290.40 F01.50   6. Failure to thrive in adult 783.7 R62.7   7. Anemia of chronic disease 285.29 D63.8   8. CKD (chronic kidney disease) stage 3, GFR 30-59 ml/min 585.3 N18.3     Cont roxanol prn for comfort. All other meds have been stopped  May have pleasure foods as tolerated  Float heels  Wound care as indicated  GOAL: comfort care. Prognosis is poor. Communicated with nursing as pt is nonverbal.  Will follow  Kateryna Grantham S. Ancil Linsey  [facility] [address] [place], [place] [address] [phone] Cell (Monday-Friday 8 AM - 5 PM) [phone] After 5 PM and follow prompts

## 2016-11-11 DEATH — deceased

## 2017-06-05 IMAGING — CT CT ANGIO CHEST
3 of 7 series · 18 of 36 positions shown · IV contrast (Omni 300)
Comparison: Chest radiograph September 16, 2016 at 3353 hours

CLINICAL DATA: Failure to thrive for 3 days. Tachycardia and
tachypnea. History of deep vein thrombosis.

EXAM:
CT ANGIOGRAPHY CHEST WITH CONTRAST
TECHNIQUE: Multidetector CT imaging of the chest was performed using the
standard protocol during bolus administration of intravenous
contrast. Multiplanar CT image reconstructions and MIPs were
obtained to evaluate the vascular anatomy.
CONTRAST:  80 cc Isovue 370

[Series 5: pe thins · axial · 0.75mm/px · z∈[+1146,+1434]mm · 13 of 337 slices shown]
[im 25/337  lung]
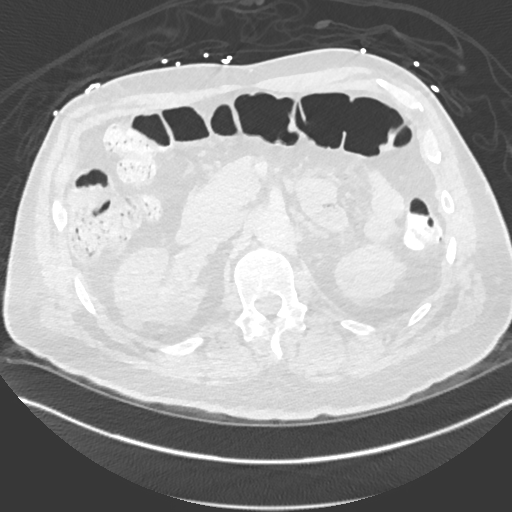
[im 49/337  mediastinal]
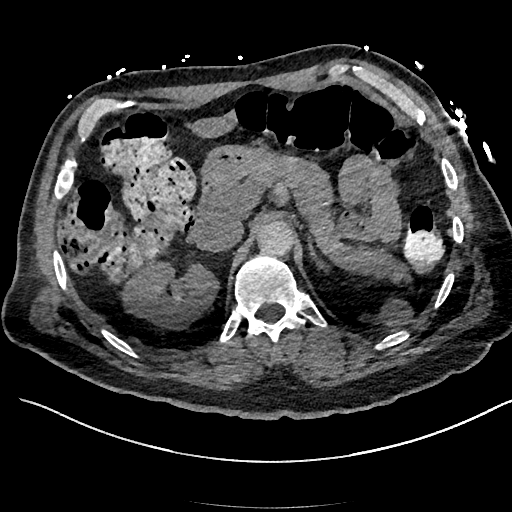
[im 73/337  lung]
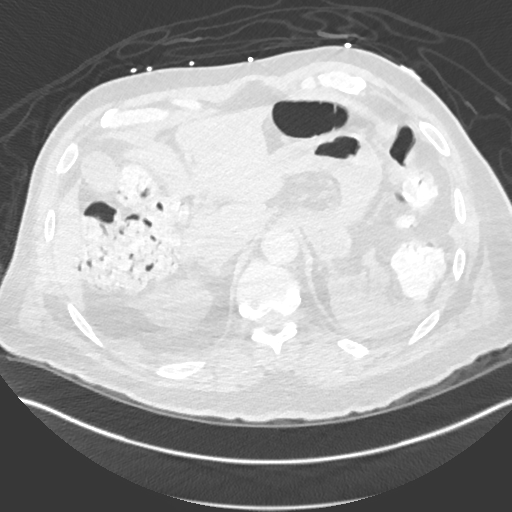
[im 97/337  mediastinal]
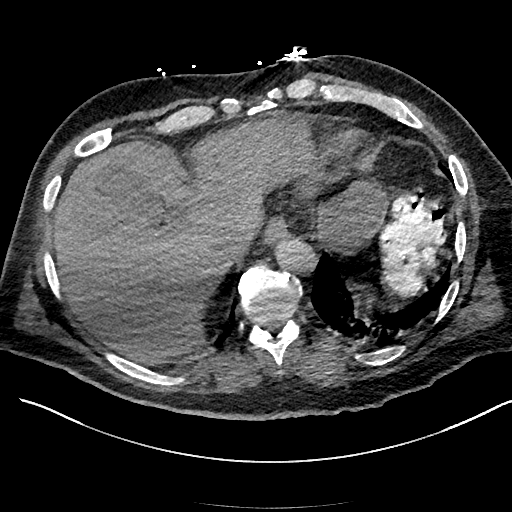
[im 121/337  lung]
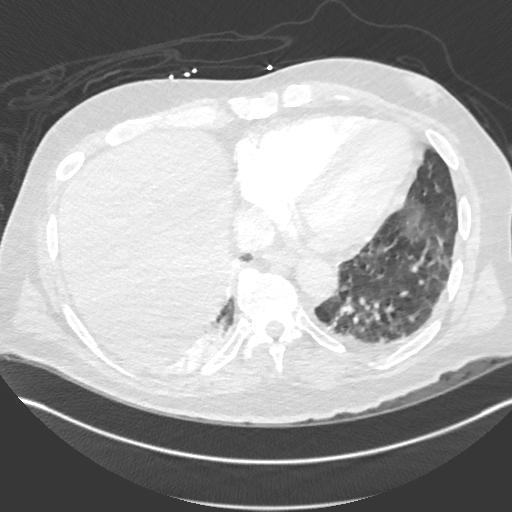
[im 145/337  mediastinal]
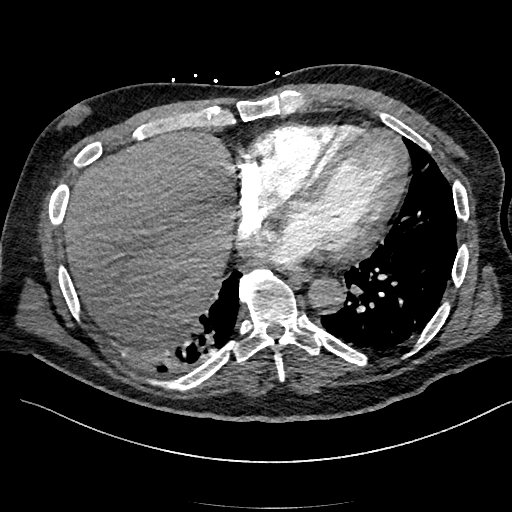
[im 169/337  lung]
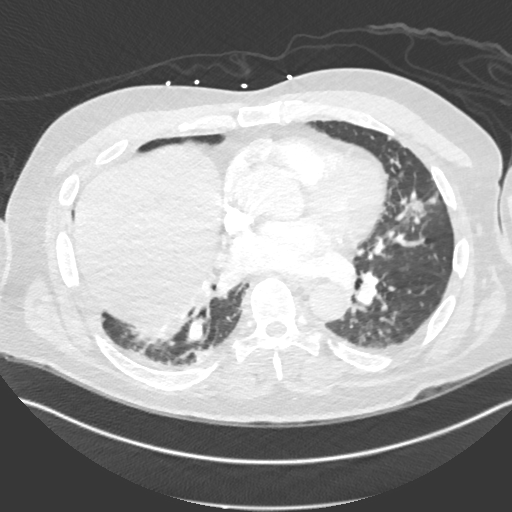
[im 193/337  mediastinal]
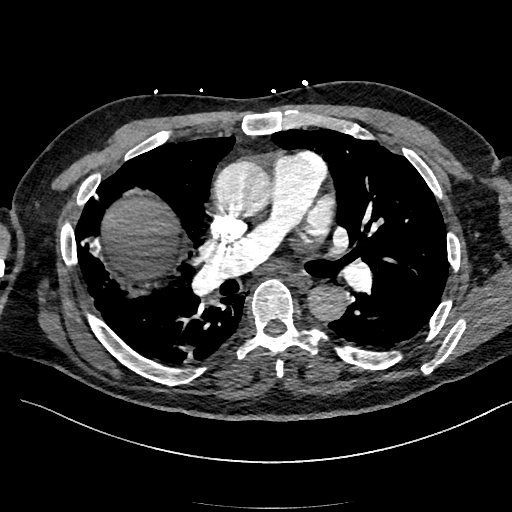
[im 217/337  lung]
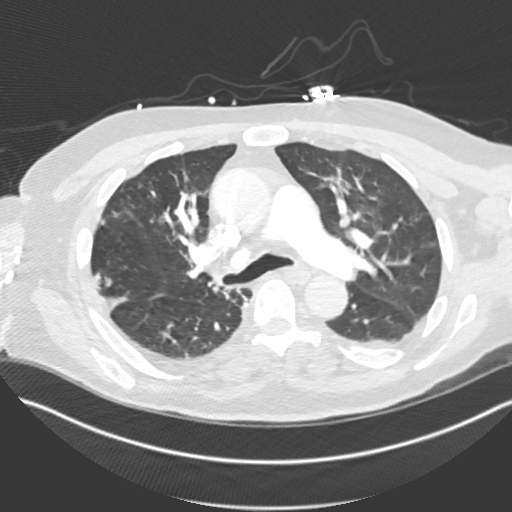
[im 241/337  mediastinal]
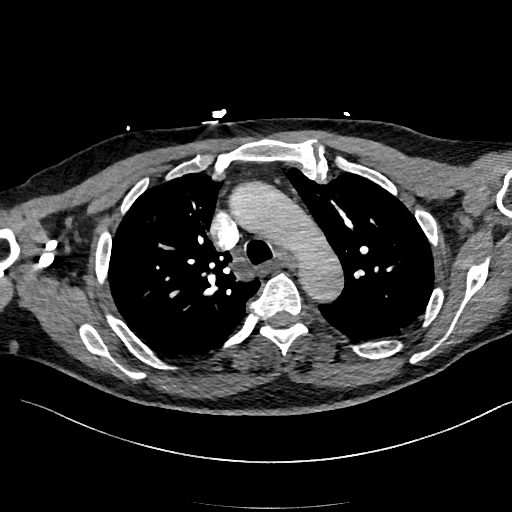
[im 265/337  lung]
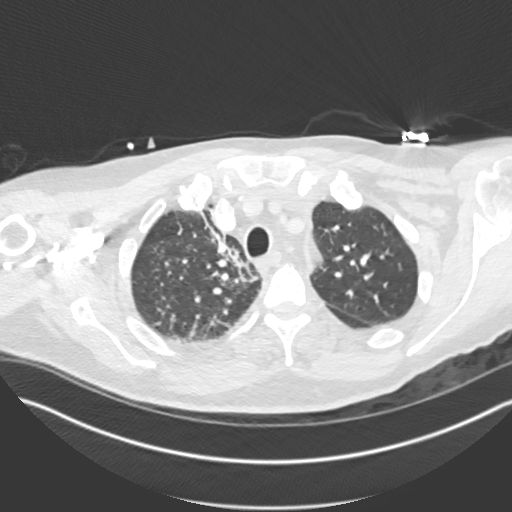
[im 289/337  mediastinal]
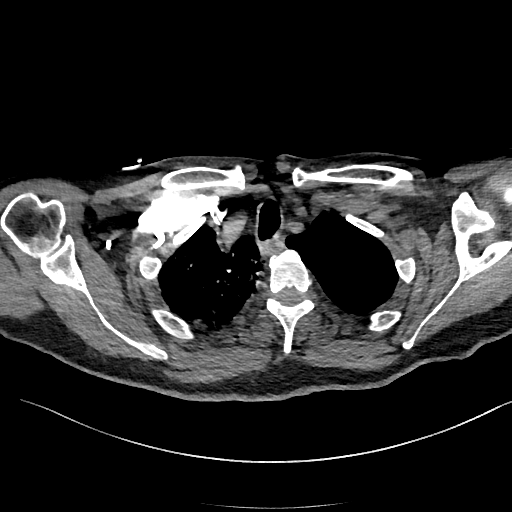
[im 313/337  lung]
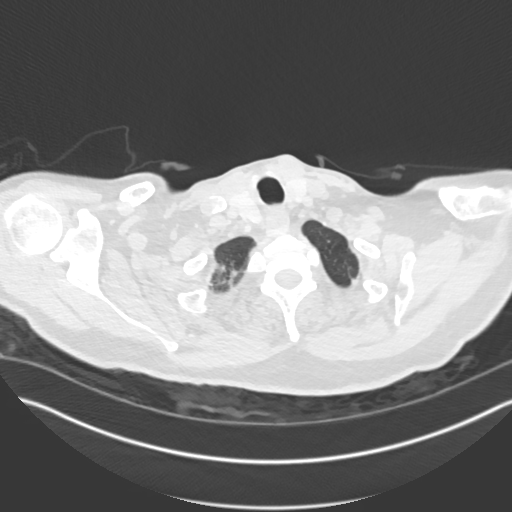

[Series 6: pe lung · axial · 0.75mm/px · z∈[+1244,+1404]mm · 4 of 134 slices shown]
[im 27/134  mediastinal]
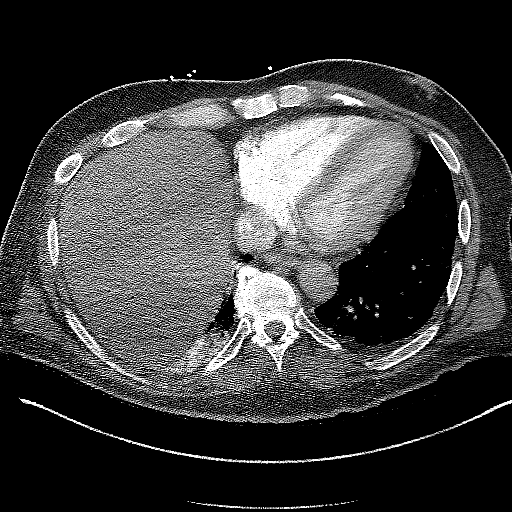
[im 54/134  mediastinal]
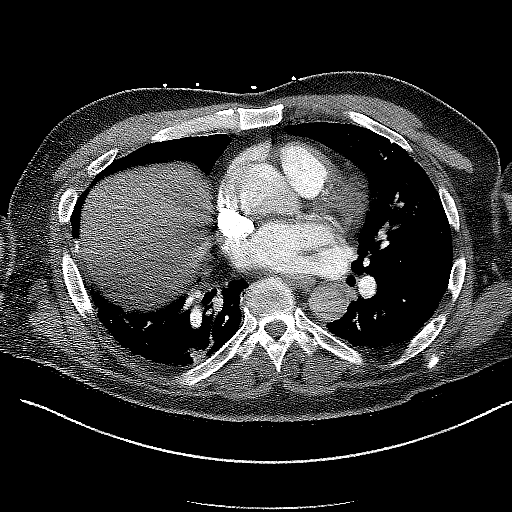
[im 80/134  mediastinal]
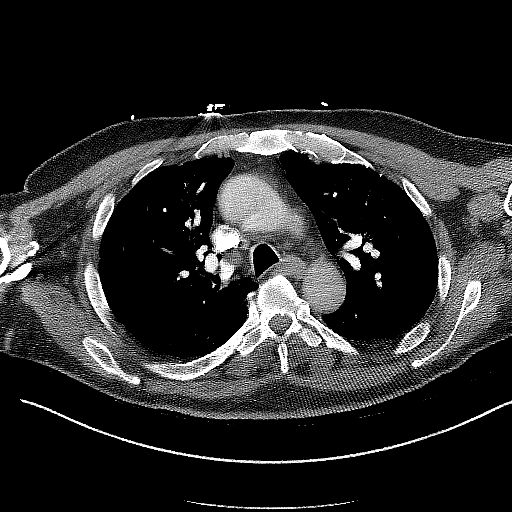
[im 107/134  mediastinal]
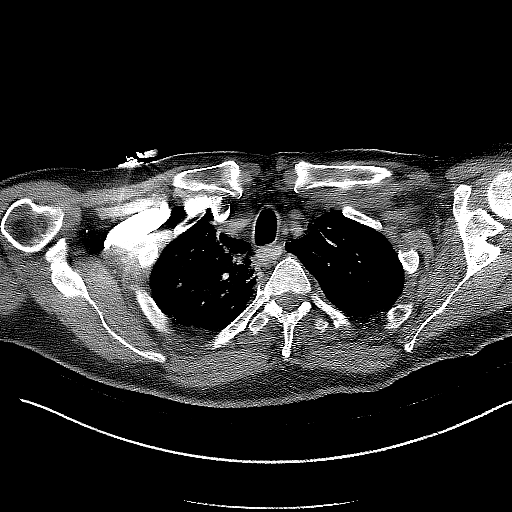

[Series 7: pe 2mm cor · coronal · 0.66mm/px · 1 of 125 slices shown]
[im 63/125  mediastinal]
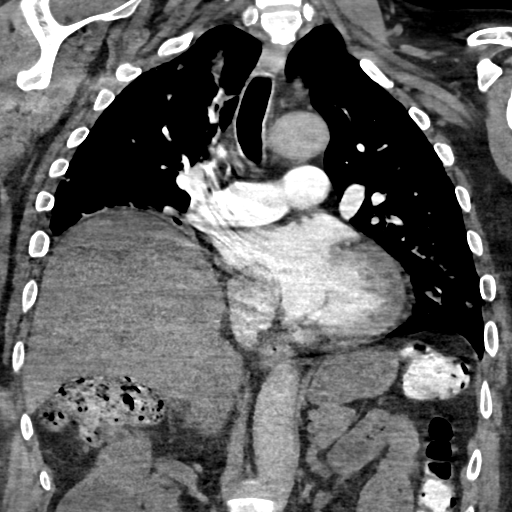

[18 of 36 positions shown; findings below may reference images not displayed]

FINDINGS: CARDIOVASCULAR: Adequate contrast opacification of the pulmonary
artery's. Main pulmonary artery is not enlarged. No pulmonary
arterial filling defects to the level of the subsegmental branches.
Heart size is normal, no right heart strain. Mild coronary artery
calcifications. No pericardial effusions. Thoracic aorta is normal
course and caliber, LEFT vertebral artery arises directly from the
aortic arch.

MEDIASTINUM/NODES: No lymphadenopathy by CT size criteria.

LUNGS/PLEURA: Tracheobronchial tree is patent, no pneumothorax.
RIGHT greater than LEFT tree-in-bud infiltrates. Heterogeneously
enhancing small consolidation. Enhancing RIGHT middle lobe
atelectasis. Bilateral scattered linear atelectasis or scarring. Ex

UPPER ABDOMEN: Included view of the abdomen is nonacute. Inferior
vena cava filter at the junction of the RIGHT renal vein. Partially
imaged RIGHT renal cyst measure least 5.7 cm. Moderate amount of
retained large bowel stool. Subcentimeter presumed cyst in dome of
the liver.

MUSCULOSKELETAL: Elevated RIGHT hemidiaphragm. Visualized soft
tissues and included osseous structure are s nonsuspicious.

Review of the MIP images confirms the above findings.
IMPRESSION: No acute pulmonary embolism.

Bilateral tree-in-bud infiltrates may be infectious or inflammatory.

RIGHT lung base enhancing atelectasis, possible superimposed
pneumonia.
